# Patient Record
Sex: Male | Born: 1940 | Race: Asian | Hispanic: No | Marital: Married | Smoking: Never smoker
Health system: Southern US, Community
[De-identification: ages and names within clinical notes are randomized; demographics above are authoritative.]

## PROBLEM LIST (undated history)

## (undated) DIAGNOSIS — I639 Cerebral infarction, unspecified: Secondary | ICD-10-CM

## (undated) DIAGNOSIS — F0390 Unspecified dementia without behavioral disturbance: Secondary | ICD-10-CM

## (undated) DIAGNOSIS — J189 Pneumonia, unspecified organism: Secondary | ICD-10-CM

## (undated) DIAGNOSIS — I10 Essential (primary) hypertension: Secondary | ICD-10-CM

---

## 2007-04-07 ENCOUNTER — Encounter: Payer: Self-pay | Admitting: Physical Medicine & Rehabilitation

## 2007-04-23 ENCOUNTER — Encounter: Payer: Self-pay | Admitting: Physical Medicine & Rehabilitation

## 2007-07-31 ENCOUNTER — Encounter: Payer: Self-pay | Admitting: Internal Medicine

## 2007-08-21 ENCOUNTER — Encounter: Payer: Self-pay | Admitting: Internal Medicine

## 2018-09-14 ENCOUNTER — Telehealth: Payer: Self-pay

## 2018-09-14 ENCOUNTER — Other Ambulatory Visit: Payer: Self-pay

## 2018-09-14 DIAGNOSIS — Z20822 Contact with and (suspected) exposure to covid-19: Secondary | ICD-10-CM

## 2018-09-14 NOTE — Telephone Encounter (Signed)
Incoming call from [facility] referring Pt for Covid -19 testing.  Call to Pt. Left message for Patient to call back to schedule appointment for Covided testing.

## 2018-09-14 NOTE — Telephone Encounter (Signed)
Incoming call from wife of pt.  Schedule appointment for 1230 today Thursday 09/14/18 .  Patient wife voices understanding.

## 2018-09-18 LAB — NOVEL CORONAVIRUS, NAA: SARS-CoV-2, NAA: NOT DETECTED

## 2020-11-03 DIAGNOSIS — R682 Dry mouth, unspecified: Secondary | ICD-10-CM | POA: Diagnosis not present

## 2020-11-03 DIAGNOSIS — R634 Abnormal weight loss: Secondary | ICD-10-CM | POA: Diagnosis not present

## 2020-11-03 DIAGNOSIS — I10 Essential (primary) hypertension: Secondary | ICD-10-CM | POA: Diagnosis not present

## 2020-11-03 DIAGNOSIS — M13849 Other specified arthritis, unspecified hand: Secondary | ICD-10-CM | POA: Diagnosis not present

## 2020-11-03 DIAGNOSIS — E785 Hyperlipidemia, unspecified: Secondary | ICD-10-CM | POA: Diagnosis not present

## 2020-11-03 DIAGNOSIS — R5381 Other malaise: Secondary | ICD-10-CM | POA: Diagnosis not present

## 2020-11-03 DIAGNOSIS — K219 Gastro-esophageal reflux disease without esophagitis: Secondary | ICD-10-CM | POA: Diagnosis not present

## 2020-11-03 DIAGNOSIS — M25569 Pain in unspecified knee: Secondary | ICD-10-CM | POA: Diagnosis not present

## 2020-11-26 DIAGNOSIS — R2689 Other abnormalities of gait and mobility: Secondary | ICD-10-CM | POA: Diagnosis not present

## 2020-12-09 DIAGNOSIS — M25569 Pain in unspecified knee: Secondary | ICD-10-CM | POA: Diagnosis not present

## 2020-12-09 DIAGNOSIS — R682 Dry mouth, unspecified: Secondary | ICD-10-CM | POA: Diagnosis not present

## 2020-12-09 DIAGNOSIS — E785 Hyperlipidemia, unspecified: Secondary | ICD-10-CM | POA: Diagnosis not present

## 2020-12-09 DIAGNOSIS — R5381 Other malaise: Secondary | ICD-10-CM | POA: Diagnosis not present

## 2020-12-09 DIAGNOSIS — M13849 Other specified arthritis, unspecified hand: Secondary | ICD-10-CM | POA: Diagnosis not present

## 2020-12-09 DIAGNOSIS — I951 Orthostatic hypotension: Secondary | ICD-10-CM | POA: Diagnosis not present

## 2020-12-18 DIAGNOSIS — R2689 Other abnormalities of gait and mobility: Secondary | ICD-10-CM | POA: Diagnosis not present

## 2020-12-22 DIAGNOSIS — R2689 Other abnormalities of gait and mobility: Secondary | ICD-10-CM | POA: Diagnosis not present

## 2021-01-01 DIAGNOSIS — R2689 Other abnormalities of gait and mobility: Secondary | ICD-10-CM | POA: Diagnosis not present

## 2021-04-28 DIAGNOSIS — E785 Hyperlipidemia, unspecified: Secondary | ICD-10-CM | POA: Diagnosis not present

## 2021-04-28 DIAGNOSIS — I10 Essential (primary) hypertension: Secondary | ICD-10-CM | POA: Diagnosis not present

## 2021-05-04 DIAGNOSIS — E785 Hyperlipidemia, unspecified: Secondary | ICD-10-CM | POA: Diagnosis not present

## 2021-05-04 DIAGNOSIS — M13849 Other specified arthritis, unspecified hand: Secondary | ICD-10-CM | POA: Diagnosis not present

## 2021-05-04 DIAGNOSIS — R682 Dry mouth, unspecified: Secondary | ICD-10-CM | POA: Diagnosis not present

## 2021-05-04 DIAGNOSIS — Z0001 Encounter for general adult medical examination with abnormal findings: Secondary | ICD-10-CM | POA: Diagnosis not present

## 2021-05-04 DIAGNOSIS — R634 Abnormal weight loss: Secondary | ICD-10-CM | POA: Diagnosis not present

## 2021-05-04 DIAGNOSIS — I10 Essential (primary) hypertension: Secondary | ICD-10-CM | POA: Diagnosis not present

## 2021-05-04 DIAGNOSIS — R0602 Shortness of breath: Secondary | ICD-10-CM | POA: Diagnosis not present

## 2021-05-04 DIAGNOSIS — G3184 Mild cognitive impairment, so stated: Secondary | ICD-10-CM | POA: Diagnosis not present

## 2021-05-04 DIAGNOSIS — K219 Gastro-esophageal reflux disease without esophagitis: Secondary | ICD-10-CM | POA: Diagnosis not present

## 2021-05-04 DIAGNOSIS — R5381 Other malaise: Secondary | ICD-10-CM | POA: Diagnosis not present

## 2021-05-04 DIAGNOSIS — M25569 Pain in unspecified knee: Secondary | ICD-10-CM | POA: Diagnosis not present

## 2021-06-19 DIAGNOSIS — N3000 Acute cystitis without hematuria: Secondary | ICD-10-CM | POA: Diagnosis not present

## 2021-06-19 DIAGNOSIS — Z8673 Personal history of transient ischemic attack (TIA), and cerebral infarction without residual deficits: Secondary | ICD-10-CM | POA: Diagnosis not present

## 2021-06-19 DIAGNOSIS — E7800 Pure hypercholesterolemia, unspecified: Secondary | ICD-10-CM | POA: Diagnosis not present

## 2021-06-19 DIAGNOSIS — Z79899 Other long term (current) drug therapy: Secondary | ICD-10-CM | POA: Diagnosis not present

## 2021-06-19 DIAGNOSIS — E860 Dehydration: Secondary | ICD-10-CM | POA: Diagnosis not present

## 2021-06-19 DIAGNOSIS — I10 Essential (primary) hypertension: Secondary | ICD-10-CM | POA: Diagnosis not present

## 2021-06-20 DIAGNOSIS — I10 Essential (primary) hypertension: Secondary | ICD-10-CM | POA: Diagnosis not present

## 2021-06-20 DIAGNOSIS — R5383 Other fatigue: Secondary | ICD-10-CM | POA: Diagnosis not present

## 2021-06-20 DIAGNOSIS — R509 Fever, unspecified: Secondary | ICD-10-CM | POA: Diagnosis not present

## 2021-06-20 DIAGNOSIS — E785 Hyperlipidemia, unspecified: Secondary | ICD-10-CM | POA: Diagnosis not present

## 2021-06-20 DIAGNOSIS — E860 Dehydration: Secondary | ICD-10-CM | POA: Diagnosis not present

## 2021-06-20 DIAGNOSIS — Z8673 Personal history of transient ischemic attack (TIA), and cerebral infarction without residual deficits: Secondary | ICD-10-CM | POA: Diagnosis not present

## 2021-06-20 DIAGNOSIS — R531 Weakness: Secondary | ICD-10-CM | POA: Diagnosis not present

## 2021-06-20 DIAGNOSIS — R2681 Unsteadiness on feet: Secondary | ICD-10-CM | POA: Diagnosis not present

## 2021-06-20 DIAGNOSIS — E7800 Pure hypercholesterolemia, unspecified: Secondary | ICD-10-CM | POA: Diagnosis not present

## 2021-06-20 DIAGNOSIS — R350 Frequency of micturition: Secondary | ICD-10-CM | POA: Diagnosis not present

## 2021-06-20 DIAGNOSIS — G9340 Encephalopathy, unspecified: Secondary | ICD-10-CM | POA: Diagnosis not present

## 2021-06-20 DIAGNOSIS — Z20822 Contact with and (suspected) exposure to covid-19: Secondary | ICD-10-CM | POA: Diagnosis not present

## 2021-06-20 DIAGNOSIS — N390 Urinary tract infection, site not specified: Secondary | ICD-10-CM | POA: Diagnosis not present

## 2021-06-20 DIAGNOSIS — Z1624 Resistance to multiple antibiotics: Secondary | ICD-10-CM | POA: Diagnosis not present

## 2021-06-20 DIAGNOSIS — D72829 Elevated white blood cell count, unspecified: Secondary | ICD-10-CM | POA: Diagnosis not present

## 2021-06-21 DIAGNOSIS — E785 Hyperlipidemia, unspecified: Secondary | ICD-10-CM | POA: Diagnosis not present

## 2021-06-21 DIAGNOSIS — Z7902 Long term (current) use of antithrombotics/antiplatelets: Secondary | ICD-10-CM | POA: Diagnosis not present

## 2021-06-21 DIAGNOSIS — R509 Fever, unspecified: Secondary | ICD-10-CM | POA: Diagnosis not present

## 2021-06-21 DIAGNOSIS — I10 Essential (primary) hypertension: Secondary | ICD-10-CM | POA: Diagnosis not present

## 2021-06-21 DIAGNOSIS — D72829 Elevated white blood cell count, unspecified: Secondary | ICD-10-CM | POA: Diagnosis not present

## 2021-06-21 DIAGNOSIS — N390 Urinary tract infection, site not specified: Secondary | ICD-10-CM | POA: Diagnosis not present

## 2021-06-21 DIAGNOSIS — Z792 Long term (current) use of antibiotics: Secondary | ICD-10-CM | POA: Diagnosis not present

## 2021-06-22 DIAGNOSIS — N390 Urinary tract infection, site not specified: Secondary | ICD-10-CM | POA: Diagnosis not present

## 2021-06-24 DIAGNOSIS — R634 Abnormal weight loss: Secondary | ICD-10-CM | POA: Diagnosis not present

## 2021-06-24 DIAGNOSIS — E785 Hyperlipidemia, unspecified: Secondary | ICD-10-CM | POA: Diagnosis not present

## 2021-06-24 DIAGNOSIS — R682 Dry mouth, unspecified: Secondary | ICD-10-CM | POA: Diagnosis not present

## 2021-06-24 DIAGNOSIS — M25569 Pain in unspecified knee: Secondary | ICD-10-CM | POA: Diagnosis not present

## 2021-06-24 DIAGNOSIS — R5381 Other malaise: Secondary | ICD-10-CM | POA: Diagnosis not present

## 2021-06-24 DIAGNOSIS — M13849 Other specified arthritis, unspecified hand: Secondary | ICD-10-CM | POA: Diagnosis not present

## 2021-07-07 DIAGNOSIS — N390 Urinary tract infection, site not specified: Secondary | ICD-10-CM | POA: Diagnosis not present

## 2021-07-23 ENCOUNTER — Encounter: Payer: Self-pay | Admitting: Urology

## 2021-07-23 ENCOUNTER — Ambulatory Visit (INDEPENDENT_AMBULATORY_CARE_PROVIDER_SITE_OTHER): Payer: Medicare Other | Admitting: Urology

## 2021-07-23 VITALS — BP 148/68 | HR 72 | Ht 69.0 in | Wt 145.0 lb

## 2021-07-23 DIAGNOSIS — R350 Frequency of micturition: Secondary | ICD-10-CM | POA: Diagnosis not present

## 2021-07-23 DIAGNOSIS — N401 Enlarged prostate with lower urinary tract symptoms: Secondary | ICD-10-CM

## 2021-07-23 DIAGNOSIS — N390 Urinary tract infection, site not specified: Secondary | ICD-10-CM | POA: Diagnosis not present

## 2021-07-23 LAB — MICROSCOPIC EXAMINATION

## 2021-07-23 LAB — URINALYSIS, COMPLETE
Bilirubin, UA: NEGATIVE
Glucose, UA: NEGATIVE
Ketones, UA: NEGATIVE
Nitrite, UA: NEGATIVE
Protein,UA: NEGATIVE
RBC, UA: NEGATIVE
Specific Gravity, UA: 1.025 (ref 1.005–1.030)
Urobilinogen, Ur: 0.2 mg/dL (ref 0.2–1.0)
pH, UA: 5.5 (ref 5.0–7.5)

## 2021-07-23 MED ORDER — SULFAMETHOXAZOLE-TRIMETHOPRIM 800-160 MG PO TABS: 1.0000 | ORAL_TABLET | Freq: Two times a day (BID) | ORAL | 0 refills | Status: AC

## 2021-07-24 LAB — BLADDER SCAN AMB NON-IMAGING: Scan Result: 8

## 2021-07-24 NOTE — Progress Notes (Signed)
? ?  07/23/2021 ?6:59 AM  ? ?[name] ?Aug 07, 1940 ?[id number] ? ?Referring provider: Margaretann Loveless, MD ?[address] ?[place],  [place] [address] ? ?Chief Complaint  ?Patient presents with  ? Urinary Frequency  ? ? ?HPI: ?Derek Yates is a 81 y.o. male referred for recurrent UTI/chronic prostatitis.  He presents today with his wife ? ?Seen [facility] ED 06/20/2021 with fever, fatigue and delirium.  Had been on Keflex for a UTI with findings consistent with UTI ?Was admitted and started on IV antibiotics.  Urine culture grew E. coli resistant to ampicillin, cefazolin, Keflex ?Discharged 06/22/2021 on Septra DS ?No complaints today.  Denies bothersome lower urinary tract symptoms.  States he has mild urinary frequency ?No dysuria, gross hematuria ?Denies flank, abdominal or pelvic pain ?On tamsulosin for BPH ? ?PMH: ?Hypertension ?Hyperlipidemia ?History of CVA ? ?Surgical History: ?History reviewed. No pertinent surgical history. ? ?Home Medications:  ?Allergies as of 07/23/2021   ?No Known Allergies ?  ? ?  ?Medication List  ?  ? ?  ? Accurate as of Jul 23, 2021 11:59 PM. If you have any questions, ask your nurse or doctor.  ?  ?  ? ?  ? ?atorvastatin 10 MG tablet ?Commonly known as: LIPITOR ?Take 10 mg by mouth daily. ?  ?diazepam 2 MG tablet ?Commonly known as: VALIUM ?Take 1 tablet by mouth daily. ?  ?NIFEdipine 30 MG 24 hr tablet ?Commonly known as: PROCARDIA-XL/NIFEDICAL-XL ?Take 1 tablet by mouth daily. ?  ?pantoprazole 40 MG tablet ?Commonly known as: PROTONIX ?Take by mouth. ?  ?sulfamethoxazole-trimethoprim 800-160 MG tablet ?Commonly known as: BACTRIM DS ?Take 1 tablet by mouth 2 (two) times daily for 14 days. ?Started by: Riki Altes, MD ?  ?tamsulosin 0.4 MG Caps capsule ?Commonly known as: FLOMAX ?Take by mouth. ?  ? ?  ? ? ?Allergies: No Known Allergies ? ?Family History: ?History reviewed. No pertinent family history. ? ?Social History:  reports that he has never smoked. He has never used smokeless  tobacco. No history on file for alcohol use and drug use. ? ? ?Physical Exam: ?BP (!) 148/68   Pulse 72   Ht 5\' 9"  (1.753 m)   Wt 145 lb (65.8 kg)   BMI 21.41 kg/m?   ?Constitutional:  Alert and oriented, No acute distress. ?HEENT: [place] AT, moist mucus membranes.  Trachea midline, no masses. ?Cardiovascular: No clubbing, cyanosis, or edema. ?Respiratory: Normal respiratory effort, no increased work of breathing. ?GI: Abdomen is soft, nontender, nondistended, no abdominal masses ?GU: Prostate 50 g, smooth without nodules ?Skin: No rashes, bruises or suspicious lesions. ?Psychiatric: Normal mood and affect. ? ?Laboratory Data: ? ?Urinalysis ?Microscopy 11-30 WBC ? ? ?Assessment & Plan:   ? ?1.  Recurrent UTI ?No upper tract imaging has been performed ?UA today with pyuria ?Urine culture was ordered and will start Septra DS with recent hospitalization secondary to UTI ?Renal ultrasound ordered  ?PVR today was 8cc ? ?2.  BPH with LUTS ?No bothersome voiding symptoms on tamsulosin ? ? ? , MD ? ?[facility] ?[address], [address] ?[place], [place] ?[phone] ? ?

## 2021-07-26 LAB — CULTURE, URINE COMPREHENSIVE

## 2021-07-31 ENCOUNTER — Ambulatory Visit: Payer: Medicare Other | Admitting: Urology

## 2021-08-12 ENCOUNTER — Ambulatory Visit
Admission: RE | Admit: 2021-08-12 | Discharge: 2021-08-12 | Disposition: A | Discharge status: Home / Self Care | Payer: Medicare Other | Source: Ambulatory Visit | Attending: Urology | Admitting: Urology

## 2021-08-12 DIAGNOSIS — N390 Urinary tract infection, site not specified: Secondary | ICD-10-CM | POA: Insufficient documentation

## 2021-08-12 DIAGNOSIS — N281 Cyst of kidney, acquired: Secondary | ICD-10-CM | POA: Diagnosis not present

## 2021-08-13 ENCOUNTER — Telehealth: Payer: Self-pay | Admitting: *Deleted

## 2021-08-13 NOTE — Telephone Encounter (Signed)
-----   Message from Riki Altes, MD sent at 08/13/2021 12:35 PM EDT ----- Renal ultrasound showed no findings that would be a source of urinary tract infection

## 2021-08-19 NOTE — Telephone Encounter (Signed)
Patient notified and voiced understanding.

## 2021-10-26 DIAGNOSIS — E785 Hyperlipidemia, unspecified: Secondary | ICD-10-CM | POA: Diagnosis not present

## 2021-10-26 DIAGNOSIS — K219 Gastro-esophageal reflux disease without esophagitis: Secondary | ICD-10-CM | POA: Diagnosis not present

## 2021-10-26 DIAGNOSIS — R0602 Shortness of breath: Secondary | ICD-10-CM | POA: Diagnosis not present

## 2021-10-26 DIAGNOSIS — I10 Essential (primary) hypertension: Secondary | ICD-10-CM | POA: Diagnosis not present

## 2021-10-26 DIAGNOSIS — M13849 Other specified arthritis, unspecified hand: Secondary | ICD-10-CM | POA: Diagnosis not present

## 2021-10-26 DIAGNOSIS — R634 Abnormal weight loss: Secondary | ICD-10-CM | POA: Diagnosis not present

## 2021-10-26 DIAGNOSIS — M25569 Pain in unspecified knee: Secondary | ICD-10-CM | POA: Diagnosis not present

## 2021-10-27 DIAGNOSIS — E559 Vitamin D deficiency, unspecified: Secondary | ICD-10-CM | POA: Diagnosis not present

## 2021-10-27 DIAGNOSIS — I10 Essential (primary) hypertension: Secondary | ICD-10-CM | POA: Diagnosis not present

## 2022-03-16 DIAGNOSIS — R634 Abnormal weight loss: Secondary | ICD-10-CM | POA: Diagnosis not present

## 2022-03-16 DIAGNOSIS — M13849 Other specified arthritis, unspecified hand: Secondary | ICD-10-CM | POA: Diagnosis not present

## 2022-03-16 DIAGNOSIS — R5381 Other malaise: Secondary | ICD-10-CM | POA: Diagnosis not present

## 2022-03-16 DIAGNOSIS — M25569 Pain in unspecified knee: Secondary | ICD-10-CM | POA: Diagnosis not present

## 2022-03-16 DIAGNOSIS — I10 Essential (primary) hypertension: Secondary | ICD-10-CM | POA: Diagnosis not present

## 2022-03-16 DIAGNOSIS — Z23 Encounter for immunization: Secondary | ICD-10-CM | POA: Diagnosis not present

## 2022-03-16 DIAGNOSIS — E785 Hyperlipidemia, unspecified: Secondary | ICD-10-CM | POA: Diagnosis not present

## 2022-04-28 ENCOUNTER — Ambulatory Visit: Payer: Medicare Other | Admitting: Internal Medicine

## 2022-05-25 ENCOUNTER — Other Ambulatory Visit: Payer: Self-pay

## 2022-05-25 MED ORDER — ATORVASTATIN CALCIUM 10 MG PO TABS: 10.0000 mg | ORAL_TABLET | Freq: Every day | ORAL | 1 refills | Status: DC

## 2022-06-01 ENCOUNTER — Ambulatory Visit (INDEPENDENT_AMBULATORY_CARE_PROVIDER_SITE_OTHER): Payer: Medicare Other | Admitting: Internal Medicine

## 2022-06-01 ENCOUNTER — Encounter: Payer: Self-pay | Admitting: Internal Medicine

## 2022-06-01 VITALS — BP 136/78 | HR 77 | Ht 67.0 in | Wt 147.6 lb

## 2022-06-01 DIAGNOSIS — L03312 Cellulitis of back [any part except buttock]: Secondary | ICD-10-CM

## 2022-06-01 DIAGNOSIS — E782 Mixed hyperlipidemia: Secondary | ICD-10-CM

## 2022-06-01 DIAGNOSIS — I63519 Cerebral infarction due to unspecified occlusion or stenosis of unspecified middle cerebral artery: Secondary | ICD-10-CM

## 2022-06-01 DIAGNOSIS — D171 Benign lipomatous neoplasm of skin and subcutaneous tissue of trunk: Secondary | ICD-10-CM | POA: Diagnosis not present

## 2022-06-01 DIAGNOSIS — F419 Anxiety disorder, unspecified: Secondary | ICD-10-CM

## 2022-06-01 MED ORDER — ATORVASTATIN CALCIUM 10 MG PO TABS: 10.0000 mg | ORAL_TABLET | Freq: Every day | ORAL | 1 refills | Status: DC

## 2022-06-01 MED ORDER — DIAZEPAM 2 MG PO TABS: 2.0000 mg | ORAL_TABLET | Freq: Every day | ORAL | 2 refills | Status: DC

## 2022-06-01 MED ORDER — DOXYCYCLINE HYCLATE 100 MG PO CAPS: 100.0000 mg | ORAL_CAPSULE | Freq: Two times a day (BID) | ORAL | 0 refills | Status: AC

## 2022-06-01 NOTE — Progress Notes (Signed)
Established Patient Office Visit  Subjective:  Patient ID: Derek Yates, male    DOB: [birth year]  Age: 82 y.o. MRN: [id number]  Chief Complaint  Patient presents with   Follow-up    Follow up     No new complaints is concerned about whether he can fast and an enlarging lesion on his back.      No past medical history on file.  No past surgical history on file.  Social History   Socioeconomic History   Marital status: Married    Spouse name: Not on file   Number of children: Not on file   Years of education: Not on file   Highest education level: Not on file  Occupational History   Not on file  Tobacco Use   Smoking status: Never   Smokeless tobacco: Never  Substance and Sexual Activity   Alcohol use: Not on file   Drug use: Not on file   Sexual activity: Not on file  Other Topics Concern   Not on file  Social History Narrative   Not on file   Social Determinants of Health   Financial Resource Strain: Not on file  Food Insecurity: Not on file  Transportation Needs: Not on file  Physical Activity: Not on file  Stress: Not on file  Social Connections: Not on file  Intimate Partner Violence: Not on file    No family history on file.  No Known Allergies  Review of Systems  All other systems reviewed and are negative.      Objective:   BP 136/78   Pulse 77   Ht '5\' 7"'$  (1.702 m)   Wt 147 lb 9.6 oz (67 kg)   SpO2 96%   BMI 23.12 kg/m   Vitals:   06/01/22 1142  BP: 136/78  Pulse: 77  Height: '5\' 7"'$  (1.702 m)  Weight: 147 lb 9.6 oz (67 kg)  SpO2: 96%  BMI (Calculated): 23.11    Physical Exam Vitals reviewed.  Constitutional:      Appearance: Normal appearance.  HENT:     Head: Normocephalic.     Left Ear: There is no impacted cerumen.     Nose: Nose normal.     Mouth/Throat:     Mouth: Mucous membranes are moist.     Pharynx: No posterior oropharyngeal erythema.  Eyes:     Extraocular Movements: Extraocular movements intact.      Pupils: Pupils are equal, round, and reactive to light.  Cardiovascular:     Rate and Rhythm: Regular rhythm.     Chest Wall: PMI is not displaced.     Pulses: Normal pulses.     Heart sounds: Normal heart sounds. No murmur heard. Pulmonary:     Effort: Pulmonary effort is normal.     Breath sounds: Normal air entry. No rhonchi or rales.  Abdominal:     General: Abdomen is flat. Bowel sounds are normal. There is no distension.     Palpations: Abdomen is soft. There is no hepatomegaly, splenomegaly or mass.     Tenderness: There is no abdominal tenderness.  Musculoskeletal:        General: Normal range of motion.     Cervical back: Normal range of motion and neck supple.     Right lower leg: No edema.     Left lower leg: No edema.  Skin:    General: Skin is warm and dry.     Comments: Lipoma rather inflamed  Neurological:  General: No focal deficit present.     Mental Status: He is alert and oriented to person, place, and time.     Cranial Nerves: No cranial nerve deficit.     Motor: No weakness.  Psychiatric:        Mood and Affect: Mood normal.        Behavior: Behavior normal.      No results found for any visits on 06/01/22.  No results found for this or any previous visit (from the past 2160 hour(Jariah Jarmon)).    Assessment & Plan:   Problem List Items Addressed This Visit   None   No follow-ups on file.   Total time spent: 20 minutes  Volanda Napoleon, MD  06/01/2022

## 2022-06-10 ENCOUNTER — Other Ambulatory Visit: Payer: Self-pay | Admitting: Internal Medicine

## 2022-07-06 ENCOUNTER — Ambulatory Visit: Payer: Medicare Other | Admitting: Nurse Practitioner

## 2022-07-08 ENCOUNTER — Other Ambulatory Visit: Payer: Self-pay | Admitting: Internal Medicine

## 2022-07-08 DIAGNOSIS — F419 Anxiety disorder, unspecified: Secondary | ICD-10-CM

## 2022-07-12 ENCOUNTER — Other Ambulatory Visit: Payer: Self-pay

## 2022-07-12 DIAGNOSIS — F419 Anxiety disorder, unspecified: Secondary | ICD-10-CM

## 2022-07-13 ENCOUNTER — Other Ambulatory Visit: Payer: Self-pay | Admitting: Internal Medicine

## 2022-07-13 DIAGNOSIS — F419 Anxiety disorder, unspecified: Secondary | ICD-10-CM

## 2022-07-13 NOTE — Telephone Encounter (Signed)
Patient requesting new Rx for diazepam.

## 2022-08-17 ENCOUNTER — Ambulatory Visit (INDEPENDENT_AMBULATORY_CARE_PROVIDER_SITE_OTHER): Payer: Medicare Other | Admitting: Internal Medicine

## 2022-08-17 ENCOUNTER — Encounter: Payer: Self-pay | Admitting: Internal Medicine

## 2022-08-17 VITALS — BP 130/80 | HR 66 | Ht 69.0 in | Wt 145.6 lb

## 2022-08-17 DIAGNOSIS — M25562 Pain in left knee: Secondary | ICD-10-CM | POA: Diagnosis not present

## 2022-08-17 DIAGNOSIS — R269 Unspecified abnormalities of gait and mobility: Secondary | ICD-10-CM | POA: Diagnosis not present

## 2022-08-17 DIAGNOSIS — B351 Tinea unguium: Secondary | ICD-10-CM

## 2022-08-17 DIAGNOSIS — W19XXXA Unspecified fall, initial encounter: Secondary | ICD-10-CM | POA: Diagnosis not present

## 2022-08-17 DIAGNOSIS — I63519 Cerebral infarction due to unspecified occlusion or stenosis of unspecified middle cerebral artery: Secondary | ICD-10-CM

## 2022-08-17 DIAGNOSIS — F419 Anxiety disorder, unspecified: Secondary | ICD-10-CM

## 2022-08-17 MED ORDER — DICLOFENAC SODIUM 1 % EX GEL: 8.0000 g | Freq: Two times a day (BID) | CUTANEOUS | 2 refills | Status: DC

## 2022-08-17 MED ORDER — DIAZEPAM 2 MG PO TABS: 2.0000 mg | ORAL_TABLET | Freq: Every day | ORAL | 0 refills | Status: DC

## 2022-08-17 MED ORDER — TERBINAFINE HCL 250 MG PO TABS: 250.0000 mg | ORAL_TABLET | Freq: Every day | ORAL | 0 refills | Status: AC

## 2022-08-17 NOTE — Progress Notes (Signed)
Established Patient Office Visit  Subjective:  Patient ID: Derek Yates, male    DOB: 1940-03-24  Age: 82 y.o. MRN: [id number]  Chief Complaint  Patient presents with   Follow-up    3 month follow up, discuss lab results.    C/o loss of balance and worsening left knee pain which is slightly ameloriated by knee sleeve. Also c/o memory loss. Denies any headaches or dizzziness. Larey Seat last week while trying to get out of bed and grazed his right elbow. C/o itching between his toes.     No other concerns at this time.   No past medical history on file.  No past surgical history on file.  Social History   Socioeconomic History   Marital status: Married    Spouse name: Not on file   Number of children: Not on file   Years of education: Not on file   Highest education level: Not on file  Occupational History   Not on file  Tobacco Use   Smoking status: Never   Smokeless tobacco: Never  Substance and Sexual Activity   Alcohol use: Not on file   Drug use: Not on file   Sexual activity: Not on file  Other Topics Concern   Not on file  Social History Narrative   Not on file   Social Determinants of Health   Financial Resource Strain: Not on file  Food Insecurity: Not on file  Transportation Needs: Not on file  Physical Activity: Not on file  Stress: Not on file  Social Connections: Not on file  Intimate Partner Violence: Not on file    No family history on file.  No Known Allergies  Review of Systems  All other systems reviewed and are negative.      Objective:   Pulse 66   Ht 5\' 9"  (1.753 m)   Wt 145 lb 9.6 oz (66 kg)   SpO2 95%   BMI 21.50 kg/m   Vitals:   08/17/22 1518  Pulse: 66  Height: 5\' 9"  (1.753 m)  Weight: 145 lb 9.6 oz (66 kg)  SpO2: 95%  BMI (Calculated): 21.49    Physical Exam Vitals reviewed.  Constitutional:      Appearance: Normal appearance.  HENT:     Head: Normocephalic.     Left Ear: There is no impacted cerumen.      Nose: Nose normal.     Mouth/Throat:     Mouth: Mucous membranes are moist.     Pharynx: No posterior oropharyngeal erythema.  Eyes:     Extraocular Movements: Extraocular movements intact.     Pupils: Pupils are equal, round, and reactive to light.  Cardiovascular:     Rate and Rhythm: Regular rhythm.     Chest Wall: PMI is not displaced.     Pulses: Normal pulses.     Heart sounds: Normal heart sounds. No murmur heard. Pulmonary:     Effort: Pulmonary effort is normal.     Breath sounds: Normal air entry. No rhonchi or rales.  Abdominal:     General: Abdomen is flat. Bowel sounds are normal. There is no distension.     Palpations: Abdomen is soft. There is no hepatomegaly, splenomegaly or mass.     Tenderness: There is no abdominal tenderness.  Musculoskeletal:        General: Normal range of motion.     Cervical back: Normal range of motion and neck supple.     Right lower leg: No edema.  Left lower leg: No edema.  Skin:    General: Skin is warm and dry.     Findings: Rash present. Rash is papular.     Comments: Purpura on right arm  Neurological:     General: No focal deficit present.     Mental Status: He is alert and oriented to person, place, and time.     Cranial Nerves: No cranial nerve deficit.     Motor: No weakness.  Psychiatric:        Mood and Affect: Mood normal.        Behavior: Behavior normal.      No results found for any visits on 08/17/22.  No results found for this or any previous visit (from the past 2160 hour(Marinell Igarashi)).    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   Total time spent: 20 minutes  Luna Fuse, MD  08/17/2022   This document may have been prepared by [facility] Voice Recognition software and as such may include unintentional dictation errors.

## 2022-08-19 ENCOUNTER — Ambulatory Visit (INDEPENDENT_AMBULATORY_CARE_PROVIDER_SITE_OTHER): Payer: Medicare Other

## 2022-08-19 ENCOUNTER — Other Ambulatory Visit: Payer: Medicare Other

## 2022-08-19 ENCOUNTER — Ambulatory Visit: Payer: Medicare Other

## 2022-08-19 DIAGNOSIS — M25561 Pain in right knee: Secondary | ICD-10-CM

## 2022-08-19 DIAGNOSIS — M25562 Pain in left knee: Secondary | ICD-10-CM | POA: Diagnosis not present

## 2022-08-25 ENCOUNTER — Telehealth: Payer: Self-pay | Admitting: Internal Medicine

## 2022-08-25 NOTE — Progress Notes (Signed)
Patient notified

## 2022-08-25 NOTE — Telephone Encounter (Signed)
Spoke with patient and he is wanting to do Home Health PT for his knees. Please send referral to [facility] for PT.

## 2022-08-27 ENCOUNTER — Telehealth: Payer: Self-pay

## 2022-08-27 NOTE — Telephone Encounter (Signed)
Patient Derek Yates stating that the [facility] agency can not see his order for PT

## 2022-08-31 NOTE — Telephone Encounter (Signed)
Gave patient and his wife the contact information to be able to get the hh set up

## 2022-09-01 ENCOUNTER — Ambulatory Visit: Payer: Medicare Other | Admitting: Internal Medicine

## 2022-09-10 ENCOUNTER — Telehealth: Payer: Self-pay

## 2022-09-10 DIAGNOSIS — R269 Unspecified abnormalities of gait and mobility: Secondary | ICD-10-CM

## 2022-09-10 NOTE — Telephone Encounter (Signed)
Patient son called asking that you call him back directly states that your family friends and he's got a couple questions  [phone]

## 2022-09-13 NOTE — Addendum Note (Signed)
Addended by: Aundria Mems AHMAD on: 09/13/2022 04:46 PM   Modules accepted: Orders

## 2022-09-16 DIAGNOSIS — R2689 Other abnormalities of gait and mobility: Secondary | ICD-10-CM | POA: Diagnosis not present

## 2022-09-16 DIAGNOSIS — I10 Essential (primary) hypertension: Secondary | ICD-10-CM | POA: Diagnosis not present

## 2022-09-16 DIAGNOSIS — Z9181 History of falling: Secondary | ICD-10-CM | POA: Diagnosis not present

## 2022-09-16 DIAGNOSIS — I69398 Other sequelae of cerebral infarction: Secondary | ICD-10-CM | POA: Diagnosis not present

## 2022-09-16 DIAGNOSIS — E785 Hyperlipidemia, unspecified: Secondary | ICD-10-CM | POA: Diagnosis not present

## 2022-09-21 DIAGNOSIS — Z9181 History of falling: Secondary | ICD-10-CM | POA: Diagnosis not present

## 2022-09-21 DIAGNOSIS — I69398 Other sequelae of cerebral infarction: Secondary | ICD-10-CM | POA: Diagnosis not present

## 2022-09-21 DIAGNOSIS — R2689 Other abnormalities of gait and mobility: Secondary | ICD-10-CM | POA: Diagnosis not present

## 2022-09-21 DIAGNOSIS — I10 Essential (primary) hypertension: Secondary | ICD-10-CM | POA: Diagnosis not present

## 2022-09-21 DIAGNOSIS — E785 Hyperlipidemia, unspecified: Secondary | ICD-10-CM | POA: Diagnosis not present

## 2022-09-27 DIAGNOSIS — I10 Essential (primary) hypertension: Secondary | ICD-10-CM | POA: Diagnosis not present

## 2022-09-27 DIAGNOSIS — Z9181 History of falling: Secondary | ICD-10-CM | POA: Diagnosis not present

## 2022-09-27 DIAGNOSIS — I69398 Other sequelae of cerebral infarction: Secondary | ICD-10-CM | POA: Diagnosis not present

## 2022-09-27 DIAGNOSIS — R2689 Other abnormalities of gait and mobility: Secondary | ICD-10-CM | POA: Diagnosis not present

## 2022-09-27 DIAGNOSIS — E785 Hyperlipidemia, unspecified: Secondary | ICD-10-CM | POA: Diagnosis not present

## 2022-09-29 ENCOUNTER — Ambulatory Visit: Payer: Medicare Other | Admitting: Internal Medicine

## 2022-09-29 ENCOUNTER — Telehealth: Payer: Self-pay | Admitting: Internal Medicine

## 2022-09-29 NOTE — Telephone Encounter (Signed)
Patient called to cancel appt for this morning.

## 2022-10-01 DIAGNOSIS — I69398 Other sequelae of cerebral infarction: Secondary | ICD-10-CM | POA: Diagnosis not present

## 2022-10-01 DIAGNOSIS — R2689 Other abnormalities of gait and mobility: Secondary | ICD-10-CM | POA: Diagnosis not present

## 2022-10-01 DIAGNOSIS — I10 Essential (primary) hypertension: Secondary | ICD-10-CM | POA: Diagnosis not present

## 2022-10-01 DIAGNOSIS — Z9181 History of falling: Secondary | ICD-10-CM | POA: Diagnosis not present

## 2022-10-01 DIAGNOSIS — E785 Hyperlipidemia, unspecified: Secondary | ICD-10-CM | POA: Diagnosis not present

## 2022-10-05 DIAGNOSIS — R2689 Other abnormalities of gait and mobility: Secondary | ICD-10-CM | POA: Diagnosis not present

## 2022-10-05 DIAGNOSIS — E785 Hyperlipidemia, unspecified: Secondary | ICD-10-CM | POA: Diagnosis not present

## 2022-10-05 DIAGNOSIS — I69398 Other sequelae of cerebral infarction: Secondary | ICD-10-CM | POA: Diagnosis not present

## 2022-10-05 DIAGNOSIS — Z9181 History of falling: Secondary | ICD-10-CM | POA: Diagnosis not present

## 2022-10-05 DIAGNOSIS — I10 Essential (primary) hypertension: Secondary | ICD-10-CM | POA: Diagnosis not present

## 2022-10-07 DIAGNOSIS — Z9181 History of falling: Secondary | ICD-10-CM | POA: Diagnosis not present

## 2022-10-07 DIAGNOSIS — I10 Essential (primary) hypertension: Secondary | ICD-10-CM | POA: Diagnosis not present

## 2022-10-07 DIAGNOSIS — E785 Hyperlipidemia, unspecified: Secondary | ICD-10-CM | POA: Diagnosis not present

## 2022-10-07 DIAGNOSIS — R2689 Other abnormalities of gait and mobility: Secondary | ICD-10-CM | POA: Diagnosis not present

## 2022-10-07 DIAGNOSIS — I69398 Other sequelae of cerebral infarction: Secondary | ICD-10-CM | POA: Diagnosis not present

## 2022-10-11 DIAGNOSIS — R2689 Other abnormalities of gait and mobility: Secondary | ICD-10-CM | POA: Diagnosis not present

## 2022-10-11 DIAGNOSIS — I69398 Other sequelae of cerebral infarction: Secondary | ICD-10-CM | POA: Diagnosis not present

## 2022-10-11 DIAGNOSIS — I10 Essential (primary) hypertension: Secondary | ICD-10-CM | POA: Diagnosis not present

## 2022-10-11 DIAGNOSIS — Z9181 History of falling: Secondary | ICD-10-CM | POA: Diagnosis not present

## 2022-10-11 DIAGNOSIS — E785 Hyperlipidemia, unspecified: Secondary | ICD-10-CM | POA: Diagnosis not present

## 2022-10-19 DIAGNOSIS — E785 Hyperlipidemia, unspecified: Secondary | ICD-10-CM | POA: Diagnosis not present

## 2022-10-19 DIAGNOSIS — R2689 Other abnormalities of gait and mobility: Secondary | ICD-10-CM | POA: Diagnosis not present

## 2022-10-19 DIAGNOSIS — Z9181 History of falling: Secondary | ICD-10-CM | POA: Diagnosis not present

## 2022-10-19 DIAGNOSIS — I10 Essential (primary) hypertension: Secondary | ICD-10-CM | POA: Diagnosis not present

## 2022-10-19 DIAGNOSIS — I69398 Other sequelae of cerebral infarction: Secondary | ICD-10-CM | POA: Diagnosis not present

## 2022-10-26 DIAGNOSIS — I10 Essential (primary) hypertension: Secondary | ICD-10-CM | POA: Diagnosis not present

## 2022-10-26 DIAGNOSIS — R2689 Other abnormalities of gait and mobility: Secondary | ICD-10-CM | POA: Diagnosis not present

## 2022-10-26 DIAGNOSIS — Z9181 History of falling: Secondary | ICD-10-CM | POA: Diagnosis not present

## 2022-10-26 DIAGNOSIS — E785 Hyperlipidemia, unspecified: Secondary | ICD-10-CM | POA: Diagnosis not present

## 2022-10-26 DIAGNOSIS — I69398 Other sequelae of cerebral infarction: Secondary | ICD-10-CM | POA: Diagnosis not present

## 2022-11-02 DIAGNOSIS — I69398 Other sequelae of cerebral infarction: Secondary | ICD-10-CM | POA: Diagnosis not present

## 2022-11-02 DIAGNOSIS — R2689 Other abnormalities of gait and mobility: Secondary | ICD-10-CM | POA: Diagnosis not present

## 2022-11-02 DIAGNOSIS — I10 Essential (primary) hypertension: Secondary | ICD-10-CM | POA: Diagnosis not present

## 2022-11-02 DIAGNOSIS — Z9181 History of falling: Secondary | ICD-10-CM | POA: Diagnosis not present

## 2022-11-02 DIAGNOSIS — E785 Hyperlipidemia, unspecified: Secondary | ICD-10-CM | POA: Diagnosis not present

## 2022-11-10 DIAGNOSIS — I69398 Other sequelae of cerebral infarction: Secondary | ICD-10-CM | POA: Diagnosis not present

## 2022-11-10 DIAGNOSIS — I10 Essential (primary) hypertension: Secondary | ICD-10-CM | POA: Diagnosis not present

## 2022-11-10 DIAGNOSIS — E785 Hyperlipidemia, unspecified: Secondary | ICD-10-CM | POA: Diagnosis not present

## 2022-11-10 DIAGNOSIS — R2689 Other abnormalities of gait and mobility: Secondary | ICD-10-CM | POA: Diagnosis not present

## 2022-11-10 DIAGNOSIS — Z9181 History of falling: Secondary | ICD-10-CM | POA: Diagnosis not present

## 2022-11-16 DIAGNOSIS — Z9181 History of falling: Secondary | ICD-10-CM | POA: Diagnosis not present

## 2022-11-16 DIAGNOSIS — I69398 Other sequelae of cerebral infarction: Secondary | ICD-10-CM | POA: Diagnosis not present

## 2022-11-16 DIAGNOSIS — R2689 Other abnormalities of gait and mobility: Secondary | ICD-10-CM | POA: Diagnosis not present

## 2022-11-16 DIAGNOSIS — I10 Essential (primary) hypertension: Secondary | ICD-10-CM | POA: Diagnosis not present

## 2022-11-16 DIAGNOSIS — E785 Hyperlipidemia, unspecified: Secondary | ICD-10-CM | POA: Diagnosis not present

## 2022-11-17 ENCOUNTER — Other Ambulatory Visit: Payer: Self-pay | Admitting: Internal Medicine

## 2022-11-17 DIAGNOSIS — E782 Mixed hyperlipidemia: Secondary | ICD-10-CM

## 2022-11-23 DIAGNOSIS — R2689 Other abnormalities of gait and mobility: Secondary | ICD-10-CM | POA: Diagnosis not present

## 2022-11-23 DIAGNOSIS — I69398 Other sequelae of cerebral infarction: Secondary | ICD-10-CM | POA: Diagnosis not present

## 2022-11-23 DIAGNOSIS — I10 Essential (primary) hypertension: Secondary | ICD-10-CM | POA: Diagnosis not present

## 2022-11-23 DIAGNOSIS — Z9181 History of falling: Secondary | ICD-10-CM | POA: Diagnosis not present

## 2022-11-23 DIAGNOSIS — E785 Hyperlipidemia, unspecified: Secondary | ICD-10-CM | POA: Diagnosis not present

## 2023-01-08 ENCOUNTER — Other Ambulatory Visit: Payer: Self-pay | Admitting: Internal Medicine

## 2023-01-08 DIAGNOSIS — F419 Anxiety disorder, unspecified: Secondary | ICD-10-CM

## 2023-01-11 ENCOUNTER — Encounter: Payer: Self-pay | Admitting: Internal Medicine

## 2023-01-11 ENCOUNTER — Ambulatory Visit (INDEPENDENT_AMBULATORY_CARE_PROVIDER_SITE_OTHER): Payer: Medicare Other | Admitting: Internal Medicine

## 2023-01-11 VITALS — BP 121/78 | HR 70 | Ht 69.0 in | Wt 147.4 lb

## 2023-01-11 DIAGNOSIS — Z23 Encounter for immunization: Secondary | ICD-10-CM

## 2023-01-11 DIAGNOSIS — I63519 Cerebral infarction due to unspecified occlusion or stenosis of unspecified middle cerebral artery: Secondary | ICD-10-CM

## 2023-01-11 NOTE — Progress Notes (Signed)
Established Patient Office Visit  Subjective:  Patient ID: Derek Yates, male    DOB: [birth year]  Age: 82 y.o. MRN: [id number]  Chief Complaint  Patient presents with   Follow-up    Memory loss    No new complaints, here for lab review and medication refills. Family concerned about his memory. Still report poor balance and failed to improve Margia Wiesen/p Home PT.    No other concerns at this time.   No past medical history on file.  No past surgical history on file.  Social History   Socioeconomic History   Marital status: Married    Spouse name: Not on file   Number of children: Not on file   Years of education: Not on file   Highest education level: Not on file  Occupational History   Not on file  Tobacco Use   Smoking status: Never   Smokeless tobacco: Never  Substance and Sexual Activity   Alcohol use: Not on file   Drug use: Not on file   Sexual activity: Not on file  Other Topics Concern   Not on file  Social History Narrative   Not on file   Social Determinants of Health   Financial Resource Strain: Low Risk  (06/20/2021)   Received from [facility], [facility]   Overall Financial Resource Strain (CARDIA)    Difficulty of Paying Living Expenses: Not hard at all  Food Insecurity: No Food Insecurity (06/20/2021)   Received from [facility], [facility]   Hunger Vital Sign    Worried About Running Out of Food in the Last Year: Never true    Ran Out of Food in the Last Year: Never true  Transportation Needs: No Transportation Needs (06/20/2021)   Received from [facility], [facility]   [facility] - Transportation    Lack of Transportation (Medical): No    Lack of Transportation (Non-Medical): No  Physical Activity: Not on file  Stress: Not on file  Social Connections: Not on file  Intimate Partner Violence: Not on file    No family history on file.  No Known Allergies  Review of Systems  Psychiatric/Behavioral:  Positive for memory  loss.   All other systems reviewed and are negative.      Objective:   BP 121/78   Pulse 70   Ht 5\' 9"  (1.753 m)   Wt 147 lb 6.4 oz (66.9 kg)   SpO2 96%   BMI 21.77 kg/m   Vitals:   01/11/23 1430  BP: 121/78  Pulse: 70  Height: 5\' 9"  (1.753 m)  Weight: 147 lb 6.4 oz (66.9 kg)  SpO2: 96%  BMI (Calculated): 21.76    Physical Exam Vitals reviewed.  Constitutional:      Appearance: Normal appearance.  HENT:     Head: Normocephalic.     Left Ear: There is no impacted cerumen.     Nose: Nose normal.     Mouth/Throat:     Mouth: Mucous membranes are moist.     Pharynx: No posterior oropharyngeal erythema.  Eyes:     Extraocular Movements: Extraocular movements intact.     Pupils: Pupils are equal, round, and reactive to light.  Cardiovascular:     Rate and Rhythm: Regular rhythm.     Chest Wall: PMI is not displaced.     Pulses: Normal pulses.     Heart sounds: Normal heart sounds. No murmur heard. Pulmonary:     Effort:  Pulmonary effort is normal.     Breath sounds: Normal air entry. No rhonchi or rales.  Abdominal:     General: Abdomen is flat. Bowel sounds are normal. There is no distension.     Palpations: Abdomen is soft. There is no hepatomegaly, splenomegaly or mass.     Tenderness: There is no abdominal tenderness.  Musculoskeletal:        General: Normal range of motion.     Cervical back: Normal range of motion and neck supple.     Right lower leg: No edema.     Left lower leg: No edema.  Skin:    General: Skin is warm and dry.     Findings: Rash present. Rash is papular.     Comments: Purpura on right arm  Neurological:     General: No focal deficit present.     Mental Status: He is alert and oriented to person, place, and time.     Cranial Nerves: No cranial nerve deficit.     Motor: No weakness.  Psychiatric:        Mood and Affect: Mood normal.        Behavior: Behavior normal.      No results found for any visits on 01/11/23.  No  results found for this or any previous visit (from the past 2160 hour(Cray Monnin)).    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   Total time spent: 20 minutes  Luna Fuse, MD  01/11/2023   This document may have been prepared by [facility] Voice Recognition software and as such may include unintentional dictation errors.

## 2023-01-11 NOTE — Addendum Note (Signed)
Addended by: Hassell Halim on: 01/11/2023 03:11 PM   Modules accepted: Orders

## 2023-02-03 ENCOUNTER — Other Ambulatory Visit: Payer: Medicare Other

## 2023-02-03 ENCOUNTER — Other Ambulatory Visit: Payer: Self-pay

## 2023-02-03 DIAGNOSIS — I63519 Cerebral infarction due to unspecified occlusion or stenosis of unspecified middle cerebral artery: Secondary | ICD-10-CM | POA: Diagnosis not present

## 2023-02-04 LAB — CBC WITH DIFF/PLATELET
Basophils Absolute: 0.1 10*3/uL (ref 0.0–0.2)
Basos: 1 %
EOS (ABSOLUTE): 0.3 10*3/uL (ref 0.0–0.4)
Eos: 4 %
Hematocrit: 39.9 % (ref 37.5–51.0)
Hemoglobin: 13.3 g/dL (ref 13.0–17.7)
Immature Grans (Abs): 0 10*3/uL (ref 0.0–0.1)
Immature Granulocytes: 0 %
Lymphocytes Absolute: 2.6 10*3/uL (ref 0.7–3.1)
Lymphs: 37 %
MCH: 29 pg (ref 26.6–33.0)
MCHC: 33.3 g/dL (ref 31.5–35.7)
MCV: 87 fL (ref 79–97)
Monocytes Absolute: 0.7 10*3/uL (ref 0.1–0.9)
Monocytes: 10 %
Neutrophils Absolute: 3.5 10*3/uL (ref 1.4–7.0)
Neutrophils: 48 %
Platelets: 271 10*3/uL (ref 150–450)
RBC: 4.59 x10E6/uL (ref 4.14–5.80)
RDW: 13.2 % (ref 11.6–15.4)
WBC: 7.1 10*3/uL (ref 3.4–10.8)

## 2023-02-04 LAB — LIPID PANEL
Chol/HDL Ratio: 2.5 ratio (ref 0.0–5.0)
Cholesterol, Total: 143 mg/dL (ref 100–199)
HDL: 57 mg/dL (ref >39)
LDL Chol Calc (NIH): 66 mg/dL (ref 0–99)
Triglycerides: 110 mg/dL (ref 0–149)
VLDL Cholesterol Cal: 20 mg/dL (ref 5–40)

## 2023-02-04 LAB — COMPREHENSIVE METABOLIC PANEL
ALT: 14 [IU]/L (ref 0–44)
AST: 18 [IU]/L (ref 0–40)
Albumin: 4.5 g/dL (ref 3.7–4.7)
Alkaline Phosphatase: 96 [IU]/L (ref 44–121)
BUN/Creatinine Ratio: 15 (ref 10–24)
BUN: 15 mg/dL (ref 8–27)
Bilirubin Total: 0.5 mg/dL (ref 0.0–1.2)
CO2: 25 mmol/L (ref 20–29)
Calcium: 9.8 mg/dL (ref 8.6–10.2)
Chloride: 102 mmol/L (ref 96–106)
Creatinine, Ser: 0.98 mg/dL (ref 0.76–1.27)
Globulin, Total: 2.9 g/dL (ref 1.5–4.5)
Glucose: 89 mg/dL (ref 70–99)
Potassium: 4.1 mmol/L (ref 3.5–5.2)
Sodium: 143 mmol/L (ref 134–144)
Total Protein: 7.4 g/dL (ref 6.0–8.5)
eGFR: 76 mL/min/{1.73_m2} (ref >59)

## 2023-02-07 ENCOUNTER — Ambulatory Visit (INDEPENDENT_AMBULATORY_CARE_PROVIDER_SITE_OTHER): Payer: Medicare Other | Admitting: Internal Medicine

## 2023-02-07 ENCOUNTER — Encounter: Payer: Self-pay | Admitting: Internal Medicine

## 2023-02-07 VITALS — BP 130/75 | HR 75 | Ht 68.0 in | Wt 146.6 lb

## 2023-02-07 DIAGNOSIS — G3184 Mild cognitive impairment, so stated: Secondary | ICD-10-CM | POA: Diagnosis not present

## 2023-02-07 DIAGNOSIS — Z0001 Encounter for general adult medical examination with abnormal findings: Secondary | ICD-10-CM | POA: Diagnosis not present

## 2023-02-07 DIAGNOSIS — Z0130 Encounter for examination of blood pressure without abnormal findings: Secondary | ICD-10-CM

## 2023-02-07 DIAGNOSIS — M25562 Pain in left knee: Secondary | ICD-10-CM

## 2023-02-07 MED ORDER — DICLOFENAC SODIUM 1 % EX GEL: 8.0000 g | Freq: Two times a day (BID) | CUTANEOUS | 2 refills | Status: DC

## 2023-02-07 NOTE — Progress Notes (Signed)
Established Patient Office Visit  Subjective:  Patient ID: Derek Yates, male    DOB: 1940-06-14  Age: 82 y.o. MRN: [id number]  Chief Complaint  Patient presents with   Annual Exam    Pain in both knees Memory loss    No new complaints, here for AWV refer to quality metrics and scanned documents.   Wife and patient report deteriorating memory, he is [place] in 2-3 w[place] and would like to start medication before then if necessary. Labs reviewed and notable for well controlled lipids and unremarkable cbc and cmp.  No other concerns at this time.   No past medical history on file.  No past surgical history on file.  Social History   Socioeconomic History   Marital status: Married    Spouse name: Not on file   Number of children: Not on file   Years of education: Not on file   Highest education level: Not on file  Occupational History   Not on file  Tobacco Use   Smoking status: Never   Smokeless tobacco: Never  Substance and Sexual Activity   Alcohol use: Not on file   Drug use: Not on file   Sexual activity: Not on file  Other Topics Concern   Not on file  Social History Narrative   Not on file   Social Determinants of Health   Financial Resource Strain: Low Risk  (06/20/2021)   Received from [facility], [facility]   Overall Financial Resource Strain (CARDIA)    Difficulty of Paying Living Expenses: Not hard at all  Food Insecurity: No Food Insecurity (06/20/2021)   Received from [facility], [facility]   Hunger Vital Sign    Worried About Running Out of Food in the Last Year: Never true    Ran Out of Food in the Last Year: Never true  Transportation Needs: No Transportation Needs (06/20/2021)   Received from [facility], [facility]   [facility] - Transportation    Lack of Transportation (Medical): No    Lack of Transportation (Non-Medical): No  Physical Activity: Not on file  Stress: Not on file  Social Connections: Not on  file  Intimate Partner Violence: Not on file    No family history on file.  No Known Allergies  Outpatient Medications Prior to Visit  Medication Sig   atorvastatin (LIPITOR) 10 MG tablet TAKE 1 TABLET BY MOUTH DAILY   diazepam (VALIUM) 2 MG tablet TAKE 1 TABLET BY MOUTH DAILY   NIFEdipine (PROCARDIA-XL/NIFEDICAL-XL) 30 MG 24 hr tablet TAKE 1 TABLET BY MOUTH IN THE  MORNING   pantoprazole (PROTONIX) 40 MG tablet Take by mouth. (Patient not taking: Reported on 06/01/2022)   tamsulosin (FLOMAX) 0.4 MG CAPS capsule Take by mouth. (Patient not taking: Reported on 06/01/2022)   No facility-administered medications prior to visit.    Review of Systems  Psychiatric/Behavioral:  Positive for memory loss.   All other systems reviewed and are negative.      Objective:   BP 130/75   Pulse 75   Ht 5\' 8"  (1.727 m)   Wt 146 lb 9.6 oz (66.5 kg)   SpO2 95%   BMI 22.29 kg/m   Vitals:   02/07/23 1343  BP: 130/75  Pulse: 75  Height: 5\' 8"  (1.727 m)  Weight: 146 lb 9.6 oz (66.5 kg)  SpO2: 95%  BMI (Calculated): 22.3    Physical Exam Vitals reviewed.  Constitutional:  Appearance: Normal appearance.  HENT:     Head: Normocephalic.     Left Ear: There is no impacted cerumen.     Nose: Nose normal.     Mouth/Throat:     Mouth: Mucous membranes are moist.     Pharynx: No posterior oropharyngeal erythema.  Eyes:     Extraocular Movements: Extraocular movements intact.     Pupils: Pupils are equal, round, and reactive to light.  Cardiovascular:     Rate and Rhythm: Regular rhythm.     Chest Wall: PMI is not displaced.     Pulses: Normal pulses.     Heart sounds: Normal heart sounds. No murmur heard. Pulmonary:     Effort: Pulmonary effort is normal.     Breath sounds: Normal air entry. No rhonchi or rales.  Abdominal:     General: Abdomen is flat. Bowel sounds are normal. There is no distension.     Palpations: Abdomen is soft. There is no hepatomegaly, splenomegaly or  mass.     Tenderness: There is no abdominal tenderness.  Musculoskeletal:        General: Normal range of motion.     Cervical back: Normal range of motion and neck supple.     Right lower leg: No edema.     Left lower leg: No edema.  Skin:    General: Skin is warm and dry.     Findings: Rash present. Rash is papular.     Comments: Purpura on right arm  Neurological:     General: No focal deficit present.     Mental Status: He is alert and oriented to person, place, and time.     Cranial Nerves: No cranial nerve deficit.     Motor: No weakness.  Psychiatric:        Mood and Affect: Mood normal.        Behavior: Behavior normal.      No results found for any visits on 02/07/23.  Recent Results (from the past 2160 hour(Leland Staszewski))  CBC With Diff/Platelet     Status: None   Collection Time: 02/03/23  8:52 AM  Result Value Ref Range   WBC 7.1 3.4 - 10.8 x10E3/uL   RBC 4.59 4.14 - 5.80 x10E6/uL   Hemoglobin 13.3 13.0 - 17.7 g/dL   Hematocrit 75.6 43.3 - 51.0 %   MCV 87 79 - 97 fL   MCH 29.0 26.6 - 33.0 pg   MCHC 33.3 31.5 - 35.7 g/dL   RDW 29.5 18.8 - 41.6 %   Platelets 271 150 - 450 x10E3/uL   Neutrophils 48 Not Estab. %   Lymphs 37 Not Estab. %   Monocytes 10 Not Estab. %   Eos 4 Not Estab. %   Basos 1 Not Estab. %   Neutrophils Absolute 3.5 1.4 - 7.0 x10E3/uL   Lymphocytes Absolute 2.6 0.7 - 3.1 x10E3/uL   Monocytes Absolute 0.7 0.1 - 0.9 x10E3/uL   EOS (ABSOLUTE) 0.3 0.0 - 0.4 x10E3/uL   Basophils Absolute 0.1 0.0 - 0.2 x10E3/uL   Immature Granulocytes 0 Not Estab. %   Immature Grans (Abs) 0.0 0.0 - 0.1 x10E3/uL  Lipid panel     Status: None   Collection Time: 02/03/23  8:52 AM  Result Value Ref Range   Cholesterol, Total 143 100 - 199 mg/dL   Triglycerides 606 0 - 149 mg/dL   HDL 57 >30 mg/dL   VLDL Cholesterol Cal 20 5 - 40 mg/dL   LDL Chol Calc (  NIH) 66 0 - 99 mg/dL   Chol/HDL Ratio 2.5 0.0 - 5.0 ratio    Comment:                                   T. Chol/HDL  Ratio                                             Men  Women                               1/2 Avg.Risk  3.4    3.3                                   Avg.Risk  5.0    4.4                                2X Avg.Risk  9.6    7.1                                3X Avg.Risk 23.4   11.0   Comprehensive metabolic panel     Status: None   Collection Time: 02/03/23  8:52 AM  Result Value Ref Range   Glucose 89 70 - 99 mg/dL   BUN 15 8 - 27 mg/dL   Creatinine, Ser 1.61 0.76 - 1.27 mg/dL   eGFR 76 >09 UE/AVW/0.98   BUN/Creatinine Ratio 15 10 - 24   Sodium 143 134 - 144 mmol/L   Potassium 4.1 3.5 - 5.2 mmol/L   Chloride 102 96 - 106 mmol/L   CO2 25 20 - 29 mmol/L   Calcium 9.8 8.6 - 10.2 mg/dL   Total Protein 7.4 6.0 - 8.5 g/dL   Albumin 4.5 3.7 - 4.7 g/dL   Globulin, Total 2.9 1.5 - 4.5 g/dL   Bilirubin Total 0.5 0.0 - 1.2 mg/dL   Alkaline Phosphatase 96 44 - 121 IU/L   AST 18 0 - 40 IU/L   ALT 14 0 - 44 IU/L      Assessment & Plan:  As per problem list  Problem List Items Addressed This Visit   None Visit Diagnoses     MCI (mild cognitive impairment)    -  Primary       Return in about 1 week (around 02/14/2023) for memory eval.   Total time spent: 30 minutes  Luna Fuse, MD  02/07/2023   This document may have been prepared by Dragon Voice Recognition software and as such may include unintentional dictation errors.

## 2023-02-09 ENCOUNTER — Ambulatory Visit (INDEPENDENT_AMBULATORY_CARE_PROVIDER_SITE_OTHER): Payer: Medicare Other | Admitting: Internal Medicine

## 2023-02-09 ENCOUNTER — Encounter: Payer: Self-pay | Admitting: Internal Medicine

## 2023-02-09 VITALS — BP 128/78 | HR 69 | Ht 68.0 in | Wt 147.4 lb

## 2023-02-09 DIAGNOSIS — N3000 Acute cystitis without hematuria: Secondary | ICD-10-CM

## 2023-02-09 DIAGNOSIS — Z0130 Encounter for examination of blood pressure without abnormal findings: Secondary | ICD-10-CM

## 2023-02-09 DIAGNOSIS — G3184 Mild cognitive impairment, so stated: Secondary | ICD-10-CM | POA: Diagnosis not present

## 2023-02-09 LAB — POCT URINALYSIS DIPSTICK
Bilirubin, UA: NEGATIVE
Blood, UA: NEGATIVE
Glucose, UA: NEGATIVE
Ketones, UA: NEGATIVE
Leukocytes, UA: NEGATIVE
Nitrite, UA: NEGATIVE
Protein, UA: NEGATIVE
Spec Grav, UA: 1.015 (ref 1.010–1.025)
Urobilinogen, UA: 0.2 U/dL
pH, UA: 6 (ref 5.0–8.0)

## 2023-02-09 NOTE — Progress Notes (Signed)
Established Patient Office Visit  Subjective:  Patient ID: Derek Yates, male    DOB: 09-27-1940  Age: 82 y.o. MRN: [id number]  Chief Complaint  Patient presents with   Acute Visit    UTI   Urinary Tract Infection    Concerned about UTI due to memory loss but UA normal.    No other concerns at this time.   No past medical history on file.  No past surgical history on file.  Social History   Socioeconomic History   Marital status: Married    Spouse name: Not on file   Number of children: Not on file   Years of education: Not on file   Highest education level: Not on file  Occupational History   Not on file  Tobacco Use   Smoking status: Never   Smokeless tobacco: Never  Substance and Sexual Activity   Alcohol use: Not on file   Drug use: Not on file   Sexual activity: Not on file  Other Topics Concern   Not on file  Social History Narrative   Not on file   Social Determinants of Health   Financial Resource Strain: Low Risk  (06/20/2021)   Received from [facility], [facility]   Overall Financial Resource Strain (CARDIA)    Difficulty of Paying Living Expenses: Not hard at all  Food Insecurity: No Food Insecurity (06/20/2021)   Received from [facility], [facility]   Hunger Vital Sign    Worried About Running Out of Food in the Last Year: Never true    Ran Out of Food in the Last Year: Never true  Transportation Needs: No Transportation Needs (06/20/2021)   Received from [facility], [facility]   [facility] - Transportation    Lack of Transportation (Medical): No    Lack of Transportation (Non-Medical): No  Physical Activity: Not on file  Stress: Not on file  Social Connections: Not on file  Intimate Partner Violence: Not on file    No family history on file.  No Known Allergies  Outpatient Medications Prior to Visit  Medication Sig   atorvastatin (LIPITOR) 10 MG tablet TAKE 1 TABLET BY MOUTH DAILY   diazepam (VALIUM) 2 MG  tablet TAKE 1 TABLET BY MOUTH DAILY   diclofenac Sodium (VOLTAREN) 1 % GEL Apply 8 g topically 2 (two) times daily. 4 g to each knee twice a day   NIFEdipine (PROCARDIA-XL/NIFEDICAL-XL) 30 MG 24 hr tablet TAKE 1 TABLET BY MOUTH IN THE  MORNING   pantoprazole (PROTONIX) 40 MG tablet Take by mouth. (Patient not taking: Reported on 06/01/2022)   tamsulosin (FLOMAX) 0.4 MG CAPS capsule Take by mouth. (Patient not taking: Reported on 06/01/2022)   No facility-administered medications prior to visit.    Review of Systems  All other systems reviewed and are negative.      Objective:   BP 128/78   Pulse 69   Ht 5\' 8"  (1.727 m)   Wt 147 lb 6.4 oz (66.9 kg)   SpO2 96%   BMI 22.41 kg/m   Vitals:   02/09/23 1043  BP: 128/78  Pulse: 69  Height: 5\' 8"  (1.727 m)  Weight: 147 lb 6.4 oz (66.9 kg)  SpO2: 96%  BMI (Calculated): 22.42    Physical Exam Vitals reviewed.  Constitutional:      Appearance: Normal appearance.  HENT:     Head: Normocephalic.     Left Ear: There is no impacted  cerumen.     Nose: Nose normal.     Mouth/Throat:     Mouth: Mucous membranes are moist.     Pharynx: No posterior oropharyngeal erythema.  Eyes:     Extraocular Movements: Extraocular movements intact.     Pupils: Pupils are equal, round, and reactive to light.  Cardiovascular:     Rate and Rhythm: Regular rhythm.     Chest Wall: PMI is not displaced.     Pulses: Normal pulses.     Heart sounds: Normal heart sounds. No murmur heard. Pulmonary:     Effort: Pulmonary effort is normal.     Breath sounds: Normal air entry. No rhonchi or rales.  Abdominal:     General: Abdomen is flat. Bowel sounds are normal. There is no distension.     Palpations: Abdomen is soft. There is no hepatomegaly, splenomegaly or mass.     Tenderness: There is no abdominal tenderness.  Musculoskeletal:        General: Normal range of motion.     Cervical back: Normal range of motion and neck supple.     Right lower leg:  No edema.     Left lower leg: No edema.  Skin:    General: Skin is warm and dry.     Findings: Rash present. Rash is papular.     Comments: Purpura on right arm  Neurological:     General: No focal deficit present.     Mental Status: He is alert and oriented to person, place, and time.     Cranial Nerves: No cranial nerve deficit.     Motor: No weakness.  Psychiatric:        Mood and Affect: Mood normal.        Behavior: Behavior normal.      Results for orders placed or performed in visit on 02/09/23  POCT Urinalysis Dipstick (81002)  Result Value Ref Range   Color, UA yellow    Clarity, UA clear    Glucose, UA Negative Negative   Bilirubin, UA negative    Ketones, UA negative    Spec Grav, UA 1.015 1.010 - 1.025   Blood, UA negative    pH, UA 6.0 5.0 - 8.0   Protein, UA Negative Negative   Urobilinogen, UA 0.2 0.2 or 1.0 E.U./dL   Nitrite, UA negative    Leukocytes, UA Negative Negative   Appearance clear    Odor none         Assessment & Plan:  As per problem list.  Problem List Items Addressed This Visit   None Visit Diagnoses     Acute cystitis without hematuria    -  Primary   Relevant Orders   POCT Urinalysis Dipstick (16606) (Completed)   MCI (mild cognitive impairment)           Return if symptoms worsen or fail to improve.   Total time spent: 20 minutes  Luna Fuse, MD  02/09/2023   This document may have been prepared by [facility] Voice Recognition software and as such may include unintentional dictation errors.

## 2023-02-14 ENCOUNTER — Encounter: Payer: Self-pay | Admitting: Internal Medicine

## 2023-02-14 ENCOUNTER — Ambulatory Visit (INDEPENDENT_AMBULATORY_CARE_PROVIDER_SITE_OTHER): Payer: Medicare Other | Admitting: Internal Medicine

## 2023-02-14 VITALS — BP 118/68 | HR 79 | Ht 68.0 in | Wt 147.6 lb

## 2023-02-14 DIAGNOSIS — Z0130 Encounter for examination of blood pressure without abnormal findings: Secondary | ICD-10-CM

## 2023-02-14 DIAGNOSIS — F03A0 Unspecified dementia, mild, without behavioral disturbance, psychotic disturbance, mood disturbance, and anxiety: Secondary | ICD-10-CM | POA: Diagnosis not present

## 2023-02-14 DIAGNOSIS — G3184 Mild cognitive impairment, so stated: Secondary | ICD-10-CM

## 2023-02-14 NOTE — Progress Notes (Signed)
Established Patient Office Visit  Subjective:  Patient ID: Derek Yates, male    DOB: 04/15/1940  Age: 82 y.o. MRN: [id number]  Chief Complaint  Patient presents with   Memory Loss    Memory evaluation    No new complaints here for memory eval. Refer to scanned documents.    No other concerns at this time.   No past medical history on file.  No past surgical history on file.  Social History   Socioeconomic History   Marital status: Married    Spouse name: Not on file   Number of children: Not on file   Years of education: Not on file   Highest education level: Not on file  Occupational History   Not on file  Tobacco Use   Smoking status: Never   Smokeless tobacco: Never  Substance and Sexual Activity   Alcohol use: Not on file   Drug use: Not on file   Sexual activity: Not on file  Other Topics Concern   Not on file  Social History Narrative   Not on file   Social Determinants of Health   Financial Resource Strain: Low Risk  (06/20/2021)   Received from [facility], [facility]   Overall Financial Resource Strain (CARDIA)    Difficulty of Paying Living Expenses: Not hard at all  Food Insecurity: No Food Insecurity (06/20/2021)   Received from [facility], [facility]   Hunger Vital Sign    Worried About Running Out of Food in the Last Year: Never true    Ran Out of Food in the Last Year: Never true  Transportation Needs: No Transportation Needs (06/20/2021)   Received from [facility], [facility]   [facility] - Transportation    Lack of Transportation (Medical): No    Lack of Transportation (Non-Medical): No  Physical Activity: Not on file  Stress: Not on file  Social Connections: Not on file  Intimate Partner Violence: Not on file    No family history on file.  No Known Allergies  Outpatient Medications Prior to Visit  Medication Sig   atorvastatin (LIPITOR) 10 MG tablet TAKE 1 TABLET BY MOUTH DAILY   diazepam (VALIUM) 2  MG tablet TAKE 1 TABLET BY MOUTH DAILY   diclofenac Sodium (VOLTAREN) 1 % GEL Apply 8 g topically 2 (two) times daily. 4 g to each knee twice a day   NIFEdipine (PROCARDIA-XL/NIFEDICAL-XL) 30 MG 24 hr tablet TAKE 1 TABLET BY MOUTH IN THE  MORNING   pantoprazole (PROTONIX) 40 MG tablet Take by mouth. (Patient not taking: Reported on 06/01/2022)   tamsulosin (FLOMAX) 0.4 MG CAPS capsule Take by mouth. (Patient not taking: Reported on 06/01/2022)   No facility-administered medications prior to visit.    Review of Systems  All other systems reviewed and are negative.      Objective:   BP 118/68   Pulse 79   Ht 5\' 8"  (1.727 m)   Wt 147 lb 9.6 oz (67 kg)   SpO2 96%   BMI 22.44 kg/m   Vitals:   02/14/23 1314  BP: 118/68  Pulse: 79  Height: 5\' 8"  (1.727 m)  Weight: 147 lb 9.6 oz (67 kg)  SpO2: 96%  BMI (Calculated): 22.45    Physical Exam Vitals reviewed.  Constitutional:      Appearance: Normal appearance.  HENT:     Head: Normocephalic.     Left Ear: There is no impacted cerumen.  Nose: Nose normal.     Mouth/Throat:     Mouth: Mucous membranes are moist.     Pharynx: No posterior oropharyngeal erythema.  Eyes:     Extraocular Movements: Extraocular movements intact.     Pupils: Pupils are equal, round, and reactive to light.  Cardiovascular:     Rate and Rhythm: Regular rhythm.     Chest Wall: PMI is not displaced.     Pulses: Normal pulses.     Heart sounds: Normal heart sounds. No murmur heard. Pulmonary:     Effort: Pulmonary effort is normal.     Breath sounds: Normal air entry. No rhonchi or rales.  Abdominal:     General: Abdomen is flat. Bowel sounds are normal. There is no distension.     Palpations: Abdomen is soft. There is no hepatomegaly, splenomegaly or mass.     Tenderness: There is no abdominal tenderness.  Musculoskeletal:        General: Normal range of motion.     Cervical back: Normal range of motion and neck supple.     Right lower leg:  No edema.     Left lower leg: No edema.  Skin:    General: Skin is warm and dry.     Findings: Rash present. Rash is papular.     Comments: Purpura on right arm  Neurological:     General: No focal deficit present.     Mental Status: He is alert and oriented to person, place, and time.     Cranial Nerves: No cranial nerve deficit.     Motor: No weakness.  Psychiatric:        Mood and Affect: Mood normal.        Behavior: Behavior normal.      No results found for any visits on 02/14/23.  Recent Results (from the past 2160 hour(Nieves Barberi))  CBC With Diff/Platelet     Status: None   Collection Time: 02/03/23  8:52 AM  Result Value Ref Range   WBC 7.1 3.4 - 10.8 x10E3/uL   RBC 4.59 4.14 - 5.80 x10E6/uL   Hemoglobin 13.3 13.0 - 17.7 g/dL   Hematocrit 16.1 09.6 - 51.0 %   MCV 87 79 - 97 fL   MCH 29.0 26.6 - 33.0 pg   MCHC 33.3 31.5 - 35.7 g/dL   RDW 04.5 40.9 - 81.1 %   Platelets 271 150 - 450 x10E3/uL   Neutrophils 48 Not Estab. %   Lymphs 37 Not Estab. %   Monocytes 10 Not Estab. %   Eos 4 Not Estab. %   Basos 1 Not Estab. %   Neutrophils Absolute 3.5 1.4 - 7.0 x10E3/uL   Lymphocytes Absolute 2.6 0.7 - 3.1 x10E3/uL   Monocytes Absolute 0.7 0.1 - 0.9 x10E3/uL   EOS (ABSOLUTE) 0.3 0.0 - 0.4 x10E3/uL   Basophils Absolute 0.1 0.0 - 0.2 x10E3/uL   Immature Granulocytes 0 Not Estab. %   Immature Grans (Abs) 0.0 0.0 - 0.1 x10E3/uL  Lipid panel     Status: None   Collection Time: 02/03/23  8:52 AM  Result Value Ref Range   Cholesterol, Total 143 100 - 199 mg/dL   Triglycerides 914 0 - 149 mg/dL   HDL 57 >78 mg/dL   VLDL Cholesterol Cal 20 5 - 40 mg/dL   LDL Chol Calc (NIH) 66 0 - 99 mg/dL   Chol/HDL Ratio 2.5 0.0 - 5.0 ratio    Comment:  T. Chol/HDL Ratio                                             Men  Women                               1/2 Avg.Risk  3.4    3.3                                   Avg.Risk  5.0    4.4                                 2X Avg.Risk  9.6    7.1                                3X Avg.Risk 23.4   11.0   Comprehensive metabolic panel     Status: None   Collection Time: 02/03/23  8:52 AM  Result Value Ref Range   Glucose 89 70 - 99 mg/dL   BUN 15 8 - 27 mg/dL   Creatinine, Ser 5.40 0.76 - 1.27 mg/dL   eGFR 76 >98 JX/BJY/7.82   BUN/Creatinine Ratio 15 10 - 24   Sodium 143 134 - 144 mmol/L   Potassium 4.1 3.5 - 5.2 mmol/L   Chloride 102 96 - 106 mmol/L   CO2 25 20 - 29 mmol/L   Calcium 9.8 8.6 - 10.2 mg/dL   Total Protein 7.4 6.0 - 8.5 g/dL   Albumin 4.5 3.7 - 4.7 g/dL   Globulin, Total 2.9 1.5 - 4.5 g/dL   Bilirubin Total 0.5 0.0 - 1.2 mg/dL   Alkaline Phosphatase 96 44 - 121 IU/L   AST 18 0 - 40 IU/L   ALT 14 0 - 44 IU/L  POCT Urinalysis Dipstick (95621)     Status: Normal   Collection Time: 02/09/23 10:55 AM  Result Value Ref Range   Color, UA yellow    Clarity, UA clear    Glucose, UA Negative Negative   Bilirubin, UA negative    Ketones, UA negative    Spec Grav, UA 1.015 1.010 - 1.025   Blood, UA negative    pH, UA 6.0 5.0 - 8.0   Protein, UA Negative Negative   Urobilinogen, UA 0.2 0.2 or 1.0 E.U./dL   Nitrite, UA negative    Leukocytes, UA Negative Negative   Appearance clear    Odor none       Assessment & Plan:  As per problem list. Call with CT results.  Problem List Items Addressed This Visit       Nervous and Auditory   Mild dementia without behavioral disturbance, psychotic disturbance, mood disturbance, or anxiety (HCC) - Primary    Return if symptoms worsen or fail to improve.   Total time spent: 20 minutes  Luna Fuse, MD  02/14/2023   This document may have been prepared by [facility] Voice Recognition software and as such may include unintentional dictation errors.

## 2023-02-15 ENCOUNTER — Ambulatory Visit: Payer: Medicare Other

## 2023-02-15 DIAGNOSIS — G3184 Mild cognitive impairment, so stated: Secondary | ICD-10-CM

## 2023-02-15 DIAGNOSIS — F0390 Unspecified dementia without behavioral disturbance: Secondary | ICD-10-CM

## 2023-02-21 ENCOUNTER — Telehealth: Payer: Self-pay | Admitting: Internal Medicine

## 2023-02-21 ENCOUNTER — Other Ambulatory Visit: Payer: Self-pay | Admitting: Internal Medicine

## 2023-02-21 DIAGNOSIS — F03A0 Unspecified dementia, mild, without behavioral disturbance, psychotic disturbance, mood disturbance, and anxiety: Secondary | ICD-10-CM

## 2023-02-21 MED ORDER — DONEPEZIL HCL 5 MG PO TABS: 5.0000 mg | ORAL_TABLET | Freq: Every day | ORAL | 0 refills | Status: DC

## 2023-02-21 NOTE — Telephone Encounter (Signed)
Patient called in stating he seen his CT scan report on MyChart and does not understand what it means. [place] is leaving [place] go [place] and needs this interpreted today. Please advise.

## 2023-02-22 NOTE — Progress Notes (Signed)
Patient notified

## 2023-04-09 ENCOUNTER — Other Ambulatory Visit: Payer: Self-pay | Admitting: Internal Medicine

## 2023-04-09 DIAGNOSIS — E782 Mixed hyperlipidemia: Secondary | ICD-10-CM

## 2023-04-21 ENCOUNTER — Telehealth: Payer: Self-pay | Admitting: Internal Medicine

## 2023-04-21 ENCOUNTER — Ambulatory Visit
Admission: RE | Admit: 2023-04-21 | Discharge: 2023-04-21 | Disposition: A | Discharge status: Home / Self Care | Payer: Medicare Other | Source: Ambulatory Visit | Attending: Cardiology | Admitting: Cardiology

## 2023-04-21 ENCOUNTER — Ambulatory Visit: Payer: Medicare Other | Admitting: Cardiology

## 2023-04-21 ENCOUNTER — Ambulatory Visit
Admission: RE | Admit: 2023-04-21 | Discharge: 2023-04-21 | Disposition: A | Discharge status: Home / Self Care | Payer: Medicare Other | Attending: Cardiology | Admitting: Cardiology

## 2023-04-21 ENCOUNTER — Encounter: Payer: Self-pay | Admitting: Cardiology

## 2023-04-21 VITALS — BP 138/62 | HR 81 | Ht 68.0 in | Wt 135.0 lb

## 2023-04-21 DIAGNOSIS — R918 Other nonspecific abnormal finding of lung field: Secondary | ICD-10-CM | POA: Diagnosis not present

## 2023-04-21 DIAGNOSIS — N390 Urinary tract infection, site not specified: Secondary | ICD-10-CM

## 2023-04-21 DIAGNOSIS — Z0130 Encounter for examination of blood pressure without abnormal findings: Secondary | ICD-10-CM

## 2023-04-21 DIAGNOSIS — R82998 Other abnormal findings in urine: Secondary | ICD-10-CM

## 2023-04-21 DIAGNOSIS — R051 Acute cough: Secondary | ICD-10-CM | POA: Diagnosis not present

## 2023-04-21 DIAGNOSIS — R059 Cough, unspecified: Secondary | ICD-10-CM | POA: Diagnosis not present

## 2023-04-21 LAB — POCT URINALYSIS DIPSTICK
Bilirubin, UA: NEGATIVE
Glucose, UA: NEGATIVE
Ketones, UA: NEGATIVE
Leukocytes, UA: NEGATIVE
Nitrite, UA: NEGATIVE
Protein, UA: POSITIVE — AB
Spec Grav, UA: 1.025 (ref 1.010–1.025)
Urobilinogen, UA: 0.2 U/dL
pH, UA: 6 (ref 5.0–8.0)

## 2023-04-21 NOTE — Telephone Encounter (Signed)
Entered in error

## 2023-04-21 NOTE — Progress Notes (Signed)
Established Patient Office Visit  Subjective:  Patient ID: Derek Yates, male    DOB: 1941/03/01  Age: 83 y.o. MRN: [id number]  Chief Complaint  Patient presents with   Acute Visit   Urinary Tract Infection    UTI Symptoms     Patient in office for an acute visit, complaining of a UTI. [place] Patient was [place] with a UTI while out of the country. Patient was started on Cipro 500 mg bid for Patient has taken 5 doses, has 5 more to take. Urine today abnormal, will send for culture. Son in the room, states patient has seemed disoriented, possibly due to UTI. Increase fluid intake.  Patient also complaining of a cough, will send for a chest xray.   Urinary Tract Infection  This is a new problem. The current episode started in the past 7 days. The problem occurs every urination. The problem has been unchanged. Associated symptoms include frequency, hematuria and urgency.    No other concerns at this time.   History reviewed. No pertinent past medical history.  History reviewed. No pertinent surgical history.  Social History   Socioeconomic History   Marital status: Married    Spouse name: Not on file   Number of children: Not on file   Years of education: Not on file   Highest education level: Not on file  Occupational History   Not on file  Tobacco Use   Smoking status: Never   Smokeless tobacco: Never  Substance and Sexual Activity   Alcohol use: Not on file   Drug use: Not on file   Sexual activity: Not on file  Other Topics Concern   Not on file  Social History Narrative   Not on file   Social Drivers of Health   Financial Resource Strain: Low Risk  (06/20/2021)   Received from [facility], [facility]   Overall Financial Resource Strain (CARDIA)    Difficulty of Paying Living Expenses: Not hard at all  Food Insecurity: No Food Insecurity (06/20/2021)   Received from [facility], [facility]   Hunger Vital Sign    Worried About Running Out of Food in the Last Year: Never true    Ran Out of Food in the Last Year: Never true  Transportation Needs: No Transportation Needs (06/20/2021)   Received from [facility], [facility]   [facility] - Transportation    Lack of Transportation (Medical): No    Lack of Transportation (Non-Medical): No  Physical Activity: Not on file  Stress: Not on file  Social Connections: Not on file  Intimate Partner Violence: Not on file    History reviewed. No pertinent family history.  No Known Allergies  Outpatient Medications Prior to Visit  Medication Sig   ciprofloxacin (CIPRO) 500 MG tablet Take 500 mg by mouth 2 (two) times daily.   fluticasone furoate-vilanterol (BREO ELLIPTA) 100-25 MCG/ACT AEPB Inhale 1 puff into the lungs daily.   atorvastatin (LIPITOR) 10 MG tablet TAKE 1 TABLET BY MOUTH DAILY   diazepam (VALIUM) 2 MG tablet TAKE 1 TABLET BY MOUTH DAILY   diclofenac Sodium (VOLTAREN) 1 % GEL Apply 8 g topically 2 (two) times daily. 4 g to each knee twice a day   donepezil (ARICEPT) 5 MG tablet Take 1 tablet (5 mg total) by mouth at bedtime.   NIFEdipine (PROCARDIA-XL/NIFEDICAL-XL) 30 MG 24 hr tablet TAKE 1 TABLET BY MOUTH  IN THE  MORNING   pantoprazole (PROTONIX) 40 MG tablet Take by mouth. (Patient not taking: Reported on 06/01/2022)   tamsulosin (FLOMAX) 0.4 MG CAPS capsule Take by mouth. (Patient not taking: Reported on 06/01/2022)   No facility-administered medications prior to visit.    Review of Systems  Constitutional: Negative.   HENT: Negative.    Eyes: Negative.   Respiratory:  Positive for cough and sputum production. Negative for shortness of breath.   Cardiovascular: Negative.  Negative for chest pain.  Gastrointestinal: Negative.  Negative for abdominal pain, constipation and diarrhea.  Genitourinary:  Positive for frequency, hematuria and urgency. Negative for dysuria.  Musculoskeletal:  Negative for joint  pain and myalgias.  Skin: Negative.   Neurological: Negative.  Negative for dizziness and headaches.  Endo/Heme/Allergies: Negative.   All other systems reviewed and are negative.      Objective:   BP 138/62   Pulse 81   Ht 5\' 8"  (1.727 m)   Wt 135 lb (61.2 kg)   SpO2 95%   BMI 20.53 kg/m   Vitals:   04/21/23 1035  BP: 138/62  Pulse: 81  Height: 5\' 8"  (1.727 m)  Weight: 135 lb (61.2 kg)  SpO2: 95%  BMI (Calculated): 20.53    Physical Exam Nursing note reviewed.  Constitutional:      Appearance: Normal appearance. He is normal weight.  HENT:     Head: Normocephalic and atraumatic.     Nose: Nose normal.     Mouth/Throat:     Mouth: Mucous membranes are moist.     Pharynx: Oropharynx is clear.  Eyes:     Extraocular Movements: Extraocular movements intact.     Conjunctiva/sclera: Conjunctivae normal.     Pupils: Pupils are equal, round, and reactive to light.  Cardiovascular:     Rate and Rhythm: Normal rate and regular rhythm.     Pulses: Normal pulses.     Heart sounds: Normal heart sounds.  Pulmonary:     Effort: Pulmonary effort is normal.     Breath sounds: Wheezing present.  Abdominal:     General: Abdomen is flat. Bowel sounds are normal.     Palpations: Abdomen is soft.  Musculoskeletal:        General: Normal range of motion.     Cervical back: Normal range of motion.  Skin:    General: Skin is warm and dry.  Neurological:     General: No focal deficit present.     Mental Status: He is alert and oriented to person, place, and time.  Psychiatric:        Mood and Affect: Mood normal.        Behavior: Behavior normal.        Thought Content: Thought content normal.        Judgment: Judgment normal.      Results for orders placed or performed in visit on 04/21/23  POCT Urinalysis Dipstick (81002)  Result Value Ref Range   Color, UA     Clarity, UA     Glucose, UA Negative Negative   Bilirubin, UA Negative    Ketones, UA Negative    Spec  Grav, UA 1.025 1.010 - 1.025   Blood, UA 3+    pH, UA 6.0 5.0 - 8.0   Protein, UA Positive (A) Negative   Urobilinogen, UA 0.2 0.2 or 1.0 E.U./dL   Nitrite, UA Negative    Leukocytes, UA Negative Negative   Appearance     Odor  Recent Results (from the past 2160 hours)  CBC With Diff/Platelet     Status: None   Collection Time: 02/03/23  8:52 AM  Result Value Ref Range   WBC 7.1 3.4 - 10.8 x10E3/uL   RBC 4.59 4.14 - 5.80 x10E6/uL   Hemoglobin 13.3 13.0 - 17.7 g/dL   Hematocrit 47.8 29.5 - 51.0 %   MCV 87 79 - 97 fL   MCH 29.0 26.6 - 33.0 pg   MCHC 33.3 31.5 - 35.7 g/dL   RDW 62.1 30.8 - 65.7 %   Platelets 271 150 - 450 x10E3/uL   Neutrophils 48 Not Estab. %   Lymphs 37 Not Estab. %   Monocytes 10 Not Estab. %   Eos 4 Not Estab. %   Basos 1 Not Estab. %   Neutrophils Absolute 3.5 1.4 - 7.0 x10E3/uL   Lymphocytes Absolute 2.6 0.7 - 3.1 x10E3/uL   Monocytes Absolute 0.7 0.1 - 0.9 x10E3/uL   EOS (ABSOLUTE) 0.3 0.0 - 0.4 x10E3/uL   Basophils Absolute 0.1 0.0 - 0.2 x10E3/uL   Immature Granulocytes 0 Not Estab. %   Immature Grans (Abs) 0.0 0.0 - 0.1 x10E3/uL  Lipid panel     Status: None   Collection Time: 02/03/23  8:52 AM  Result Value Ref Range   Cholesterol, Total 143 100 - 199 mg/dL   Triglycerides 846 0 - 149 mg/dL   HDL 57 >96 mg/dL   VLDL Cholesterol Cal 20 5 - 40 mg/dL   LDL Chol Calc (NIH) 66 0 - 99 mg/dL   Chol/HDL Ratio 2.5 0.0 - 5.0 ratio    Comment:                                   T. Chol/HDL Ratio                                             Men  Women                               1/2 Avg.Risk  3.4    3.3                                   Avg.Risk  5.0    4.4                                2X Avg.Risk  9.6    7.1                                3X Avg.Risk 23.4   11.0   Comprehensive metabolic panel     Status: None   Collection Time: 02/03/23  8:52 AM  Result Value Ref Range   Glucose 89 70 - 99 mg/dL   BUN 15 8 - 27 mg/dL   Creatinine, Ser 2.95  0.76 - 1.27 mg/dL   eGFR 76 >28 UX/LKG/4.01   BUN/Creatinine Ratio 15 10 - 24   Sodium 143 134 - 144 mmol/L   Potassium 4.1 3.5 - 5.2 mmol/L  Chloride 102 96 - 106 mmol/L   CO2 25 20 - 29 mmol/L   Calcium 9.8 8.6 - 10.2 mg/dL   Total Protein 7.4 6.0 - 8.5 g/dL   Albumin 4.5 3.7 - 4.7 g/dL   Globulin, Total 2.9 1.5 - 4.5 g/dL   Bilirubin Total 0.5 0.0 - 1.2 mg/dL   Alkaline Phosphatase 96 44 - 121 IU/L   AST 18 0 - 40 IU/L   ALT 14 0 - 44 IU/L  POCT Urinalysis Dipstick (04540)     Status: Normal   Collection Time: 02/09/23 10:55 AM  Result Value Ref Range   Color, UA yellow    Clarity, UA clear    Glucose, UA Negative Negative   Bilirubin, UA negative    Ketones, UA negative    Spec Grav, UA 1.015 1.010 - 1.025   Blood, UA negative    pH, UA 6.0 5.0 - 8.0   Protein, UA Negative Negative   Urobilinogen, UA 0.2 0.2 or 1.0 E.U./dL   Nitrite, UA negative    Leukocytes, UA Negative Negative   Appearance clear    Odor none   POCT Urinalysis Dipstick ([id number])     Status: Abnormal   Collection Time: 04/21/23 10:28 AM  Result Value Ref Range   Color, UA     Clarity, UA     Glucose, UA Negative Negative   Bilirubin, UA Negative    Ketones, UA Negative    Spec Grav, UA 1.025 1.010 - 1.025   Blood, UA 3+    pH, UA 6.0 5.0 - 8.0   Protein, UA Positive (A) Negative   Urobilinogen, UA 0.2 0.2 or 1.0 E.U./dL   Nitrite, UA Negative    Leukocytes, UA Negative Negative   Appearance     Odor        Assessment & Plan:  Continue Cipro.  Send urine for culture. Increase water intake. Chest xray today.   Problem List Items Addressed This Visit       Other   Acute cough   Relevant Orders   DG Chest 2 View   Other abnormal findings in urine - Primary   Relevant Orders   POCT Urinalysis Dipstick (14782) (Completed)   Urine Culture    Return in about 1 week (around 04/28/2023) for with TJ.   Total time spent: 25 minutes  [employer], NP  04/21/2023   This document  may have been prepared by Dragon Voice Recognition software and as such may include unintentional dictation errors.

## 2023-04-29 ENCOUNTER — Ambulatory Visit (INDEPENDENT_AMBULATORY_CARE_PROVIDER_SITE_OTHER): Payer: Medicare Other | Admitting: Internal Medicine

## 2023-04-29 VITALS — BP 120/62 | HR 69 | Temp 97.7°F | Ht 68.0 in | Wt 137.0 lb

## 2023-04-29 DIAGNOSIS — N410 Acute prostatitis: Secondary | ICD-10-CM | POA: Diagnosis not present

## 2023-04-29 DIAGNOSIS — R310 Gross hematuria: Secondary | ICD-10-CM

## 2023-04-29 DIAGNOSIS — R109 Unspecified abdominal pain: Secondary | ICD-10-CM | POA: Diagnosis not present

## 2023-04-29 DIAGNOSIS — Z0130 Encounter for examination of blood pressure without abnormal findings: Secondary | ICD-10-CM

## 2023-04-29 NOTE — Progress Notes (Signed)
 Established Patient Office Visit  Subjective:  Patient ID: Derek Yates, male    DOB: 04-04-1940  Age: 83 y.o. MRN: [id number]  Chief Complaint  Patient presents with   Follow-up    1 Week Follow Up    F/u suspected UTI, however also had several episodes of urinary bleeding while in [place] but denied any suprapubic, perineal or flank pain. Of note has history of prostatitis. Eventually received 10 day course of cipro.    No other concerns at this time.   No past medical history on file.  No past surgical history on file.  Social History   Socioeconomic History   Marital status: Married    Spouse name: Not on file   Number of children: Not on file   Years of education: Not on file   Highest education level: Not on file  Occupational History   Not on file  Tobacco Use   Smoking status: Never   Smokeless tobacco: Never  Substance and Sexual Activity   Alcohol use: Not on file   Drug use: Not on file   Sexual activity: Not on file  Other Topics Concern   Not on file  Social History Narrative   Not on file   Social Drivers of Health   Financial Resource Strain: Low Risk  (06/20/2021)   Received [facility], [facility]   Overall Financial Resource Strain (CARDIA)    Difficulty of Paying Living Expenses: Not hard at all  Food Insecurity: No Food Insecurity (06/20/2021)   Received from [facility], [facility]   Hunger Vital Sign    Worried About Running Out of Food in the Last Year: Never true    Ran Out of Food in the Last Year: Never true  Transportation Needs: No Transportation Needs (06/20/2021)   Received from [facility], [facility]   [facility] - Transportation    Lack of Transportation (Medical): No    Lack of Transportation (Non-Medical): No  Physical Activity: Not on file  Stress: Not on file  Social Connections: Not on file  Intimate Partner Violence: Not on file    No family history on file.  No Known  Allergies  Outpatient Medications Prior to Visit  Medication Sig   atorvastatin  (LIPITOR) 10 MG tablet TAKE 1 TABLET BY MOUTH DAILY   diazepam  (VALIUM ) 2 MG tablet TAKE 1 TABLET BY MOUTH DAILY   diclofenac  Sodium (VOLTAREN ) 1 % GEL Apply 8 g topically 2 (two) times daily. 4 g to each knee twice a day   donepezil  (ARICEPT ) 5 MG tablet Take 1 tablet (5 mg total) by mouth at bedtime.   fluticasone furoate-vilanterol (BREO ELLIPTA) 100-25 MCG/ACT AEPB Inhale 1 puff into the lungs daily.   NIFEdipine (PROCARDIA-XL/NIFEDICAL-XL) 30 MG 24 hr tablet TAKE 1 TABLET BY MOUTH IN THE  MORNING   pantoprazole  (PROTONIX ) 40 MG tablet Take by mouth. (Patient not taking: Reported on 06/01/2022)   tamsulosin (FLOMAX) 0.4 MG CAPS capsule Take by mouth. (Patient not taking: Reported on 06/01/2022)   No facility-administered medications prior to visit.    Review of Systems  All other systems reviewed and are negative.      Objective:   BP 120/62   Pulse 69   Temp 97.7 F (36.5 C) (Tympanic)   Ht 5' 8 (1.727 m)   Wt 137 lb (62.1 kg)   SpO2 97%   BMI 20.83 kg/m   Vitals:   04/29/23 1556  BP: 120/62  Pulse: 69  Temp: 97.7 F (36.5 C)  Height: 5' 8 (1.727 m)  Weight: 137 lb (62.1 kg)  SpO2: 97%  TempSrc: Tympanic  BMI (Calculated): 20.84    Physical Exam Vitals reviewed.  Constitutional:      Appearance: Normal appearance.  HENT:     Head: Normocephalic.     Left Ear: There is no impacted cerumen.     Nose: Nose normal.     Mouth/Throat:     Mouth: Mucous membranes are moist.     Pharynx: No posterior oropharyngeal erythema.  Eyes:     Extraocular Movements: Extraocular movements intact.     Pupils: Pupils are equal, round, and reactive to light.  Cardiovascular:     Rate and Rhythm: Regular rhythm.     Chest Wall: PMI is not displaced.     Pulses: Normal pulses.     Heart sounds: Normal heart sounds. No murmur heard. Pulmonary:     Effort: Pulmonary effort is normal.      Breath sounds: Normal air entry. No rhonchi or rales.  Abdominal:     General: Abdomen is flat. Bowel sounds are normal. There is no distension.     Palpations: Abdomen is soft. There is no hepatomegaly, splenomegaly or mass.     Tenderness: There is no abdominal tenderness.  Musculoskeletal:        General: Normal range of motion.     Cervical back: Normal range of motion and neck supple.     Right lower leg: No edema.     Left lower leg: No edema.  Skin:    General: Skin is warm and dry.     Findings: Rash present. Rash is papular.     Comments: Purpura on right arm  Neurological:     General: No focal deficit present.     Mental Status: He is alert and oriented to person, place, and time.     Cranial Nerves: No cranial nerve deficit.     Motor: No weakness.  Psychiatric:        Mood and Affect: Mood normal.        Behavior: Behavior normal.      No results found for any visits on 04/29/23.  Recent Results (from the past 2160 hours)  CBC With Diff/Platelet     Status: None   Collection Time: 02/03/23  8:52 AM  Result Value Ref Range   WBC 7.1 3.4 - 10.8 x10E3/uL   RBC 4.59 4.14 - 5.80 x10E6/uL   Hemoglobin 13.3 13.0 - 17.7 g/dL   Hematocrit 60.0 62.4 - 51.0 %   MCV 87 79 - 97 fL   MCH 29.0 26.6 - 33.0 pg   MCHC 33.3 31.5 - 35.7 g/dL   RDW 86.7 88.3 - 84.5 %   Platelets 271 150 - 450 x10E3/uL   Neutrophils 48 Not Estab. %   Lymphs 37 Not Estab. %   Monocytes 10 Not Estab. %   Eos 4 Not Estab. %   Basos 1 Not Estab. %   Neutrophils Absolute 3.5 1.4 - 7.0 x10E3/uL   Lymphocytes Absolute 2.6 0.7 - 3.1 x10E3/uL   Monocytes Absolute 0.7 0.1 - 0.9 x10E3/uL   EOS (ABSOLUTE) 0.3 0.0 - 0.4 x10E3/uL   Basophils Absolute 0.1 0.0 - 0.2 x10E3/uL   Immature Granulocytes 0 Not Estab. %   Immature Grans (Abs) 0.0 0.0 - 0.1 x10E3/uL  Lipid panel     Status: None   Collection Time:  02/03/23  8:52 AM  Result Value Ref Range   Cholesterol, Total 143 100 - 199 mg/dL    Triglycerides 889 0 - 149 mg/dL   HDL 57 >60 mg/dL   VLDL Cholesterol Cal 20 5 - 40 mg/dL   LDL Chol Calc (NIH) 66 0 - 99 mg/dL   Chol/HDL Ratio 2.5 0.0 - 5.0 ratio    Comment:                                   T. Chol/HDL Ratio                                             Men  Women                               1/2 Avg.Risk  3.4    3.3                                   Avg.Risk  5.0    4.4                                2X Avg.Risk  9.6    7.1                                3X Avg.Risk 23.4   11.0   Comprehensive metabolic panel     Status: None   Collection Time: 02/03/23  8:52 AM  Result Value Ref Range   Glucose 89 70 - 99 mg/dL   BUN 15 8 - 27 mg/dL   Creatinine, Ser 9.01 0.76 - 1.27 mg/dL   eGFR 76 >40 fO/fpw/8.26   BUN/Creatinine Ratio 15 10 - 24   Sodium 143 134 - 144 mmol/L   Potassium 4.1 3.5 - 5.2 mmol/L   Chloride 102 96 - 106 mmol/L   CO2 25 20 - 29 mmol/L   Calcium  9.8 8.6 - 10.2 mg/dL   Total Protein 7.4 6.0 - 8.5 g/dL   Albumin 4.5 3.7 - 4.7 g/dL   Globulin, Total 2.9 1.5 - 4.5 g/dL   Bilirubin Total 0.5 0.0 - 1.2 mg/dL   Alkaline Phosphatase 96 44 - 121 IU/L   AST 18 0 - 40 IU/L   ALT 14 0 - 44 IU/L  POCT Urinalysis Dipstick (18997)     Status: Normal   Collection Time: 02/09/23 10:55 AM  Result Value Ref Range   Color, UA yellow    Clarity, UA clear    Glucose, UA Negative Negative   Bilirubin, UA negative    Ketones, UA negative    Spec Grav, UA 1.015 1.010 - 1.025   Blood, UA negative    pH, UA 6.0 5.0 - 8.0   Protein, UA Negative Negative   Urobilinogen, UA 0.2 0.2 or 1.0 E.U./dL   Nitrite, UA negative    Leukocytes, UA Negative Negative   Appearance clear    Odor none   POCT Urinalysis Dipstick (18997)     Status: Abnormal   Collection Time: 04/21/23 10:28 AM  Result Value Ref Range   Color, UA     Clarity, UA     Glucose, UA Negative Negative   Bilirubin, UA Negative    Ketones, UA Negative    Spec Grav, UA 1.025 1.010 - 1.025   Blood,  UA 3+    pH, UA 6.0 5.0 - 8.0   Protein, UA Positive (A) Negative   Urobilinogen, UA 0.2 0.2 or 1.0 E.U./dL   Nitrite, UA Negative    Leukocytes, UA Negative Negative   Appearance     Odor        Assessment & Plan:  As per problem list  Problem List Items Addressed This Visit   None Visit Diagnoses       Gross hematuria    -  Primary     Acute prostatitis       Relevant Orders   PSA     Abdominal pain, unspecified abdominal location       Relevant Orders   CT ABDOMEN PELVIS WO CONTRAST       Return in about 1 week (around 05/06/2023) for Imaging results, lab results.   Total time spent: 30 minutes  Sherrill Cinderella Perry, MD  04/29/2023   This document may have been prepared by [facility] Voice Recognition software and as such may include unintentional dictation errors.

## 2023-05-02 ENCOUNTER — Telehealth: Payer: Self-pay

## 2023-05-02 NOTE — Telephone Encounter (Signed)
 Patient son called stating that since he started taking the dementia medications he has seen him show some of the side effects of the medication and would like to talk with you about continuing the medication or not please advise

## 2023-05-03 ENCOUNTER — Telehealth: Payer: Self-pay | Admitting: Internal Medicine

## 2023-05-03 ENCOUNTER — Ambulatory Visit (INDEPENDENT_AMBULATORY_CARE_PROVIDER_SITE_OTHER): Payer: Medicare Other

## 2023-05-03 DIAGNOSIS — R109 Unspecified abdominal pain: Secondary | ICD-10-CM | POA: Diagnosis not present

## 2023-05-03 NOTE — Telephone Encounter (Signed)
Entered in error

## 2023-05-04 ENCOUNTER — Encounter: Payer: Self-pay | Admitting: Internal Medicine

## 2023-05-04 ENCOUNTER — Ambulatory Visit (INDEPENDENT_AMBULATORY_CARE_PROVIDER_SITE_OTHER): Payer: Medicare Other | Admitting: Internal Medicine

## 2023-05-04 VITALS — BP 118/72 | HR 70 | Temp 97.8°F | Ht 68.0 in | Wt 135.0 lb

## 2023-05-04 DIAGNOSIS — K5909 Other constipation: Secondary | ICD-10-CM | POA: Diagnosis not present

## 2023-05-04 DIAGNOSIS — N411 Chronic prostatitis: Secondary | ICD-10-CM

## 2023-05-04 DIAGNOSIS — K648 Other hemorrhoids: Secondary | ICD-10-CM | POA: Diagnosis not present

## 2023-05-04 DIAGNOSIS — Z0130 Encounter for examination of blood pressure without abnormal findings: Secondary | ICD-10-CM

## 2023-05-04 DIAGNOSIS — N390 Urinary tract infection, site not specified: Secondary | ICD-10-CM | POA: Diagnosis not present

## 2023-05-04 LAB — POCT URINALYSIS DIPSTICK
Blood, UA: NEGATIVE
Glucose, UA: NEGATIVE
Ketones, UA: NEGATIVE
Leukocytes, UA: NEGATIVE
Nitrite, UA: NEGATIVE
Protein, UA: NEGATIVE
Spec Grav, UA: 1.02 (ref 1.010–1.025)
Urobilinogen, UA: 0.2 U/dL — AB
pH, UA: 6 (ref 5.0–8.0)

## 2023-05-04 MED ORDER — SULFAMETHOXAZOLE-TRIMETHOPRIM 800-160 MG PO TABS: 1.0000 | ORAL_TABLET | Freq: Two times a day (BID) | ORAL | 0 refills | Status: DC

## 2023-05-04 MED ORDER — POLYETHYLENE GLYCOL 3350 17 G PO PACK: 17.0000 g | PACK | Freq: Every day | ORAL | 1 refills | Status: DC

## 2023-05-04 MED ORDER — BISACODYL 10 MG RE SUPP: 10.0000 mg | RECTAL | 0 refills | Status: DC | PRN

## 2023-05-04 MED ORDER — PRAMOXINE HCL (PERIANAL) 1 % EX FOAM
1.0000 | Freq: Three times a day (TID) | CUTANEOUS | 0 refills | Status: DC | PRN
Start: 2023-05-04 — End: 2023-05-12

## 2023-05-04 NOTE — Progress Notes (Signed)
Established Patient Office Visit  Subjective:  Patient ID: Derek Yates, male    DOB: [birth year]  Age: 83 y.o. MRN: [id number]  No chief complaint on file.   Here for follow up of dysuria and CT results. Also c/o hemorrhoids while CT showed constipation with retained stool in the rectum. Wife reassured that infection is not a side effect of donepezil.    No other concerns at this time.   No past medical history on file.  No past surgical history on file.  Social History   Socioeconomic History   Marital status: Married    Spouse name: Not on file   Number of children: Not on file   Years of education: Not on file   Highest education level: Not on file  Occupational History   Not on file  Tobacco Use   Smoking status: Never   Smokeless tobacco: Never  Substance and Sexual Activity   Alcohol use: Not on file   Drug use: Not on file   Sexual activity: Not on file  Other Topics Concern   Not on file  Social History Narrative   Not on file   Social Drivers of Health   Financial Resource Strain: Low Risk  (06/20/2021)   Received from [facility], [facility]   Overall Financial Resource Strain (CARDIA)    Difficulty of Paying Living Expenses: Not hard at all  Food Insecurity: No Food Insecurity (06/20/2021)   Received from [facility], [facility]   Hunger Vital Sign    Worried About Running Out of Food in the Last Year: Never true    Ran Out of Food in the Last Year: Never true  Transportation Needs: No Transportation Needs (06/20/2021)   Received from [facility], [facility]   [facility] - Transportation    Lack of Transportation (Medical): No    Lack of Transportation (Non-Medical): No  Physical Activity: Not on file  Stress: Not on file  Social Connections: Not on file  Intimate Partner Violence: Not on file    No family history on file.  No Known Allergies  Outpatient Medications Prior to Visit  Medication Sig   atorvastatin  (LIPITOR) 10 MG tablet TAKE 1 TABLET BY MOUTH DAILY   diazepam (VALIUM) 2 MG tablet TAKE 1 TABLET BY MOUTH DAILY   diclofenac Sodium (VOLTAREN) 1 % GEL Apply 8 g topically 2 (two) times daily. 4 g to each knee twice a day   donepezil (ARICEPT) 5 MG tablet Take 1 tablet (5 mg total) by mouth at bedtime.   fluticasone furoate-vilanterol (BREO ELLIPTA) 100-25 MCG/ACT AEPB Inhale 1 puff into the lungs daily.   NIFEdipine (PROCARDIA-XL/NIFEDICAL-XL) 30 MG 24 hr tablet TAKE 1 TABLET BY MOUTH IN THE  MORNING   pantoprazole (PROTONIX) 40 MG tablet Take by mouth. (Patient not taking: Reported on 06/01/2022)   tamsulosin (FLOMAX) 0.4 MG CAPS capsule Take by mouth. (Patient not taking: Reported on 06/01/2022)   No facility-administered medications prior to visit.    Review of Systems  All other systems reviewed and are negative.      Objective:   BP 118/72   Pulse 70   Temp 97.8 F (36.6 C)   Ht 5\' 8"  (1.727 m)   Wt 135 lb (61.2 kg)   SpO2 99%   BMI 20.53 kg/m   Vitals:   05/04/23 0954  BP: 118/72  Pulse: 70  Temp: 97.8 F (36.6 C)  Height: 5\' 8"  (  1.727 m)  Weight: 135 lb (61.2 kg)  SpO2: 99%  BMI (Calculated): 20.53    Physical Exam   Results for orders placed or performed in visit on 05/04/23  POCT Urinalysis Dipstick (81002)  Result Value Ref Range   Color, UA Yellow    Clarity, UA clear    Glucose, UA Negative Negative   Bilirubin, UA 1+    Ketones, UA negative    Spec Grav, UA 1.020 1.010 - 1.025   Blood, UA negative    pH, UA 6.0 5.0 - 8.0   Protein, UA Negative Negative   Urobilinogen, UA 0.2 (A) 0.2 or 1.0 E.U./dL   Nitrite, UA negative    Leukocytes, UA Negative Negative   Appearance clear    Odor no     Recent Results (from the past 2160 hours)  POCT Urinalysis Dipstick ([id number])     Status: Normal   Collection Time: 02/09/23 10:55 AM  Result Value Ref Range   Color, UA yellow    Clarity, UA clear    Glucose, UA Negative Negative   Bilirubin, UA  negative    Ketones, UA negative    Spec Grav, UA 1.015 1.010 - 1.025   Blood, UA negative    pH, UA 6.0 5.0 - 8.0   Protein, UA Negative Negative   Urobilinogen, UA 0.2 0.2 or 1.0 E.U./dL   Nitrite, UA negative    Leukocytes, UA Negative Negative   Appearance clear    Odor none   POCT Urinalysis Dipstick ([id number])     Status: Abnormal   Collection Time: 04/21/23 10:28 AM  Result Value Ref Range   Color, UA     Clarity, UA     Glucose, UA Negative Negative   Bilirubin, UA Negative    Ketones, UA Negative    Spec Grav, UA 1.025 1.010 - 1.025   Blood, UA 3+    pH, UA 6.0 5.0 - 8.0   Protein, UA Positive (A) Negative   Urobilinogen, UA 0.2 0.2 or 1.0 E.U./dL   Nitrite, UA Negative    Leukocytes, UA Negative Negative   Appearance     Odor    POCT Urinalysis Dipstick ([id number])     Status: Abnormal   Collection Time: 05/04/23 10:15 AM  Result Value Ref Range   Color, UA Yellow    Clarity, UA clear    Glucose, UA Negative Negative   Bilirubin, UA 1+    Ketones, UA negative    Spec Grav, UA 1.020 1.010 - 1.025   Blood, UA negative    pH, UA 6.0 5.0 - 8.0   Protein, UA Negative Negative   Urobilinogen, UA 0.2 (A) 0.2 or 1.0 E.U./dL   Nitrite, UA negative    Leukocytes, UA Negative Negative   Appearance clear    Odor no       Assessment & Plan:  As per problem list, treat prostatitis with an additional 2 wks of bactrim Problem List Items Addressed This Visit   None Visit Diagnoses       Urinary tract infection without hematuria, site unspecified    -  Primary   Relevant Medications   sulfamethoxazole-trimethoprim (BACTRIM DS) 800-160 MG tablet   Other Relevant Orders   POCT Urinalysis Dipstick ([id number]) (Completed)     Other constipation       Relevant Medications   polyethylene glycol (MIRALAX) 17 g packet     Chronic prostatitis       Relevant Medications   sulfamethoxazole-trimethoprim (  BACTRIM DS) 800-160 MG tablet     Other hemorrhoids           Return in  about 3 weeks (around 05/25/2023) for memory and prostate fu.   Total time spent: 20 minutes  Luna Fuse, MD  05/04/2023   This document may have been prepared by [facility] Voice Recognition software and as such may include unintentional dictation errors.

## 2023-05-06 ENCOUNTER — Ambulatory Visit: Payer: Medicare Other | Admitting: Internal Medicine

## 2023-05-11 ENCOUNTER — Inpatient Hospital Stay (HOSPITAL_COMMUNITY)
Admission: EM | Admit: 2023-05-11 | Discharge: 2023-06-23 | DRG: 064 | Disposition: A | Discharge status: Skilled Nursing Facility | Payer: Medicare Other | Source: Other Acute Inpatient Hospital | Attending: Internal Medicine | Admitting: Internal Medicine

## 2023-05-11 ENCOUNTER — Emergency Department: Payer: Medicare Other

## 2023-05-11 ENCOUNTER — Encounter (HOSPITAL_COMMUNITY): Payer: Self-pay

## 2023-05-11 ENCOUNTER — Emergency Department
Admission: EM | Admit: 2023-05-11 | Discharge: 2023-05-11 | Disposition: A | Discharge status: Other Acute Inpatient Hospital | Payer: Medicare Other | Attending: Emergency Medicine | Admitting: Emergency Medicine

## 2023-05-11 ENCOUNTER — Other Ambulatory Visit: Payer: Self-pay

## 2023-05-11 ENCOUNTER — Inpatient Hospital Stay (HOSPITAL_COMMUNITY): Payer: Medicare Other

## 2023-05-11 DIAGNOSIS — G910 Communicating hydrocephalus: Secondary | ICD-10-CM | POA: Diagnosis present

## 2023-05-11 DIAGNOSIS — R7309 Other abnormal glucose: Secondary | ICD-10-CM | POA: Insufficient documentation

## 2023-05-11 DIAGNOSIS — R58 Hemorrhage, not elsewhere classified: Secondary | ICD-10-CM | POA: Diagnosis not present

## 2023-05-11 DIAGNOSIS — Z87440 Personal history of urinary (tract) infections: Secondary | ICD-10-CM | POA: Diagnosis not present

## 2023-05-11 DIAGNOSIS — K8020 Calculus of gallbladder without cholecystitis without obstruction: Secondary | ICD-10-CM | POA: Diagnosis not present

## 2023-05-11 DIAGNOSIS — Z1629 Resistance to other single specified antibiotic: Secondary | ICD-10-CM | POA: Diagnosis present

## 2023-05-11 DIAGNOSIS — J9601 Acute respiratory failure with hypoxia: Secondary | ICD-10-CM | POA: Diagnosis not present

## 2023-05-11 DIAGNOSIS — I629 Nontraumatic intracranial hemorrhage, unspecified: Secondary | ICD-10-CM | POA: Diagnosis not present

## 2023-05-11 DIAGNOSIS — Z79899 Other long term (current) drug therapy: Secondary | ICD-10-CM

## 2023-05-11 DIAGNOSIS — I618 Other nontraumatic intracerebral hemorrhage: Secondary | ICD-10-CM | POA: Insufficient documentation

## 2023-05-11 DIAGNOSIS — B9620 Unspecified Escherichia coli [E. coli] as the cause of diseases classified elsewhere: Secondary | ICD-10-CM | POA: Diagnosis present

## 2023-05-11 DIAGNOSIS — I63519 Cerebral infarction due to unspecified occlusion or stenosis of unspecified middle cerebral artery: Secondary | ICD-10-CM | POA: Diagnosis not present

## 2023-05-11 DIAGNOSIS — F03A0 Unspecified dementia, mild, without behavioral disturbance, psychotic disturbance, mood disturbance, and anxiety: Secondary | ICD-10-CM | POA: Diagnosis not present

## 2023-05-11 DIAGNOSIS — I6782 Cerebral ischemia: Secondary | ICD-10-CM | POA: Diagnosis not present

## 2023-05-11 DIAGNOSIS — Z66 Do not resuscitate: Secondary | ICD-10-CM | POA: Diagnosis not present

## 2023-05-11 DIAGNOSIS — N179 Acute kidney failure, unspecified: Secondary | ICD-10-CM | POA: Diagnosis not present

## 2023-05-11 DIAGNOSIS — B351 Tinea unguium: Secondary | ICD-10-CM | POA: Diagnosis not present

## 2023-05-11 DIAGNOSIS — F0390 Unspecified dementia without behavioral disturbance: Secondary | ICD-10-CM | POA: Diagnosis not present

## 2023-05-11 DIAGNOSIS — I639 Cerebral infarction, unspecified: Principal | ICD-10-CM | POA: Diagnosis present

## 2023-05-11 DIAGNOSIS — I615 Nontraumatic intracerebral hemorrhage, intraventricular: Secondary | ICD-10-CM | POA: Diagnosis present

## 2023-05-11 DIAGNOSIS — R269 Unspecified abnormalities of gait and mobility: Secondary | ICD-10-CM | POA: Diagnosis not present

## 2023-05-11 DIAGNOSIS — R930 Abnormal findings on diagnostic imaging of skull and head, not elsewhere classified: Secondary | ICD-10-CM | POA: Diagnosis not present

## 2023-05-11 DIAGNOSIS — I771 Stricture of artery: Secondary | ICD-10-CM | POA: Diagnosis not present

## 2023-05-11 DIAGNOSIS — R1310 Dysphagia, unspecified: Secondary | ICD-10-CM | POA: Diagnosis not present

## 2023-05-11 DIAGNOSIS — Z7401 Bed confinement status: Secondary | ICD-10-CM | POA: Diagnosis not present

## 2023-05-11 DIAGNOSIS — J984 Other disorders of lung: Secondary | ICD-10-CM | POA: Diagnosis not present

## 2023-05-11 DIAGNOSIS — I680 Cerebral amyloid angiopathy: Secondary | ICD-10-CM | POA: Diagnosis not present

## 2023-05-11 DIAGNOSIS — I700 Atherosclerosis of aorta: Secondary | ICD-10-CM | POA: Diagnosis present

## 2023-05-11 DIAGNOSIS — R4781 Slurred speech: Secondary | ICD-10-CM | POA: Diagnosis not present

## 2023-05-11 DIAGNOSIS — S06310A Contusion and laceration of right cerebrum without loss of consciousness, initial encounter: Secondary | ICD-10-CM

## 2023-05-11 DIAGNOSIS — R0989 Other specified symptoms and signs involving the circulatory and respiratory systems: Secondary | ICD-10-CM | POA: Diagnosis not present

## 2023-05-11 DIAGNOSIS — I6389 Other cerebral infarction: Secondary | ICD-10-CM | POA: Diagnosis not present

## 2023-05-11 DIAGNOSIS — Z681 Body mass index (BMI) 19 or less, adult: Secondary | ICD-10-CM | POA: Diagnosis not present

## 2023-05-11 DIAGNOSIS — I619 Nontraumatic intracerebral hemorrhage, unspecified: Secondary | ICD-10-CM | POA: Diagnosis not present

## 2023-05-11 DIAGNOSIS — B370 Candidal stomatitis: Secondary | ICD-10-CM | POA: Diagnosis not present

## 2023-05-11 DIAGNOSIS — I610 Nontraumatic intracerebral hemorrhage in hemisphere, subcortical: Secondary | ICD-10-CM | POA: Diagnosis not present

## 2023-05-11 DIAGNOSIS — F03C18 Unspecified dementia, severe, with other behavioral disturbance: Secondary | ICD-10-CM | POA: Diagnosis present

## 2023-05-11 DIAGNOSIS — R509 Fever, unspecified: Secondary | ICD-10-CM | POA: Diagnosis not present

## 2023-05-11 DIAGNOSIS — R1311 Dysphagia, oral phase: Secondary | ICD-10-CM | POA: Diagnosis not present

## 2023-05-11 DIAGNOSIS — Z7951 Long term (current) use of inhaled steroids: Secondary | ICD-10-CM

## 2023-05-11 DIAGNOSIS — Z4682 Encounter for fitting and adjustment of non-vascular catheter: Secondary | ICD-10-CM | POA: Diagnosis not present

## 2023-05-11 DIAGNOSIS — R9089 Other abnormal findings on diagnostic imaging of central nervous system: Secondary | ICD-10-CM | POA: Diagnosis not present

## 2023-05-11 DIAGNOSIS — Z515 Encounter for palliative care: Secondary | ICD-10-CM

## 2023-05-11 DIAGNOSIS — E43 Unspecified severe protein-calorie malnutrition: Secondary | ICD-10-CM | POA: Diagnosis not present

## 2023-05-11 DIAGNOSIS — Z23 Encounter for immunization: Secondary | ICD-10-CM | POA: Diagnosis present

## 2023-05-11 DIAGNOSIS — N390 Urinary tract infection, site not specified: Secondary | ICD-10-CM | POA: Diagnosis not present

## 2023-05-11 DIAGNOSIS — J690 Pneumonitis due to inhalation of food and vomit: Secondary | ICD-10-CM | POA: Diagnosis present

## 2023-05-11 DIAGNOSIS — E854 Organ-limited amyloidosis: Secondary | ICD-10-CM | POA: Diagnosis present

## 2023-05-11 DIAGNOSIS — Z8673 Personal history of transient ischemic attack (TIA), and cerebral infarction without residual deficits: Secondary | ICD-10-CM

## 2023-05-11 DIAGNOSIS — R270 Ataxia, unspecified: Secondary | ICD-10-CM | POA: Diagnosis not present

## 2023-05-11 DIAGNOSIS — R29710 NIHSS score 10: Secondary | ICD-10-CM | POA: Diagnosis present

## 2023-05-11 DIAGNOSIS — R918 Other nonspecific abnormal finding of lung field: Secondary | ICD-10-CM | POA: Diagnosis not present

## 2023-05-11 DIAGNOSIS — I10 Essential (primary) hypertension: Secondary | ICD-10-CM | POA: Insufficient documentation

## 2023-05-11 DIAGNOSIS — E782 Mixed hyperlipidemia: Secondary | ICD-10-CM

## 2023-05-11 DIAGNOSIS — R29810 Facial weakness: Secondary | ICD-10-CM | POA: Diagnosis present

## 2023-05-11 DIAGNOSIS — E785 Hyperlipidemia, unspecified: Secondary | ICD-10-CM | POA: Diagnosis not present

## 2023-05-11 DIAGNOSIS — Z91014 Allergy to mammalian meats: Secondary | ICD-10-CM

## 2023-05-11 DIAGNOSIS — R4701 Aphasia: Secondary | ICD-10-CM | POA: Diagnosis not present

## 2023-05-11 DIAGNOSIS — I69891 Dysphagia following other cerebrovascular disease: Secondary | ICD-10-CM | POA: Diagnosis not present

## 2023-05-11 DIAGNOSIS — R410 Disorientation, unspecified: Secondary | ICD-10-CM | POA: Diagnosis not present

## 2023-05-11 DIAGNOSIS — R82998 Other abnormal findings in urine: Secondary | ICD-10-CM | POA: Diagnosis not present

## 2023-05-11 DIAGNOSIS — Z7189 Other specified counseling: Secondary | ICD-10-CM | POA: Diagnosis not present

## 2023-05-11 DIAGNOSIS — M6281 Muscle weakness (generalized): Secondary | ICD-10-CM | POA: Diagnosis not present

## 2023-05-11 DIAGNOSIS — R404 Transient alteration of awareness: Secondary | ICD-10-CM | POA: Diagnosis not present

## 2023-05-11 DIAGNOSIS — R051 Acute cough: Secondary | ICD-10-CM | POA: Diagnosis not present

## 2023-05-11 DIAGNOSIS — R471 Dysarthria and anarthria: Secondary | ICD-10-CM | POA: Insufficient documentation

## 2023-05-11 DIAGNOSIS — R1319 Other dysphagia: Secondary | ICD-10-CM | POA: Diagnosis not present

## 2023-05-11 DIAGNOSIS — G9389 Other specified disorders of brain: Secondary | ICD-10-CM | POA: Diagnosis not present

## 2023-05-11 DIAGNOSIS — R000 Tachycardia, unspecified: Secondary | ICD-10-CM | POA: Diagnosis present

## 2023-05-11 DIAGNOSIS — R262 Difficulty in walking, not elsewhere classified: Secondary | ICD-10-CM | POA: Diagnosis not present

## 2023-05-11 DIAGNOSIS — G936 Cerebral edema: Secondary | ICD-10-CM | POA: Diagnosis not present

## 2023-05-11 HISTORY — DX: Cerebral infarction, unspecified: I63.9

## 2023-05-11 HISTORY — DX: Unspecified dementia, unspecified severity, without behavioral disturbance, psychotic disturbance, mood disturbance, and anxiety: F03.90

## 2023-05-11 HISTORY — DX: Pneumonia, unspecified organism: J18.9

## 2023-05-11 HISTORY — DX: Essential (primary) hypertension: I10

## 2023-05-11 LAB — DIFFERENTIAL
Abs Immature Granulocytes: 0.01 10*3/uL (ref 0.00–0.07)
Basophils Absolute: 0 10*3/uL (ref 0.0–0.1)
Basophils Relative: 1 %
Eosinophils Absolute: 0.1 10*3/uL (ref 0.0–0.5)
Eosinophils Relative: 1 %
Immature Granulocytes: 0 %
Lymphocytes Relative: 42 %
Lymphs Abs: 2.8 10*3/uL (ref 0.7–4.0)
Monocytes Absolute: 0.8 10*3/uL (ref 0.1–1.0)
Monocytes Relative: 12 %
Neutro Abs: 3 10*3/uL (ref 1.7–7.7)
Neutrophils Relative %: 44 %

## 2023-05-11 LAB — CBC
HCT: 34.9 % — ABNORMAL LOW (ref 39.0–52.0)
Hemoglobin: 12 g/dL — ABNORMAL LOW (ref 13.0–17.0)
MCH: 28.5 pg (ref 26.0–34.0)
MCHC: 34.4 g/dL (ref 30.0–36.0)
MCV: 82.9 fL (ref 80.0–100.0)
Platelets: 286 10*3/uL (ref 150–400)
RBC: 4.21 MIL/uL — ABNORMAL LOW (ref 4.22–5.81)
RDW: 14.9 % (ref 11.5–15.5)
WBC: 6.7 10*3/uL (ref 4.0–10.5)
nRBC: 0 % (ref 0.0–0.2)

## 2023-05-11 LAB — COMPREHENSIVE METABOLIC PANEL
ALT: 21 U/L (ref 0–44)
AST: 24 U/L (ref 15–41)
Albumin: 4.3 g/dL (ref 3.5–5.0)
Alkaline Phosphatase: 73 U/L (ref 38–126)
Anion gap: 12 (ref 5–15)
BUN: 29 mg/dL — ABNORMAL HIGH (ref 8–23)
CO2: 24 mmol/L (ref 22–32)
Calcium: 9.6 mg/dL (ref 8.9–10.3)
Chloride: 101 mmol/L (ref 98–111)
Creatinine, Ser: 1.47 mg/dL — ABNORMAL HIGH (ref 0.61–1.24)
GFR, Estimated: 46 mL/min — ABNORMAL LOW (ref >60)
Glucose, Bld: 86 mg/dL (ref 70–99)
Potassium: 4.8 mmol/L (ref 3.5–5.1)
Sodium: 137 mmol/L (ref 135–145)
Total Bilirubin: 0.5 mg/dL (ref 0.0–1.2)
Total Protein: 8 g/dL (ref 6.5–8.1)

## 2023-05-11 LAB — PROTIME-INR
INR: 1 (ref 0.8–1.2)
Prothrombin Time: 13.4 s (ref 11.4–15.2)

## 2023-05-11 LAB — ETHANOL: Alcohol, Ethyl (B): <10 mg/dL (ref <10)

## 2023-05-11 LAB — APTT: aPTT: 32 s (ref 24–36)

## 2023-05-11 LAB — MRSA NEXT GEN BY PCR, NASAL: MRSA by PCR Next Gen: NOT DETECTED

## 2023-05-11 LAB — CBG MONITORING, ED: Glucose-Capillary: 80 mg/dL (ref 70–99)

## 2023-05-11 MED ORDER — CHLORHEXIDINE GLUCONATE CLOTH 2 % EX PADS
6.0000 | MEDICATED_PAD | Freq: Every day | CUTANEOUS | Status: DC
  Administered 2023-05-11 – 2023-05-18 (×9): 6 via TOPICAL

## 2023-05-11 MED ORDER — SODIUM CHLORIDE 0.9% FLUSH
3.0000 mL | Freq: Once | INTRAVENOUS | Status: AC
  Administered 2023-05-11: 3 mL via INTRAVENOUS

## 2023-05-11 MED ORDER — ACETAMINOPHEN 325 MG PO TABS
650.0000 mg | ORAL_TABLET | ORAL | Status: DC | PRN
  Administered 2023-05-17 – 2023-05-28 (×4): 650 mg via ORAL
  Filled 2023-05-11 (×4): qty 2

## 2023-05-11 MED ORDER — SODIUM CHLORIDE 0.9 % IV SOLN
1.0000 g | Freq: Every day | INTRAVENOUS | Status: AC
  Administered 2023-05-11 – 2023-05-12 (×2): 1 g via INTRAVENOUS
  Filled 2023-05-11 (×2): qty 10

## 2023-05-11 MED ORDER — PANTOPRAZOLE SODIUM 40 MG IV SOLR
40.0000 mg | Freq: Every day | INTRAVENOUS | Status: DC
  Administered 2023-05-11 – 2023-06-13 (×34): 40 mg via INTRAVENOUS
  Filled 2023-05-11 (×34): qty 10

## 2023-05-11 MED ORDER — ACETAMINOPHEN 160 MG/5ML PO SOLN
650.0000 mg | ORAL | Status: DC | PRN
  Administered 2023-05-13 – 2023-06-03 (×27): 650 mg
  Filled 2023-05-11 (×27): qty 20.3

## 2023-05-11 MED ORDER — CLEVIDIPINE BUTYRATE 0.5 MG/ML IV EMUL: 0.0000 mg/h | INTRAVENOUS | Status: DC | PRN

## 2023-05-11 MED ORDER — CLEVIDIPINE BUTYRATE 0.5 MG/ML IV EMUL: 0.0000 mg/h | INTRAVENOUS | Status: DC

## 2023-05-11 MED ORDER — SENNOSIDES-DOCUSATE SODIUM 8.6-50 MG PO TABS
1.0000 | ORAL_TABLET | Freq: Two times a day (BID) | ORAL | Status: DC
  Filled 2023-05-11: qty 1

## 2023-05-11 MED ORDER — ACETAMINOPHEN 650 MG RE SUPP: 650.0000 mg | RECTAL | Status: DC | PRN

## 2023-05-11 MED ORDER — CLEVIDIPINE BUTYRATE 0.5 MG/ML IV EMUL
INTRAVENOUS | Status: AC
  Filled 2023-05-11: qty 50

## 2023-05-11 MED ORDER — ORAL CARE MOUTH RINSE: 15.0000 mL | OROMUCOSAL | Status: DC | PRN

## 2023-05-11 MED ORDER — STROKE: EARLY STAGES OF RECOVERY BOOK: Freq: Once | Status: AC

## 2023-05-11 NOTE — ED Notes (Signed)
Still waiting for bed assignment.

## 2023-05-11 NOTE — Plan of Care (Signed)
[facility] Neurologist Chart Note  As of 1841 hrs, still no beds at [facility].  Discussed with Dr. Otelia Limes a little while ago - patient is stable in the ER. No emergent need for ED to ED transfer. Will be seen and evaluated upon arrival to ICU (neurology service primary). In the interim, stability scan will be obtained at 1945 hrs if still there at [facility], otherwise will need repeat imaging upon arrival at [facility] per the discretion of the on call.   Milon Dikes, MD

## 2023-05-11 NOTE — H&P (Signed)
NEUROLOGY H&P NOTE   Date of service: May 11, 2023 Patient Name: Derek Yates MRN:  [id number] DOB:  April 26, 1940 Chief Complaint: "Difficulty ambulating"  History of Present Illness  Derek Yates is a 83 y.o. male with hx of hypertension, CVA, recurrent UTIs, dementia who presented to [facility] as a code stroke due to speech deficits.  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   Last known well: 11 AM Modified rankin score: 1 ICH Score: 2 tNKASE: Not offered due to ICH Thrombectomy: not offered due to ICH NIHSS components Score: Comment  1a Level of Conscious 0[]  1[x]  2[]  3[]      1b LOC Questions 0[]  1[x]  2[]       1c LOC Commands 0[x]  1[]  2[]       2 Best Gaze 0[]  1[x]  2[]       3 Visual 0[]  1[]  2[x]  3[]      4 Facial Palsy 0[]  1[x]  2[]  3[]      5a Motor Arm - left 0[]  1[x]  2[]  3[]  4[]  UN[]    5b Motor Arm - Right 0[x]  1[]  2[]  3[]  4[]  UN[]    6a Motor Leg - Left 0[]  1[x]  2[]  3[]  4[]  UN[]    6b Motor Leg - Right 0[x]  1[]  2[]  3[]  4[]  UN[]    7 Limb Ataxia 0[x]  1[]  2[]  3[]  UN[]     8 Sensory 0[]  1[x]  2[]  UN[]      9 Best Language 0[x]  1[]  2[]  3[]      10 Dysarthria 0[x]  1[]  2[]  UN[]      11 Extinct. and Inattention 0[]  1[x]  2[]       TOTAL: 10       Social History   Socioeconomic History   Marital status: Married    Spouse name: Not on file   Number of children: Not on file   Years of education: Not on file   Highest education level: Not on file  Occupational History   Not on file  Tobacco Use   Smoking status: Never   Smokeless tobacco: Never  Substance and Sexual Activity   Alcohol use: Not on file   Drug use: Not on file   Sexual activity: Not on file  Other Topics Concern   Not on file  Social History Narrative   Not on file   Social Drivers of Health   Financial Resource Strain: Low Risk  (06/20/2021)   Received from [facility], [facility], [facility]    Overall Financial Resource Strain (CARDIA)    Difficulty of Paying Living Expenses: Not hard at all  Food Insecurity: No Food Insecurity (06/20/2021)   Received from [facility], [facility]   Hunger Vital Sign    Worried About Running Out of Food in the Last Year: Never true    Ran Out of Food in the Last Year: Never true  Transportation Needs: No Transportation Needs (06/20/2021)   Received from [facility], [facility]   [facility] - Transportation    Lack of Transportation (Medical): No    Lack of Transportation (Non-Medical): No  Physical Activity: Not on file  Stress: Not on file  Social Connections: Not on file   No Known Allergies  Medications   Medications Prior to Admission  Medication Sig Dispense Refill Last Dose/Taking   atorvastatin (LIPITOR) 10 MG tablet TAKE 1 TABLET BY MOUTH DAILY 90 tablet 1    bisacodyl (DULCOLAX) 10 MG suppository Place 1 suppository (10 mg  total) rectally as needed for moderate constipation. 12 suppository 0    diazepam (VALIUM) 2 MG tablet TAKE 1 TABLET BY MOUTH DAILY 90 tablet 1    diclofenac Sodium (VOLTAREN) 1 % GEL Apply 8 g topically 2 (two) times daily. 4 g to each knee twice a day 500 g 2    donepezil (ARICEPT) 5 MG tablet Take 1 tablet (5 mg total) by mouth at bedtime. 90 tablet 0    fluticasone furoate-vilanterol (BREO ELLIPTA) 100-25 MCG/ACT AEPB Inhale 1 puff into the lungs daily.      NIFEdipine (PROCARDIA-XL/NIFEDICAL-XL) 30 MG 24 hr tablet TAKE 1 TABLET BY MOUTH IN THE  MORNING 90 tablet 3    pantoprazole (PROTONIX) 40 MG tablet Take by mouth. (Patient not taking: Reported on 06/01/2022)      polyethylene glycol (MIRALAX) 17 g packet Take 17 g by mouth daily for 14 days. Max 7 days at a time 7 each 1    pramoxine (PROCTOFOAM) 1 % foam Place 1 Application rectally 3 (three) times daily as needed for anal itching. 15 g 0    sulfamethoxazole-trimethoprim (BACTRIM DS) 800-160 MG tablet Take 1 tablet by mouth 2 (two) times daily  for 7 days. 14 tablet 0    tamsulosin (FLOMAX) 0.4 MG CAPS capsule Take by mouth. (Patient not taking: Reported on 06/01/2022)        Vitals   Vitals:   05/11/23 2045 05/11/23 2100 05/11/23 2115 05/11/23 2130  BP: 135/67 136/70 125/87 (!) 140/69  Pulse: 82 81 82 80  Resp: 19 15 17  (!) 26  Temp:      TempSrc:      SpO2: 96% 99% 90% 96%     There is no height or weight on file to calculate BMI.  Physical Exam   Constitutional: Appears well-developed and well-nourished.  Neurologic Examination   Neuro: Mental Status: Patient keeps his eyes closed despite being awake, he is able to tell me his age but gives the month as may No signs of aphasia, but I do suspect he has some neglect Cranial Nerves: II: Left hemianopia. Pupils are equal, round, and reactive to light.   III,IV, VI: He is able to cross midline to the left, but is not able to fully look to that side V: Facial sensation is symmetric to temperature VII: Facial movement is symmetric.  VIII: hearing is intact to voice X: Uvula elevates symmetrically XII: tongue is midline without atrophy or fasciculations.  Motor: Tone is normal. Bulk is normal. 5/5 strength was present in all four extremities. *** Sensory: Sensation is symmetric to light touch and temperature in the arms and legs.*** Deep Tendon Reflexes: 2+ and symmetric in the biceps and patellae. *** Plantars: Toes are downgoing bilaterally. *** Cerebellar: FNF and HKS are intact bilaterally***        Labs   CBC:  Recent Labs  Lab 05/11/23 1344  WBC 6.7  NEUTROABS 3.0  HGB 12.0*  HCT 34.9*  MCV 82.9  PLT 286    Basic Metabolic Panel:  Lab Results  Component Value Date   NA 137 05/11/2023   K 4.8 05/11/2023   CO2 24 05/11/2023   GLUCOSE 86 05/11/2023   BUN 29 (H) 05/11/2023   CREATININE 1.47 (H) 05/11/2023   CALCIUM 9.6 05/11/2023   GFRNONAA 46 (L) 05/11/2023   Lipid Panel:  Lab Results  Component Value Date   LDLCALC 66 02/03/2023    HgbA1c: No results found for: "HGBA1C" Urine Drug Screen: No  results found for: "LABOPIA", "COCAINSCRNUR", "LABBENZ", "AMPHETMU", "THCU", "LABBARB"  Alcohol Level     Component Value Date/Time   ETH <10 05/11/2023 1344   INR  Lab Results  Component Value Date   INR 1.0 05/11/2023   APTT  Lab Results  Component Value Date   APTT 32 05/11/2023     CT Head without contrast(Personally reviewed): ***  CT angio Head and Neck with contrast(Personally reviewed): ***  MR Angio head without contrast and Carotid Duplex BL(Personally reviewed): ***  MRI Brain(Personally reviewed): ***  rEEG:  ***  Impression   BRANSYN ADAMI is a 83 y.o. male ***  Primary Diagnosis:  {ICH:22346}  Secondary Diagnosis: {Stroke Comorbidities:21266}  Recommendations  *** ______________________________________________________________________   Derek Card, MD [facility]

## 2023-05-11 NOTE — ED Triage Notes (Signed)
Pt to ED via [facility] from home. Code Stroke called prior to arrival at 1334. Left sided arm drift noted by EMS. Gait change and speech change per family. LKW 1100.

## 2023-05-11 NOTE — ED Notes (Signed)
CARELINK  CALLED  PER  DR  RAY  MD

## 2023-05-11 NOTE — ED Notes (Signed)
Pt incontinent of urine. Fresh bed linens on bed and new brief on pt

## 2023-05-11 NOTE — ED Provider Notes (Signed)
[facility] Provider Note    Event Date/Time   First MD Initiated Contact with Patient 05/11/23 1343     (approximate)   History   Code Stroke   HPI  Derek Yates is a  83 year old male with history of HTN, prior CVA, recurrent UTIs presenting to the emergency department for evaluation of slurred speech as a code stroke.  Reported last known normal at 11 AM where patient was found to have slurred speech.  With EMS, he was noted to have left-sided arm drift and veering to the left with walking.  Reportedly has been on antibiotic for the last 10 days for UTI.      Physical Exam   Triage Vital Signs: ED Triage Vitals  Encounter Vitals Group     BP 05/11/23 1404 (!) 140/75     Systolic BP Percentile --      Diastolic BP Percentile --      Pulse Rate 05/11/23 1404 72     Resp 05/11/23 1404 (!) 25     Temp 05/11/23 1423 98.4 F (36.9 C)     Temp Source 05/11/23 1423 Oral     SpO2 05/11/23 1404 96 %     Weight 05/11/23 1406 146 lb 13.2 oz (66.6 kg)     Height 05/11/23 1406 5\' 8"  (1.727 m)     Head Circumference --      Peak Flow --      Pain Score 05/11/23 1405 0     Pain Loc --      Pain Education --      Exclude from Growth Chart --     Most recent vital signs: Vitals:   05/11/23 1445 05/11/23 1500  BP: (!) 142/82 139/72  Pulse: 71 76  Resp: 17 15  Temp:    SpO2:  97%     General: Somnolent but arousable CV:  Regular rate, good peripheral perfusion.  Resp:  Unlabored respirations lungs clear to auscultation Abd:  Nondistended.  Neuro:  Somnolent but arouses to voice, correctly answers age, thinks the month is May 2025, able to blink eyes and squeeze hands, normal horizontal extraocular movements, no appreciable field cut, but does not blink to threat on the left, no gross facial asymmetry, does have drift of the left arm and leg, no drift of the right sided extremities.  Limited evaluation of left side due to weakness and  participation, but no appreciable ataxia.  Sensation intact.  Mild dysarthria.  No aphasia.  Left-sided extinction.   ED Results / Procedures / Treatments   Labs (all labs ordered are listed, but only abnormal results are displayed) Labs Reviewed  CBC - Abnormal; Notable for the following components:      Result Value   RBC 4.21 (*)    Hemoglobin 12.0 (*)    HCT 34.9 (*)    All other components within normal limits  COMPREHENSIVE METABOLIC PANEL - Abnormal; Notable for the following components:   BUN 29 (*)    Creatinine, Ser 1.47 (*)    GFR, Estimated 46 (*)    All other components within normal limits  PROTIME-INR  APTT  DIFFERENTIAL  ETHANOL  CBG MONITORING, ED  CBG MONITORING, ED     EKG EKG independently reviewed interpreted by myself (ER attending) demonstrates:  EKG demonstrates sinus rhythm at a rate of 71, PR 60, QRS 119, QTc 250, no acute ST changes on my review, interpreted by computer as acute MI, but I  do not see evidence of this, suspect likely due to baseline wander  RADIOLOGY Imaging independently reviewed and interpreted by myself demonstrates:  CT head demonstrates an acute intraparenchymal bleed in the right caudate nucleus with intraventricular extension as well as evidence of prior infarcts  PROCEDURES:  Critical Care performed: Yes, see critical care procedure note(s)  CRITICAL CARE Performed by: Trinna Post   Total critical care time: 32 minutes  Critical care time was exclusive of separately billable procedures and treating other patients.  Critical care was necessary to treat or prevent imminent or life-threatening deterioration.  Critical care was time spent personally by me on the following activities: development of treatment plan with patient and/or surrogate as well as nursing, discussions with consultants, evaluation of patient's response to treatment, examination of patient, obtaining history from patient or surrogate, ordering and  performing treatments and interventions, ordering and review of laboratory studies, ordering and review of radiographic studies, pulse oximetry and re-evaluation of patient's condition.   Procedures   MEDICATIONS ORDERED IN ED: Medications  clevidipine (CLEVIPREX) 0.5 MG/ML infusion (has no administration in time range)  clevidipine (CLEVIPREX) infusion 0.5 mg/mL (has no administration in time range)  sodium chloride flush (NS) 0.9 % injection 3 mL (3 mLs Intravenous Given 05/11/23 1400)     IMPRESSION / MDM / ASSESSMENT AND PLAN / ED COURSE  I reviewed the triage vital signs and the nursing notes.  Differential diagnosis includes, but is not limited to, acute CVA, intracranial bleed, TIA, UTI, other metabolic encephalopathy  [name] presentation is most consistent with acute presentation with potential threat to life or bodily function.  83 year old male presenting as a code stroke with left-sided weakness.  Stable vitals on presentation.  Labs without critical derangements.  CT did demonstrate a right IPH with intraventricular extension.  Reviewed with Dr. Otelia Limes with neurology here.  He did recommend transfer to [facility] for neuro ICU monitoring.  Case was reviewed with Dr. Wilford Corner with neurology at [facility].  No beds currently available, but does accept the patient in transfer.  He did request that the patient's images be reviewed with neurosurgery here for consideration of possible EVD placement.  Case was discussed with Dr. Marcell Barlow.  He is currently in the OR, but based on clinical history likely would not recommend EVD.  However, he will review the patient's images when he is able.  Patient reassessed, remains somnolent, but following commands.  GCS of 13 on my evaluation.  Family including nephew and wife present.  They do request that the patient remain a full code currently.  I do not feel that patient currently requires airway intervention based on mental status, but oncoming physician  made aware of patient should patient have mental status deterioration.    FINAL CLINICAL IMPRESSION(S) / ED DIAGNOSES   Final diagnoses:  Intraparenchymal hematoma of brain, right, without loss of consciousness, initial encounter (HCC)     Rx / DC Orders   ED Discharge Orders     None        Note:  This document was prepared using Dragon voice recognition software and may include unintentional dictation errors., recurrent UTIs presenting to the emergencyl I do not break   Trinna Post, MD 05/11/23 1531

## 2023-05-11 NOTE — Consult Note (Addendum)
NEUROLOGY CONSULT NOTE   Date of service: May 11, 2023 Patient Name: Derek Yates MRN:  [id number] DOB:  07-27-1940 Chief Complaint: Speech deficit, left sided drift and difficulty ambulating Requesting Provider: Trinna Post, MD  History of Present Illness  Derek Yates is an 83 y.o. 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 LKN was 1100 at breakfast, although he did seem tired and diffusely weak at that time per his granddaughter. On EMS arrival, his speech was intermittently garbled and he was listing to the left when they stood him up.   On arrival to the ED he had intermittently garbled speech, left facial droop and LUE drift. He was also drowsy and a poor historian, but was able to state that he did not have a headache.   He was recently started on Bactrim for a presumed UTI and has about 2 days left of this medication, per granddaughter.   He recently returned from a trip to [place] where he attended a large wedding. He did have some UTI symptoms while there and was prescribed an antibiotic while abroad.   CT head reveals an acute right caudate ICH with intraventricular extension.   LKW: 1100 Modified rankin score: 1-No significant post stroke disability and can perform usual duties with stroke symptoms ICH Score:2  NIHSS components Score: Comment  1a Level of Conscious 0[]  1[x]  2[]  3[]      1b LOC Questions 0[x]  1[]  2[]       1c LOC Commands 0[x]  1[]  2[]       2 Best Gaze 0[x]  1[]  2[]       3 Visual 0[]  1[]  2[x]  3[]     Left field cut  4 Facial Palsy 0[]  1[x]  2[]  3[]      5a Motor Arm - left 0[]  1[x]  2[]  3[]  4[]  UN[]    5b Motor Arm - Right 0[x]  1[]  2[]   3[]  4[]  UN[]    6a Motor Leg - Left 0[]  1[x]  2[]  3[]  4[]  UN[]    6b Motor Leg - Right 0[x]  1[]  2[]  3[]  4[]  UN[]    7 Limb Ataxia 0[x]  1[]  2[]  3[]  UN[]     8 Sensory 0[x]  1[]  2[]  UN[]      9 Best Language 0[x]  1[]  2[]  3[]      10 Dysarthria 0[x]  1[]  2[]  UN[]      11 Extinct. and Inattention 0[]  1[x]  2[]      Left sided extinction  TOTAL:    7      ROS  Unable to obtain a detailed ROS due to patient's drowsiness.    Past History   PMHx As per HPI  No past surgical history on file.  Family History: No family history on file.  Social History Married.  Lives at home with family  No Known Allergies  Medications   Current Facility-Administered Medications:    sodium chloride flush (NS) 0.9 % injection 3 mL, 3 mL, Intravenous, Once, Ray, Neha, MD  Current Outpatient Medications:    atorvastatin (LIPITOR) 10 MG tablet, TAKE 1 TABLET BY MOUTH DAILY, Disp: 90 tablet, Rfl: 1   bisacodyl (DULCOLAX) 10 MG suppository, Place 1 suppository (10 mg total) rectally as needed for moderate constipation., Disp: 12 suppository, Rfl: 0  diazepam (VALIUM) 2 MG tablet, TAKE 1 TABLET BY MOUTH DAILY, Disp: 90 tablet, Rfl: 1   diclofenac Sodium (VOLTAREN) 1 % GEL, Apply 8 g topically 2 (two) times daily. 4 g to each knee twice a day, Disp: 500 g, Rfl: 2   donepezil (ARICEPT) 5 MG tablet, Take 1 tablet (5 mg total) by mouth at bedtime., Disp: 90 tablet, Rfl: 0   fluticasone furoate-vilanterol (BREO ELLIPTA) 100-25 MCG/ACT AEPB, Inhale 1 puff into the lungs daily., Disp: , Rfl:    NIFEdipine (PROCARDIA-XL/NIFEDICAL-XL) 30 MG 24 hr tablet, TAKE 1 TABLET BY MOUTH IN THE  MORNING, Disp: 90 tablet, Rfl: 3   pantoprazole (PROTONIX) 40 MG tablet, Take by mouth. (Patient not taking: Reported on 06/01/2022), Disp: , Rfl:    polyethylene glycol (MIRALAX) 17 g packet, Take 17 g by mouth daily for 14 days. Max 7 days at a time, Disp: 7 each, Rfl: 1   pramoxine (PROCTOFOAM) 1 % foam, Place 1 Application rectally 3  (three) times daily as needed for anal itching., Disp: 15 g, Rfl: 0   sulfamethoxazole-trimethoprim (BACTRIM DS) 800-160 MG tablet, Take 1 tablet by mouth 2 (two) times daily for 7 days., Disp: 14 tablet, Rfl: 0   tamsulosin (FLOMAX) 0.4 MG CAPS capsule, Take by mouth. (Patient not taking: Reported on 06/01/2022), Disp: , Rfl:   Vitals  There were no vitals filed for this visit.  There is no height or weight on file to calculate BMI.  Physical Exam   Constitutional: Appears well-developed and well-nourished.  Eyes: No scleral injection.  HENT: No OP obstruction.  Head: Normocephalic.  Respiratory: Effort normal, non-labored breathing.    Neurologic Examination  Mental Status: Drowsy to somnolent. Poor attention. Has to be redirected frequently to keep his eyes open for exam. Speech is soft and slow, but fluent, although he did exhibit a short run of babbling speech on arrival prior to CT. Oriented to self, hospital, city, state, day of the week, year and "January or February". No dysarthria. Able to follow all commands but often requires repetition and coaching to keep his attention.  Cranial Nerves: II: Blinks to threat on some trials on the right. No definite blink to threat on the left. Right pupil 2 mm, left pupil 2.5 mm, both sluggishly reactive.  III,IV, VI: No ptosis. EOM are conjugate and able to gaze to left and right, but with saccadic pursuits and also does not fully bury his sclerae. No nystagmus. V: FT sensation equal bilaterally VII: Mild left facial droop VIII: Hearing intact to voice IX,X: Mildly hypophonic XI: Symmetric XII: Midline tongue extension Motor: RUE: 5/5 LUE: 4/5 with drift RLE: 5/5 LLE: 4/5 with drift Sensory: FT intact x 4. Positive for extinction on the left to DSS. Deep Tendon Reflexes: 2+ and symmetric bilateral biceps, brachioradialis and patellae Plantars: Right: downgoingLeft: Equivocal Cerebellar: Does not follow commands for definitive  assessment, but no gross ataxia noted Gait: Deferred   Labs/Imaging/Neurodiagnostic studies   CBC: No results for input(s): "WBC", "NEUTROABS", "HGB", "HCT", "MCV", "PLT" in the last 168 hours. Basic Metabolic Panel:  Lab Results  Component Value Date   NA 143 02/03/2023   K 4.1 02/03/2023   CO2 25 02/03/2023   GLUCOSE 89 02/03/2023   BUN 15 02/03/2023   CREATININE 0.98 02/03/2023   CALCIUM 9.8 02/03/2023   Lipid Panel:  Lab Results  Component Value Date   LDLCALC 66 02/03/2023     ASSESSMENT  83 y.o. 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 LKN was 1100 at breakfast, although he did seem tired and diffusely weak at that time per his granddaughter. On EMS arrival, his speech was intermittently garbled and he was listing to the left when they stood him up.  - Exam reveals left sided motor findings consistent with the location of his ICH, as well as drowsiness to somnolence, consistent with the extensive intraventricular hemorrhage seen on CT. NIHSS 7 - CT head: Acute intraparenchymal hematoma centered within the right caudate nucleus, measuring up to 2.6 x 2.3 x 1.0 cm (3 mL) with intraventricular extension casting the ventricles. Mild mass effect on the surrounding structures. No midline shift. Old cortical infarcts in the posterior aspect of the right ACA territory, including the medial aspect of the posterior right frontal lobe and right parietal lobe. Old cortical infarct in the left occipital lobe. Encephalomalacia in the anterior left frontal lobe may represent sequela of prior trauma or infarct. - Etiology for the acute hemorrhage is most likely hypertensive,  given that the nidus of the bleed is located within the right caudate     RECOMMENDATIONS  - Will need to be transferred to the Neuro ICU at [facility] for close monitoring.  - Neurosurgery is being consulted for possible EVD - Repeat CT head in 12 hours (2 AM), or sooner if his condition deteriorates - MRI/MRA of head - Carotid ultrasound - TTE - PT consult, OT consult, Speech consult - Cardiac telemetry - Frequent neuro checks - If mass effect develops, he may be a candidate for hypertonic saline - Family wishes him to be Full Code - BP management with clevidipine drip. SBP goal of 130-150 - No antiplatelet medications or anticoagulants - DVT prophylaxis with SCDs  Addendum: - Patient re-examined at 5:05 PM. Exam findings are essentially unchanged from previously, except for being slightly more alert with family in the room.  - Transfer to [facility] is still pending bed availability - Updated family at the bedside.  ______________________________________________________________________    Dessa Phi, Ladajah Soltys, MD [facility]

## 2023-05-11 NOTE — ED Notes (Signed)
[facility] called with bed assignment  [id number]  report call [phone]  [facility] to transport patient

## 2023-05-11 NOTE — ED Notes (Signed)
 ..  EMTALA: REQUIRED DOCUMENTATION COMPLETED AND REVIEWED BY WRITER PRIOR TO PT TRANSFER MD REASSESSMENT EMTALA RN SECTION TRANSFER E-SIGN VS WITHIN REQUIRED TIME

## 2023-05-11 NOTE — Progress Notes (Signed)
   05/11/23 1345  Spiritual Encounters  Type of Visit Initial  Care provided to: Patient  Conversation partners present during encounter Nurse  Referral source Code page  Reason for visit Code  OnCall Visit No  Spiritual Framework  Presenting Themes Other (comment) (Nurses were settling him in the ED 12A room after Code Stroke; no family present at this time; Pt speaks [place])

## 2023-05-11 NOTE — ED Notes (Signed)
Called [facility] they said patient waiting for room assignment  [phone]

## 2023-05-12 ENCOUNTER — Inpatient Hospital Stay (HOSPITAL_COMMUNITY): Payer: Medicare Other

## 2023-05-12 DIAGNOSIS — I10 Essential (primary) hypertension: Secondary | ICD-10-CM | POA: Diagnosis not present

## 2023-05-12 DIAGNOSIS — I610 Nontraumatic intracerebral hemorrhage in hemisphere, subcortical: Secondary | ICD-10-CM

## 2023-05-12 LAB — ECHOCARDIOGRAM COMPLETE
AR max vel: 3.03 cm2
AV Peak grad: 5.7 mm[Hg]
Ao pk vel: 1.19 m/s
Area-P 1/2: 3.23 cm2
Calc EF: 66.7 %
Height: 68 in
S' Lateral: 2.1 cm
Single Plane A2C EF: 65.3 %
Single Plane A4C EF: 68.1 %
Weight: 2183.44 [oz_av]

## 2023-05-12 LAB — BASIC METABOLIC PANEL
Anion gap: 12 (ref 5–15)
BUN: 17 mg/dL (ref 8–23)
CO2: 23 mmol/L (ref 22–32)
Calcium: 9.4 mg/dL (ref 8.9–10.3)
Chloride: 101 mmol/L (ref 98–111)
Creatinine, Ser: 1.4 mg/dL — ABNORMAL HIGH (ref 0.61–1.24)
GFR, Estimated: 49 mL/min — ABNORMAL LOW (ref >60)
Glucose, Bld: 101 mg/dL — ABNORMAL HIGH (ref 70–99)
Potassium: 4.6 mmol/L (ref 3.5–5.1)
Sodium: 136 mmol/L (ref 135–145)

## 2023-05-12 MED ORDER — PERFLUTREN LIPID MICROSPHERE
1.0000 mL | INTRAVENOUS | Status: AC | PRN
  Administered 2023-05-12: 3 mL via INTRAVENOUS

## 2023-05-12 MED ORDER — SODIUM CHLORIDE 0.9 % IV SOLN: INTRAVENOUS | Status: AC

## 2023-05-12 NOTE — H&P (Incomplete)
NEUROLOGY H&P NOTE   Date of service: May 11, 2023 Patient Name: [name] MRN:  [id number] DOB:  April 26, 1940 Chief Complaint: "Difficulty ambulating"  History of Present Illness  [name] is a 83 y.o. male with hx of hypertension, CVA, recurrent UTIs, dementia who presented to [facility] as a code stroke due to speech deficits.  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   Last known well: 11 AM Modified rankin score: 1 ICH Score: 2 tNKASE: Not offered due to ICH Thrombectomy: not offered due to ICH NIHSS components Score: Comment  1a Level of Conscious 0[]  1[x]  2[]  3[]      1b LOC Questions 0[]  1[x]  2[]       1c LOC Commands 0[x]  1[]  2[]       2 Best Gaze 0[]  1[x]  2[]       3 Visual 0[]  1[]  2[x]  3[]      4 Facial Palsy 0[]  1[x]  2[]  3[]      5a Motor Arm - left 0[]  1[x]  2[]  3[]  4[]  UN[]    5b Motor Arm - Right 0[x]  1[]  2[]  3[]  4[]  UN[]    6a Motor Leg - Left 0[]  1[x]  2[]  3[]  4[]  UN[]    6b Motor Leg - Right 0[x]  1[]  2[]  3[]  4[]  UN[]    7 Limb Ataxia 0[x]  1[]  2[]  3[]  UN[]     8 Sensory 0[]  1[x]  2[]  UN[]      9 Best Language 0[x]  1[]  2[]  3[]      10 Dysarthria 0[x]  1[]  2[]  UN[]      11 Extinct. and Inattention 0[]  1[x]  2[]       TOTAL: 10       Social History   Socioeconomic History   Marital status: Married    Spouse name: Not on file   Number of children: Not on file   Years of education: Not on file   Highest education level: Not on file  Occupational History   Not on file  Tobacco Use   Smoking status: Never   Smokeless tobacco: Never  Substance and Sexual Activity   Alcohol use: Not on file   Drug use: Not on file   Sexual activity: Not on file  Other Topics Concern   Not on file  Social History Narrative   Not on file   Social Drivers of Health   Financial Resource Strain: Low Risk  (06/20/2021)   Received from [facility], [facility], [facility], [facility]    Overall Financial Resource Strain (CARDIA)    Difficulty of Paying Living Expenses: Not hard at all  Food Insecurity: No Food Insecurity (06/20/2021)   Received from [facility], [facility]   Hunger Vital Sign    Worried About Running Out of Food in the Last Year: Never true    Ran Out of Food in the Last Year: Never true  Transportation Needs: No Transportation Needs (06/20/2021)   Received from [facility], [facility]   [facility] - Transportation    Lack of Transportation (Medical): No    Lack of Transportation (Non-Medical): No  Physical Activity: Not on file  Stress: Not on file  Social Connections: Not on file   No Known Allergies  Medications   Medications Prior to Admission  Medication Sig Dispense Refill Last Dose/Taking   atorvastatin (LIPITOR) 10 MG tablet TAKE 1 TABLET BY MOUTH DAILY 90 tablet 1    bisacodyl (DULCOLAX) 10 MG suppository Place 1 suppository (10 mg  total) rectally as needed for moderate constipation. 12 suppository 0    diazepam (VALIUM) 2 MG tablet TAKE 1 TABLET BY MOUTH DAILY 90 tablet 1    diclofenac Sodium (VOLTAREN) 1 % GEL Apply 8 g topically 2 (two) times daily. 4 g to each knee twice a day 500 g 2    donepezil (ARICEPT) 5 MG tablet Take 1 tablet (5 mg total) by mouth at bedtime. 90 tablet 0    fluticasone furoate-vilanterol (BREO ELLIPTA) 100-25 MCG/ACT AEPB Inhale 1 puff into the lungs daily.      NIFEdipine (PROCARDIA-XL/NIFEDICAL-XL) 30 MG 24 hr tablet TAKE 1 TABLET BY MOUTH IN THE  MORNING 90 tablet 3    pantoprazole (PROTONIX) 40 MG tablet Take by mouth. (Patient not taking: Reported on 06/01/2022)      polyethylene glycol (MIRALAX) 17 g packet Take 17 g by mouth daily for 14 days. Max 7 days at a time 7 each 1    pramoxine (PROCTOFOAM) 1 % foam Place 1 Application rectally 3 (three) times daily as needed for anal itching. 15 g 0    sulfamethoxazole-trimethoprim (BACTRIM DS) 800-160 MG tablet Take 1 tablet by mouth 2 (two) times daily  for 7 days. 14 tablet 0    tamsulosin (FLOMAX) 0.4 MG CAPS capsule Take by mouth. (Patient not taking: Reported on 06/01/2022)        Vitals   Vitals:   05/11/23 2045 05/11/23 2100 05/11/23 2115 05/11/23 2130  BP: 135/67 136/70 125/87 (!) 140/69  Pulse: 82 81 82 80  Resp: 19 15 17  (!) 26  Temp:      TempSrc:      SpO2: 96% 99% 90% 96%     There is no height or weight on file to calculate BMI.  Physical Exam   Constitutional: Appears well-developed and well-nourished.  Neurologic Examination   Neuro: Mental Status: Patient keeps his eyes closed despite being awake, he is able to tell me his age but gives the month as may No signs of aphasia, but I do suspect he has some neglect Cranial Nerves: II: Left hemianopia. Pupils are equal, round, and reactive to light.   III,IV, VI: He is able to cross midline to the left, but is not able to fully look to that side V: Facial sensation is symmetric to temperature VII: Facial movement is symmetric.  VIII: hearing is intact to voice X: Uvula elevates symmetrically XII: tongue is midline without atrophy or fasciculations.  Motor: Tone is normal. Bulk is normal. 5/5 strength was present in all four extremities. *** Sensory: Sensation is symmetric to light touch and temperature in the arms and legs.*** Deep Tendon Reflexes: 2+ and symmetric in the biceps and patellae. *** Plantars: Toes are downgoing bilaterally. *** Cerebellar: FNF and HKS are intact bilaterally***        Labs   CBC:  Recent Labs  Lab 05/11/23 1344  WBC 6.7  NEUTROABS 3.0  HGB 12.0*  HCT 34.9*  MCV 82.9  PLT 286    Basic Metabolic Panel:  Lab Results  Component Value Date   NA 137 05/11/2023   K 4.8 05/11/2023   CO2 24 05/11/2023   GLUCOSE 86 05/11/2023   BUN 29 (H) 05/11/2023   CREATININE 1.47 (H) 05/11/2023   CALCIUM 9.6 05/11/2023   GFRNONAA 46 (L) 05/11/2023   Lipid Panel:  Lab Results  Component Value Date   LDLCALC 66 02/03/2023    HgbA1c: No results found for: "HGBA1C" Urine Drug Screen: No  results found for: "LABOPIA", "COCAINSCRNUR", "LABBENZ", "AMPHETMU", "THCU", "LABBARB"  Alcohol Level     Component Value Date/Time   ETH <10 05/11/2023 1344   INR  Lab Results  Component Value Date   INR 1.0 05/11/2023   APTT  Lab Results  Component Value Date   APTT 32 05/11/2023     CT Head without contrast(Personally reviewed): ***  CT angio Head and Neck with contrast(Personally reviewed): ***  MR Angio head without contrast and Carotid Duplex BL(Personally reviewed): ***  MRI Brain(Personally reviewed): ***  rEEG:  ***  Impression   BRANSYN ADAMI is a 83 y.o. male ***  Primary Diagnosis:  {ICH:22346}  Secondary Diagnosis: {Stroke Comorbidities:21266}  Recommendations  *** ______________________________________________________________________   Stormy Card, MD [facility]

## 2023-05-12 NOTE — Progress Notes (Signed)
SLP Cancellation Note  Patient Details Name: Derek Yates MRN: [id number] DOB: 1940-11-03   Cancelled treatment:       Reason Eval/Treat Not Completed: Patient at procedure or test/unavailable. SLP will continue following.    Gwynneth Aliment, M.A., CF-SLP Speech Language Pathology, Acute Rehabilitation Services  Secure Chat preferred [phone]  05/12/2023, 9:54 AM

## 2023-05-12 NOTE — Progress Notes (Signed)
Echocardiogram 2D Echocardiogram has been performed.  Cleophas Yoak N Kendrik Mcshan,RDCS 05/12/2023, 10:26 AM

## 2023-05-12 NOTE — Progress Notes (Addendum)
STROKE TEAM PROGRESS NOTE   INTERIM HISTORY/SUBJECTIVE  Family at bedside. Updated on plan of care and assessment.  Wife confirmed that she was taking care of him all the time, memory issues at home, unable to hold typical conversations, refused to use walker at home and needed help from family for ADLs.   On exam, very drowsy, quiet voice, follows commands, no specific focal weakness seen.  3L [place], intermittent cough, family states that he sounds stopped up. Will get a CXR.  Patient remains NPO. IVF added.   Keep in ICU today.  Repeat CT ordered for tomorrow morning   OBJECTIVE  CBC    Component Value Date/Time   WBC 6.7 05/11/2023 1344   RBC 4.21 (L) 05/11/2023 1344   HGB 12.0 (L) 05/11/2023 1344   HGB 13.3 02/03/2023 0852   HCT 34.9 (L) 05/11/2023 1344   HCT 39.9 02/03/2023 0852   PLT 286 05/11/2023 1344   PLT 271 02/03/2023 0852   MCV 82.9 05/11/2023 1344   MCV 87 02/03/2023 0852   MCH 28.5 05/11/2023 1344   MCHC 34.4 05/11/2023 1344   RDW 14.9 05/11/2023 1344   RDW 13.2 02/03/2023 0852   LYMPHSABS 2.8 05/11/2023 1344   LYMPHSABS 2.6 02/03/2023 0852   MONOABS 0.8 05/11/2023 1344   EOSABS 0.1 05/11/2023 1344   EOSABS 0.3 02/03/2023 0852   BASOSABS 0.0 05/11/2023 1344   BASOSABS 0.1 02/03/2023 0852    BMET    Component Value Date/Time   NA 136 05/12/2023 0418   NA 143 02/03/2023 0852   K 4.6 05/12/2023 0418   CL 101 05/12/2023 0418   CO2 23 05/12/2023 0418   GLUCOSE 101 (H) 05/12/2023 0418   BUN 17 05/12/2023 0418   BUN 15 02/03/2023 0852   CREATININE 1.40 (H) 05/12/2023 0418   CALCIUM 9.4 05/12/2023 0418   EGFR 76 02/03/2023 0852   GFRNONAA 49 (L) 05/12/2023 0418    IMAGING past 24 hours MR BRAIN WO CONTRAST Result Date: 05/12/2023 CLINICAL DATA:  Stroke follow-up EXAM: MRI HEAD WITHOUT CONTRAST TECHNIQUE: Multiplanar, multiecho pulse sequences of the brain and surrounding structures were obtained without intravenous contrast. COMPARISON:  Head CT  05/11/2023 FINDINGS: Brain: Intraparenchymal hematoma centered at the right caudate head with intraventricular extension of hemorrhage and mild communicating hydrocephalus. There is subarachnoid blood within a single left parietal sulcus. Numerous chronic microhemorrhages in a predominantly peripheral distribution. There is multifocal hyperintense T2-weighted signal within the white matter. Generalized volume loss. Old left frontal, left occipital and right parietooccipital infarcts. The midline structures are normal. Vascular: Normal flow voids. Skull and upper cervical spine: Normal calvarium and skull base. Visualized upper cervical spine and soft tissues are normal. Sinuses/Orbits:No paranasal sinus fluid levels or advanced mucosal thickening. No mastoid or middle ear effusion. Normal orbits. IMPRESSION: 1. Intraparenchymal hematoma centered at the right caudate head with intraventricular extension of hemorrhage and mild communicating hydrocephalus. 2. Subarachnoid blood within a single left parietal sulcus. 3. 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 4. Old left frontal, left occipital and right parietooccipital infarcts. Electronically Signed   By: Deatra Robinson M.D.   On: 05/12/2023 01:46   CT HEAD WO CONTRAST ( ) Result Date: 05/11/2023 CLINICAL DATA:  Follow-up hemorrhagic stroke EXAM: CT HEAD WITHOUT CONTRAST TECHNIQUE: Contiguous axial images were obtained from the base of the skull through the vertex without intravenous contrast. RADIATION DOSE REDUCTION: This exam was performed according to the departmental dose-optimization program  which includes automated exposure control, adjustment of the mA and/or kV according to patient size and/or use of iterative reconstruction technique. COMPARISON:  CT 7 hours prior. FINDINGS: Brain: Intraparenchymal hemorrhage in the right basal ganglia is approximately unchanged in  size measured at 28 x 20 mm compared with 26 x 22 mm previously. The plane of the scan is slightly different. Intraventricular penetration with the amount of intraventricular blood being similar. Therefore, this repeat study does not show strong evidence continuing bleeding. Elsewhere, chronic small-vessel ischemic changes affect pons. Old cortical infarction in the right parieto-occipital region. Extensive chronic small-vessel ischemic changes throughout the hemispheric white matter. No subarachnoid or subdural blood is seen. Ventricular size remains stable. Vascular: There is atherosclerotic calcification of the major vessels at the base of the brain. Skull: Negative Sinuses/Orbits: Clear/normal Other: None IMPRESSION: 1. No significant change in the size of the intraparenchymal hemorrhage in the right basal ganglia. Intraventricular penetration with the amount of intraventricular blood being similar. Therefore, this repeat study does not show evidence of continuing bleeding. 2. Old cortical infarction in the right parieto-occipital region. Extensive chronic small-vessel ischemic changes throughout the hemispheric white matter. Electronically Signed   By: Paulina Fusi M.D.   On: 05/11/2023 21:25   CT HEAD CODE STROKE WO CONTRAST Result Date: 05/11/2023 CLINICAL DATA:  Code stroke. Neuro deficit, acute, stroke suspected. Aphasia and ataxia. EXAM: CT HEAD WITHOUT CONTRAST TECHNIQUE: Contiguous axial images were obtained from the base of the skull through the vertex without intravenous contrast. RADIATION DOSE REDUCTION: This exam was performed according to the departmental dose-optimization program which includes automated exposure control, adjustment of the mA and/or kV according to patient size and/or use of iterative reconstruction technique. COMPARISON:  None Available. FINDINGS: Brain: Acute intraparenchymal hematoma centered within the right caudate nucleus, measuring up to 2.6 x 2.3 x 1.0 cm (3 mL) with  intraventricular extension casting the ventricles. Mild mass effect on the surrounding structures. No midline shift. Basilar cisterns are patent. Old cortical infarcts in the posterior aspect of the right ACA territory, including the medial aspect of the posterior right frontal lobe and right parietal lobe. Old cortical infarct in the left occipital lobe. Encephalomalacia in the anterior left frontal lobe may represent sequela of prior trauma or infarct. Background of moderate chronic small-vessel disease. Vascular: No hyperdense vessel or unexpected calcification. Skull: No calvarial fracture or suspicious bone lesion. Skull base is unremarkable. Sinuses/Orbits: No acute finding. Other: None. IMPRESSION: 1. Acute intraparenchymal hematoma centered within the right caudate nucleus, measuring up to 2.6 x 2.3 x 1.0 cm (3 mL) with intraventricular extension casting the ventricles. Mild mass effect on the surrounding structures. No midline shift. 2. Old cortical infarcts in the posterior aspect of the right ACA territory, including the medial aspect of the posterior right frontal lobe and right parietal lobe. Old cortical infarct in the left occipital lobe. Encephalomalacia in the anterior left frontal lobe may represent sequela of prior trauma or infarct. Critical Value/emergent results were called by telephone at the time of interpretation on 05/11/2023 at 2:00 p.m. to provider NEHA RAY , who verbally acknowledged these results. Electronically Signed   By: Orvan Falconer M.D.   On: 05/11/2023 14:05    Vitals:   05/12/23 0600 05/12/23 0609 05/12/23 0700 05/12/23 0800  BP: (!) 142/73 (!) 142/73 (!) 144/74   Pulse: 75 84 78   Resp: (!) 23 14 (!) 24   Temp:    98.3 F (36.8 C)  TempSrc:  Oral  SpO2: 98% 96% 99%   Weight:         PHYSICAL EXAM General:  Alert, well-nourished, well-developed patient in no acute distress Psych:  Mood and affect appropriate for situation CV: Regular rate and rhythm on  monitor Respiratory:  Regular, unlabored respirations on room air GI: Abdomen soft and nontender  NEURO:  Mental Status: Drowsy, increased from yesterday. oriented to self, age, place.  Poor attention.  Follows commands Speech/Language: Quiet voice.  Speech is without dysarthria or aphasia.  Naming, repetition, fluency, and comprehension intact.  Cranial Nerves:  II: PERRL.  Intermittent blink to threat on left III, IV, VI: EOMI. Eyelids elevate symmetrically.  V: Sensation is intact to light touch and symmetrical to face.  VII: Left facial droop VIII: hearing intact to voice. IX, X: Palate elevates symmetrically. Phonation is normal.  ZO:[id number] shrug 5/5. XII: tongue is midline without fasciculations. Quiet voice.  Motor: Generalized weakness but no focal deficits seen Tone: is normal and bulk is normal Sensation- Intact to light touch bilaterally. Extinction absent to light touch to DSS.   Coordination: FTN intact bilaterally, HKS: no ataxia in BLE.No drift.  Gait- deferred  Most Recent NIH: 5 on exam.      ASSESSMENT/PLAN  Mr. JAELAN RASHEED is a 83 y.o. male with history of hypertension, CVA, recurrent UTIs, dementia who presented to [facility] as a code stroke due to speech deficits, left-sided deficits.  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 Intraventricular penetration with the amount of intraventricular blood being similar. CTA not pursued as it will not change mangement MRI ICH centered at the right caudate head with IVH and mild communicating hydrocephalus. Numerous chronic  microhemorrhages in a predominantly peripheral distribution, which may be due to cerebral amyloid angiopathy. Old left frontal, left occipital and right parieto-occipital infarcts. CT repeat pending in a.m. 2D Echo: LVEF 65 to 70%  LDL pending HgbA1c pending VTE prophylaxis - SCDs No antithrombotic prior to admission, now on No antithrombotic due to ICH and CAA Therapy recommendations:  Pending Disposition pending  Advanced CAA MRI showed advanced CAA No antithrombotics Strict BP control less than 140 Recommend no statin at this time  Hypertension Home meds:  nifedipine 30mg  Stable BP goal less than 140 given CAA Strict BP control at home  Hyperlipidemia Home meds:  lipitor 10mg  LDL 66, goal < 70 Recommend no statin given advanced CAA  Dysphagia Patient has post-stroke dysphagia SLP consulted Now n.p.o. 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 Note/plan to be edited by MD as needed.    Lynnae January, DNP, AGACNP-BC [facility] Please use AMION for contact  information & EPIC for messaging.  ATTENDING NOTE: I reviewed above note and agree with the assessment and plan. Pt was seen and examined.   Wife and close friend are at the bedside. I also talked with daughter in law over the phone. Pt is lethargic but open eyes on voice, orientated to age, place but not to time. No aphasia but moderate to severe dysarthria, spoke in words not sentences, but following all simple commands. Able  to name 2/2 and repeat is dysarthric voice with some paraphasic errors. No gaze palsy, tracking bilaterally, consistently blinking to visual threat on the right but inconsistent on the left, PERRL. Mild left facial droop. Tongue midline. BUEs and BLEs 3/5 bilaterally seems symmetrical but difficult testing due to pt positioning. Sensation, coordination not quite cooperative and gait not tested.  Patient bleeding location suggesting hypertensive bleed, however patient BP well-controlled without needing BP meds.  MRI showed advanced CAA, patient bleeding likely still due to CAA.  Strict BP control, no antithrombotics or statin.  AKI with creatinine 1.40, continue IV fluid.  Currently n.p.o. did not pass swallow, will consider tube feeding tomorrow if needed.  For detailed assessment and plan, please refer to above/below as I have made changes wherever appropriate.   Marvel Plan, MD PhD Stroke Neurology 05/12/2023 5:08 PM  This patient is critically ill due to ICH and IVH, advanced CAA, dysphagia and at significant risk of neurological worsening, death form hematoma expansion, cerebral edema, recurrent ICH, aspiration. This patient's care requires constant monitoring of vital signs, hemodynamics, respiratory and cardiac monitoring, review of multiple databases, neurological assessment, discussion with family, other specialists and medical decision making of high complexity. I spent 45 minutes of neurocritical care time in the care of this patient. I had long discussion with wife at bedside and daughter-in-law over the phone, updated pt current condition, treatment plan and potential prognosis, and answered all the questions.  They expressed understanding and appreciation.      To contact Stroke Continuity provider, please refer to [email or web address]. After hours, contact General Neurology

## 2023-05-12 NOTE — Progress Notes (Signed)
PT Cancellation Note  Patient Details Name: Derek Yates MRN: [id number] DOB: 19-Oct-1940   Cancelled Treatment:    Reason Eval/Treat Not Completed: Patient not medically ready - PT order starts today at 20:19, will check back for eval tomorrow.   Marye Round, PT DPT Acute Rehabilitation Services Secure Chat Preferred  Office [phone]    Truddie Coco 05/12/2023, 8:42 AM

## 2023-05-12 NOTE — Progress Notes (Signed)
OT Cancellation Note  Patient Details Name: WINIFRED BALOGH MRN: [id number] DOB: [birth year]   Cancelled Treatment:    Reason Eval/Treat Not Completed: Active bedrest order;Patient not medically ready (Will assess when activity orders updated.)  [facility] 05/12/2023, 9:55 AM Luisa Dago, OT/L   Acute OT Clinical Specialist Acute Rehabilitation Services Pager [phone] Office [phone]

## 2023-05-12 NOTE — Plan of Care (Signed)
  Problem: Clinical Measurements: Goal: Ability to maintain clinical measurements within normal limits will improve Outcome: Progressing Goal: Cardiovascular complication will be avoided Outcome: Progressing   Problem: Coping: Goal: Level of anxiety will decrease Outcome: Progressing   Problem: Elimination: Goal: Will not experience complications related to urinary retention Outcome: Progressing   Problem: Pain Managment: Goal: General experience of comfort will improve and/or be controlled Outcome: Progressing   Problem: Intracerebral Hemorrhage Tissue Perfusion: Goal: Complications of Intracerebral Hemorrhage will be minimized Outcome: Progressing   Problem: Coping: Goal: Will identify appropriate support needs Outcome: Progressing   Problem: Health Behavior/Discharge Planning: Goal: Goals will be collaboratively established with patient/family Outcome: Progressing

## 2023-05-13 ENCOUNTER — Inpatient Hospital Stay (HOSPITAL_COMMUNITY): Payer: Medicare Other

## 2023-05-13 DIAGNOSIS — E43 Unspecified severe protein-calorie malnutrition: Secondary | ICD-10-CM | POA: Insufficient documentation

## 2023-05-13 DIAGNOSIS — I680 Cerebral amyloid angiopathy: Secondary | ICD-10-CM | POA: Diagnosis not present

## 2023-05-13 DIAGNOSIS — I610 Nontraumatic intracerebral hemorrhage in hemisphere, subcortical: Secondary | ICD-10-CM | POA: Diagnosis not present

## 2023-05-13 LAB — CBC
HCT: 35.4 % — ABNORMAL LOW (ref 39.0–52.0)
Hemoglobin: 12.2 g/dL — ABNORMAL LOW (ref 13.0–17.0)
MCH: 28.1 pg (ref 26.0–34.0)
MCHC: 34.5 g/dL (ref 30.0–36.0)
MCV: 81.6 fL (ref 80.0–100.0)
Platelets: 256 10*3/uL (ref 150–400)
RBC: 4.34 MIL/uL (ref 4.22–5.81)
RDW: 14.6 % (ref 11.5–15.5)
WBC: 11 10*3/uL — ABNORMAL HIGH (ref 4.0–10.5)
nRBC: 0 % (ref 0.0–0.2)

## 2023-05-13 LAB — BASIC METABOLIC PANEL
Anion gap: 13 (ref 5–15)
BUN: 21 mg/dL (ref 8–23)
CO2: 22 mmol/L (ref 22–32)
Calcium: 9.2 mg/dL (ref 8.9–10.3)
Chloride: 101 mmol/L (ref 98–111)
Creatinine, Ser: 1.24 mg/dL (ref 0.61–1.24)
GFR, Estimated: 57 mL/min — ABNORMAL LOW (ref >60)
Glucose, Bld: 98 mg/dL (ref 70–99)
Potassium: 4.1 mmol/L (ref 3.5–5.1)
Sodium: 136 mmol/L (ref 135–145)

## 2023-05-13 LAB — LIPID PANEL
Cholesterol: 134 mg/dL (ref 0–200)
HDL: 50 mg/dL (ref >40)
LDL Cholesterol: 69 mg/dL (ref 0–99)
Total CHOL/HDL Ratio: 2.7 {ratio}
Triglycerides: 77 mg/dL (ref <150)
VLDL: 15 mg/dL (ref 0–40)

## 2023-05-13 LAB — PHOSPHORUS
Phosphorus: 3.3 mg/dL (ref 2.5–4.6)
Phosphorus: 2.9 mg/dL (ref 2.5–4.6)

## 2023-05-13 LAB — GLUCOSE, CAPILLARY
Glucose-Capillary: 142 mg/dL — ABNORMAL HIGH (ref 70–99)
Glucose-Capillary: 146 mg/dL — ABNORMAL HIGH (ref 70–99)
Glucose-Capillary: 161 mg/dL — ABNORMAL HIGH (ref 70–99)

## 2023-05-13 LAB — HEMOGLOBIN A1C
Hgb A1c MFr Bld: 5.7 % — ABNORMAL HIGH (ref 4.8–5.6)
Mean Plasma Glucose: 116.89 mg/dL

## 2023-05-13 LAB — MAGNESIUM
Magnesium: 2.4 mg/dL (ref 1.7–2.4)
Magnesium: 2.3 mg/dL (ref 1.7–2.4)

## 2023-05-13 MED ORDER — THIAMINE MONONITRATE 100 MG PO TABS
100.0000 mg | ORAL_TABLET | Freq: Every day | ORAL | Status: AC
  Administered 2023-05-13 – 2023-05-19 (×7): 100 mg
  Filled 2023-05-13 (×7): qty 1

## 2023-05-13 MED ORDER — OSMOLITE 1.5 CAL PO LIQD
1000.0000 mL | ORAL | Status: DC
  Administered 2023-05-13 – 2023-06-06 (×19): 1000 mL
  Filled 2023-05-13: qty 1000

## 2023-05-13 MED ORDER — AMLODIPINE BESYLATE 10 MG PO TABS
10.0000 mg | ORAL_TABLET | Freq: Every day | ORAL | Status: DC
  Administered 2023-05-13 – 2023-06-23 (×39): 10 mg
  Filled 2023-05-13 (×9): qty 1
  Filled 2023-05-13 (×2): qty 2
  Filled 2023-05-13 (×4): qty 1
  Filled 2023-05-13: qty 2
  Filled 2023-05-13 (×4): qty 1
  Filled 2023-05-13: qty 2
  Filled 2023-05-13 (×6): qty 1
  Filled 2023-05-13: qty 2
  Filled 2023-05-13: qty 1
  Filled 2023-05-13 (×2): qty 2
  Filled 2023-05-13 (×7): qty 1
  Filled 2023-05-13: qty 2
  Filled 2023-05-13: qty 1
  Filled 2023-05-13: qty 2

## 2023-05-13 MED ORDER — PROSOURCE TF20 ENFIT COMPATIBL EN LIQD
60.0000 mL | Freq: Two times a day (BID) | ENTERAL | Status: DC
  Administered 2023-05-13 – 2023-06-10 (×56): 60 mL
  Filled 2023-05-13 (×56): qty 60

## 2023-05-13 MED ORDER — ADULT MULTIVITAMIN W/MINERALS CH
1.0000 | ORAL_TABLET | Freq: Every day | ORAL | Status: DC
  Administered 2023-05-13 – 2023-06-23 (×41): 1
  Filled 2023-05-13 (×42): qty 1

## 2023-05-13 MED ORDER — AMLODIPINE BESYLATE 10 MG PO TABS: 10.0000 mg | ORAL_TABLET | Freq: Every day | ORAL | Status: DC

## 2023-05-13 MED ORDER — LABETALOL HCL 5 MG/ML IV SOLN
10.0000 mg | INTRAVENOUS | Status: DC | PRN
  Administered 2023-05-13 – 2023-05-16 (×6): 10 mg via INTRAVENOUS
  Filled 2023-05-13 (×5): qty 4

## 2023-05-13 NOTE — TOC CM/SW Note (Signed)
Transition of Care [facility]) - Inpatient Brief Assessment   Patient Details  Name: TYRI ELMORE MRN: [id number] Date of Birth: Mar 30, 1940  Transition of [facility]) CM/SW Contact:    Mearl Latin, LCSW Phone Number: 05/13/2023, 5:13 PM   Clinical Narrative: Patient admitted from home with spouse. TOC following for rehab needs. Currently with [facility].    Transition of Care Asessment: Insurance and Status: Insurance coverage has been reviewed Patient has primary care physician: Yes Home environment has been reviewed: From home Prior level of function:: Independent Prior/Current Home Services: No current home services Social Drivers of Health Review: SDOH reviewed no interventions necessary Readmission risk has been reviewed: Yes Transition of care needs: transition of care needs identified, TOC will continue to follow

## 2023-05-13 NOTE — Evaluation (Signed)
Occupational Therapy Evaluation Patient Details Name: Derek Yates MRN: [id number] DOB: [birth year] Today's Date: 05/13/2023   History of Present Illness   83 yo male presents to [facility] on 2/19 as code stroke for speech deficits, L weakness. 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     Clinical Impressions Patient admitted for the diagnosis above.  PTA he lives at home with his spouse.  The spouse has been assisting him over the last two weeks as he recovers from a UTI.  Son expresses more weakness than baseline over the past two weeks.  Prior to that, the patient was Independent with all ADL,iADL and mobility, recently flying internationally.  Currently with decreased LOA it is difficult to get a true picture of his abilities.  +2 with all mobility and total A for ADL completion.  It is presumed that as LOA improves, he will progress with mobility and ADL completion.  OT will follow in the acute setting and Patient will benefit from intensive inpatient follow-up therapy, >3 hours/day      If plan is discharge home, recommend the following:   Assist for transportation;A lot of help with bathing/dressing/bathroom;A lot of help with walking and/or transfers     Functional Status Assessment   Patient has had a recent decline in their functional status and demonstrates the ability to make significant improvements in function in a reasonable and predictable amount of time.     Equipment Recommendations   Tub/shower seat     Recommendations for Other Services         Precautions/Restrictions   Precautions Precautions: Fall Recall of Precautions/Restrictions: Impaired Precaution/Restrictions Comments: Rectal tube, condom cath Restrictions Weight Bearing Restrictions Per Provider Order: No     Mobility Bed Mobility Overal bed mobility: Needs Assistance Bed Mobility: Supine to Sit, Sit to  Supine     Supine to sit: Max assist, +2 for physical assistance Sit to supine: Max assist, +2 for physical assistance        Transfers Overall transfer level: Needs assistance Equipment used: 2 person hand held assist Transfers: Sit to/from Stand Sit to Stand: Total assist, +2 physical assistance                  Balance Overall balance assessment: Needs assistance Sitting-balance support: Bilateral upper extremity supported, Feet supported Sitting balance-Leahy Scale: Poor Sitting balance - Comments: heavy L lateral bias, difficult to correct Postural control: Left lateral lean Standing balance support: Bilateral upper extremity supported, During functional activity Standing balance-Leahy Scale: Zero                             ADL either performed or assessed with clinical judgement   ADL Overall ADL's : Needs assistance/impaired Eating/Feeding: NPO   Grooming: Total assistance;Bed level   Upper Body Bathing: Total assistance;Bed level   Lower Body Bathing: Total assistance;Bed level   Upper Body Dressing : Total assistance;Bed level   Lower Body Dressing: Total assistance;Bed level   Toilet Transfer: Total assistance   Toileting- Clothing Manipulation and Hygiene: Total assistance               Vision   Vision Assessment?: Vision impaired- to be further tested in functional context Additional Comments: eyes closed during majority of evaluation     Perception Perception: Not tested       Praxis Praxis: Not tested  Pertinent Vitals/Pain Pain Assessment Pain Assessment: Faces Faces Pain Scale: Hurts a little bit Pain Location: generalized Pain Descriptors / Indicators: Grimacing, Discomfort Pain Intervention(s): Monitored during session     Extremity/Trunk Assessment Upper Extremity Assessment Upper Extremity Assessment: Right hand dominant;RUE deficits/detail;LUE deficits/detail RUE Deficits / Details: No pruposeful  movements to command, but actively resists bed mobility.  Continue to assess. LUE Deficits / Details: No pruposeful movements to command, but actively resists bed mobility   Lower Extremity Assessment Lower Extremity Assessment: Defer to PT evaluation LLE Deficits / Details: tight hamstrings, lacking 10 deg knee extension. strength assessment limited by level of arousal, but suspect weakness LLE as pt with minimal volitional movemnet LLE   Cervical / Trunk Assessment Cervical / Trunk Assessment: Kyphotic   Communication Communication Communication: Impaired Factors Affecting Communication: Difficulty expressing self;Reduced clarity of speech   Cognition Arousal: Lethargic Behavior During Therapy: Flat affect Cognition: Difficult to assess Difficult to assess due to: Level of arousal                             Following commands: Impaired Following commands impaired: Follows one step commands with increased time, Follows one step commands inconsistently     Cueing  General Comments   Cueing Techniques: Gestural cues;Verbal cues      Exercises     Shoulder Instructions      Home Living Family/patient expects to be discharged to:: Private residence Living Arrangements: Spouse/significant other Available Help at Discharge: Family;Available 24 hours/day Type of Home: House Home Access: Stairs to enter [employer] of Steps: 2 Entrance Stairs-Rails: None Home Layout: Able to live on main level with bedroom/bathroom     Bathroom Shower/Tub: Producer, television/film/video: Standard Bathroom Accessibility: Yes How Accessible: Accessible via walker Home Equipment: Rolling Walker (2 wheels);Cane - single point;Shower seat          Prior Functioning/Environment Prior Level of Function : Needs assist             Mobility Comments: wife helping with walking since trip overseas x2 weeks ago, has been weak since this trip and getting UTI ADLs  Comments: pt's wife has been assisting with all ADLs since getting UTI and recent trip . Prior to this pt was indep    OT Problem List: Decreased strength;Decreased activity tolerance;Impaired balance (sitting and/or standing);Decreased knowledge of use of DME or AE;Decreased safety awareness;Decreased cognition;Pain   OT Treatment/Interventions: Self-care/ADL training;Therapeutic activities;Patient/family education;DME and/or AE instruction;Balance training      OT Goals(Current goals can be found in the care plan section)   Acute Rehab OT Goals OT Goal Formulation: Patient unable to participate in goal setting Time For Goal Achievement: 05/27/23 Potential to Achieve Goals: Fair ADL Goals Pt Will Perform Grooming: with min assist;sitting Pt Will Perform Upper Body Bathing: with mod assist;sitting Pt Will Perform Upper Body Dressing: with mod assist;sitting Pt Will Transfer to Toilet: with min assist;with +2 assist;bedside commode Pt/caregiver will Perform Home Exercise Program: Increased strength;Both right and left upper extremity;With minimal assist Additional ADL Goal #1: Patient will sit EOB with supervision for up to 10 min to increase independence with seated ADL and grooming.   OT Frequency:  Min 1X/week    Co-evaluation PT/OT/SLP Co-Evaluation/Treatment: Yes Reason for Co-Treatment: Necessary to address cognition/behavior during functional activity;For patient/therapist safety;Complexity of the patient's impairments (multi-system involvement) PT goals addressed during session: Mobility/safety with mobility;Balance OT goals addressed during session: ADL's and  self-care      AM-PAC OT "6 Clicks" Daily Activity     Outcome Measure Help from another person eating meals?: Total Help from another person taking care of personal grooming?: Total Help from another person toileting, which includes using toliet, bedpan, or urinal?: Total Help from another person bathing (including  washing, rinsing, drying)?: Total Help from another person to put on and taking off regular upper body clothing?: Total Help from another person to put on and taking off regular lower body clothing?: Total 6 Click Score: 6   End of Session Equipment Utilized During Treatment: Gait belt Nurse Communication: Mobility status  Activity Tolerance: Patient limited by lethargy Patient left: in bed;with call bell/phone within reach;with bed alarm set;with family/visitor present  OT Visit Diagnosis: Unsteadiness on feet (R26.81);Other symptoms and signs involving cognitive function                Time: 1000-1025 OT Time Calculation (min): 25 min Charges:  OT General Charges $OT Visit: 1 Visit OT Evaluation $OT Eval Moderate Complexity: 1 Mod  05/13/2023  RP, OTR/L  Acute Rehabilitation Services  Office:  [phone]   Suzanna Obey 05/13/2023, 11:12 AM

## 2023-05-13 NOTE — Progress Notes (Signed)
Initial Nutrition Assessment  DOCUMENTATION CODES:   Severe malnutrition in context of chronic illness  INTERVENTION:   Initiate tube feeding via Cortrak tube: Osmolite 1.5 at 20 ml/h and increase by 10 ml every 8 hours to goal rate of 40 ml/hr (960 ml per day)  Prosource TF20 60 ml BID  Provides 1600 kcal, 100 gm protein, 729 ml free water daily  100 mg thiamine daily via tube x 7 days  MVI with minerals daily via tube   Monitor magnesium and phosphorus every 12 hours x 4 occurrences, MD to replete as needed, as pt is at risk for refeeding syndrome given pt meets criteria for severe malnutrition.   NUTRITION DIAGNOSIS:   Severe Malnutrition related to chronic illness as evidenced by severe fat depletion, severe muscle depletion.  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   Pt discussed during ICU rounds and with RN and MD. Pt seen after cortrak placement. Says hello but unable to answer any questions. No family present at this time.    2/21 - s/p cortrak placement; tip gastric  Medications reviewed and include: protonix, senokot-s NS @ 75 ml/hr  Labs reviewed:  A1C: 5.7    NUTRITION - FOCUSED PHYSICAL EXAM:  Flowsheet Row Most Recent Value  Orbital Region Severe depletion  Upper Arm Region Severe depletion  Thoracic and Lumbar Region Severe depletion  Buccal Region Severe depletion  Temple Region Severe depletion  Clavicle Bone Region Severe depletion  Clavicle and Acromion Bone Region Severe depletion  Scapular Bone Region Unable to assess  Dorsal Hand Severe depletion  Patellar Region Severe depletion  Anterior Thigh Region Severe depletion  Posterior Calf  Region Severe depletion  Edema (RD Assessment) None  Hair Reviewed  Eyes Unable to assess  Mouth Reviewed  Skin Reviewed  Nails Reviewed       Diet Order:   Diet Order             Diet NPO time specified  Diet effective now                   EDUCATION NEEDS:   Not appropriate for education at this time  Skin:  Skin Assessment: Reviewed RN Assessment  Last BM:  2/21 x 2 large, type 7  Height:   Ht Readings from Last 1 Encounters:  05/11/23 5\' 8"  (1.727 m)    Weight:   Wt Readings from Last 1 Encounters:  05/11/23 61.9 kg    BMI:  Body mass index is 20.75 kg/m.  Estimated Nutritional Needs:   Kcal:  1600-1800  Protein:  90-100 grams  Fluid:  >1.6 L/day  Cammy Copa., RD, LDN, CNSC See AMiON for contact information

## 2023-05-13 NOTE — Progress Notes (Signed)
SLP Cancellation Note  Patient Details Name: Derek Yates MRN: [id number] DOB: 10/06/1940   Cancelled treatment:       Reason Eval/Treat Not Completed: Fatigue/lethargy limiting ability to participate. Per conversation with RN, will f/u another time.     [name]., M.A. CCC-SLP Acute Rehabilitation Services Office [phone]  Secure chat preferred  05/13/2023, 10:32 AM

## 2023-05-13 NOTE — Plan of Care (Signed)
  Problem: Clinical Measurements: Goal: Will remain free from infection Outcome: Progressing Goal: Diagnostic test results will improve Outcome: Progressing Goal: Respiratory complications will improve Outcome: Progressing Goal: Cardiovascular complication will be avoided Outcome: Progressing   Problem: Activity: Goal: Risk for activity intolerance will decrease Outcome: Not Progressing   Problem: Nutrition: Goal: Adequate nutrition will be maintained Outcome: Progressing   Problem: Coping: Goal: Level of anxiety will decrease Outcome: Progressing   Problem: Elimination: Goal: Will not experience complications related to bowel motility Outcome: Not Progressing Goal: Will not experience complications related to urinary retention Outcome: Not Progressing

## 2023-05-13 NOTE — Progress Notes (Addendum)
STROKE TEAM PROGRESS NOTE   INTERIM HISTORY/SUBJECTIVE  Family at the bedside. On exam he is very drowsy and hypophonic speech, he follows commands but does not answer orientation questions correctly.  He has a right gaze preference can cross midline but not all the way to a left gaze.  Inconsistent blink to threat, left arm weakness, he does not lift his legs off the bed, can wiggle toes  Will need a core Trak tube placed today and start tube feeds.  Once tube and placed can begin oral BP meds.  BP goal less than 140 Will also repeat CT head in the morning  WBC is 11, afebrile  OBJECTIVE  CBC    Component Value Date/Time   WBC 11.0 (H) 05/13/2023 0501   RBC 4.34 05/13/2023 0501   HGB 12.2 (L) 05/13/2023 0501   HGB 13.3 02/03/2023 0852   HCT 35.4 (L) 05/13/2023 0501   HCT 39.9 02/03/2023 0852   PLT 256 05/13/2023 0501   PLT 271 02/03/2023 0852   MCV 81.6 05/13/2023 0501   MCV 87 02/03/2023 0852   MCH 28.1 05/13/2023 0501   MCHC 34.5 05/13/2023 0501   RDW 14.6 05/13/2023 0501   RDW 13.2 02/03/2023 0852   LYMPHSABS 2.8 05/11/2023 1344   LYMPHSABS 2.6 02/03/2023 0852   MONOABS 0.8 05/11/2023 1344   EOSABS 0.1 05/11/2023 1344   EOSABS 0.3 02/03/2023 0852   BASOSABS 0.0 05/11/2023 1344   BASOSABS 0.1 02/03/2023 0852    BMET    Component Value Date/Time   NA 136 05/13/2023 0501   NA 143 02/03/2023 0852   K 4.1 05/13/2023 0501   CL 101 05/13/2023 0501   CO2 22 05/13/2023 0501   GLUCOSE 98 05/13/2023 0501   BUN 21 05/13/2023 0501   BUN 15 02/03/2023 0852   CREATININE 1.24 05/13/2023 0501   CALCIUM 9.2 05/13/2023 0501   EGFR 76 02/03/2023 0852   GFRNONAA 57 (L) 05/13/2023 0501    IMAGING past 24 hours CT HEAD WO CONTRAST ( ) Result Date: 05/13/2023 CLINICAL DATA:  83 year old male code stroke presentation on 05/11/2023 with right basal ganglia hemorrhage. Subsequent encounter. EXAM: CT HEAD WITHOUT CONTRAST TECHNIQUE: Contiguous axial images were obtained from the  base of the skull through the vertex without intravenous contrast. RADIATION DOSE REDUCTION: This exam was performed according to the departmental dose-optimization program which includes automated exposure control, adjustment of the mA and/or kV according to patient size and/or use of iterative reconstruction technique. COMPARISON:  Brain MRI 05/12/2023.  Head CT 05/11/2023. FINDINGS: Brain: Hyperdense hemorrhage centered at the right caudate nucleus is 30 x 29 x 14 mm (AP by transverse by CC) for an estimated volume of 6 mL, increased from approximately 4 mL on 05/11/2023. Stable moderate volume of intraventricular blood. No acute ventriculomegaly. Mild leftward midline shift of 3 mm is stable from the previous MRI. Basilar cisterns remain patent. Up No new intracranial hemorrhage identified. Stable gray-white matter differentiation throughout the brain. Multifocal chronic encephalomalacia most pronounced in the posterior right MCA territory, left inferior frontal gyrus. Vascular: Extensive calcified atherosclerosis at the skull base. No suspicious intracranial vascular hyperdensity. Skull: Intact, stable. Sinuses/Orbits: Stable paranasal sinus aeration. Other: Stable orbit and scalp soft tissues. IMPRESSION: 1. Roughly 6 mL Right basal ganglia hemorrhage has increased from approximately 4 mL at presentation. Stable moderate volume of IVH. No acute ventriculomegaly. 2. Mild leftward midline shift of 3 mm is stable. 3. No new intracranial abnormality. Multifocal chronic encephalomalacia. Electronically Signed   By:  Odessa Fleming M.D.   On: 05/13/2023 06:45    Vitals:   05/13/23 0900 05/13/23 1000 05/13/23 1100 05/13/23 1155  BP: 136/74 (!) 143/74 131/68   Pulse: 74 81 90   Resp: (!) 23 (!) 27 (!) 25   Temp:    99.8 F (37.7 C)  TempSrc:    Axillary  SpO2: 93% (!) 87% 90%   Weight:         PHYSICAL EXAM General:  Alert, well-nourished, well-developed patient in no acute distress Psych:  Mood and affect  appropriate for situation CV: Regular rate and rhythm on monitor Respiratory:  Regular, unlabored respirations on room air GI: Abdomen soft and nontender  NEURO:  Mental Status: Drowsy, increased from yesterday.  Not oriented today poor attention.  Follows commands Speech/Language: Hypophonic speech speech is without dysarthria or aphasia.  Naming, repetition, fluency, and comprehension intact.  Cranial Nerves:  II: PERRL.  Intermittent blink to threat on left III, IV, VI: EOMI. Eyelids elevate symmetrically.  V: Sensation is intact to light touch and symmetrical to face.  VII: Left facial droop VIII: hearing intact to voice. IX, X: Palate elevates symmetrically. Phonation is normal.  ZO:[id number] shrug 5/5. XII: tongue is midline without fasciculations. Quiet voice.  Motor: Left arm weakness 3/5, bilateral lower extremities not antigravity, can wiggle toes Tone: is normal and bulk is normal Sensation- Intact to light touch bilaterally. Extinction absent to light touch to DSS.   Coordination: FTN intact bilaterally, HKS: no ataxia in BLE.No drift.  Gait- deferred  Most Recent NIH: 20on exam.      ASSESSMENT/PLAN  [name] is a 83 y.o. male with history of hypertension, CVA, recurrent UTIs, dementia who presented to [facility] as a code stroke due to speech deficits, left-sided deficits.  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 Intraventricular penetration with the amount of intraventricular blood being similar. CTA not pursued as it will not change mangement MRI ICH centered at the right  caudate head with IVH and mild communicating hydrocephalus. 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 Old left frontal, left occipital and right parieto-occipital infarcts. CT repeat  2/21: Roughly 6 mL Right basal ganglia hemorrhage has increased from approximately 4 mL at presentation. Stable moderate volume of IVH. No acute ventriculomegaly.  Mild leftward shift of 3 mm stable CT head repeat in the morning 2D Echo: LVEF 65 to 70%  LDL 69 HgbA1c 5.7 VTE prophylaxis - SCDs No antithrombotic prior to admission, now on No antithrombotic due to ICH and CAA Therapy recommendations: CIR Disposition pending  Advanced CAA MRI showed advanced CAA No antithrombotics Strict BP control less than 140 Recommend no statin at this time  Hypertension Home meds:  nifedipine 30mg  Stable BP goal less than 140 given CAA On amlodipine 10 Strict BP control at home  Hyperlipidemia Home meds:  lipitor 10mg  LDL 66, goal < 70 Recommend no statin given advanced CAA  Dysphagia Patient has post-stroke dysphagia SLP consulted Now n.p.o. Core track placed On tube feeds  Dementia Severe memory loss at baseline Able to walk  with assistance at home Home meds including Aricept  Other Stroke Risk Factors Advanced Age  Other Active Problems Recurrent UTIs, now on Rocephin last day of therapy Leukocytosis-WBC 11.0, afebrile-will monitor AKI, Cr 1.40-1.24 Intermittent cough, Supplemental Oxygen needed CXR:  Mild pulmonary vascular congestion No evidence of pneumonia Continue 3L [place], wean as able  Hospital day # 2   Pt seen by Neuro NP/APP and later by MD. Note/plan to be edited by MD as needed.     Gevena Mart DNP, ACNPC-AG  [facility]   ATTENDING NOTE: I reviewed above note and agree with the assessment and plan. Pt was seen and examined.   Son and wife are at bedside.  Patient lying bed,  still lethargic, seemed more lethargic than yesterday, open eyes on voice, however member was orientation questions, more  dysarthria than yesterday.  Still has left facial droop and left upper and lower extremity withdraw less on pain stimulation.  CT repeat showed slight increase of hematoma, will repeat CT in the a.m. again.  Strict BP control less than 140, put on amlodipine 10.  Still n.p.o., core track placed and start tube feeding. I had long discussion with son and wife at bedside, updated pt current condition, treatment plan and potential prognosis, and answered all the questions.  They expressed understanding and appreciation.   For detailed assessment and plan, please refer to above/below as I have made changes wherever appropriate.   Marvel Plan, MD PhD Stroke Neurology 05/13/2023 6:50 PM  This patient is critically ill due to ICH and IVH, advanced CAA, dysphagia and at significant risk of neurological worsening, death form hematoma expansion, cerebral edema, recurrent ICH, aspiration. This patient's care requires constant monitoring of vital signs, hemodynamics, respiratory and cardiac monitoring, review of multiple databases, neurological assessment, discussion with family, other specialists and medical decision making of high complexity. I spent 45 minutes of neurocritical care time in the care of this patient.    To contact Stroke Continuity provider, please refer to [email or web address]. After hours, contact General Neurology

## 2023-05-13 NOTE — Evaluation (Signed)
Physical Therapy Evaluation Patient Details Name: Derek Yates MRN: [id number] DOB: May 22, 1940 Today's Date: 05/13/2023  History of Present Illness  83 yo male presents to [facility] on 2/19 as code stroke for speech deficits, L weakness. 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  Clinical Impression   Pt presents with weakness L>R, impaired balance with heavy L lateral bias, lethargy, and decreased activity tolerance. Pt to benefit from acute PT to address deficits. Pt requiring max-total +2 assist for EOB sitting and transfer into standing, pt limited by level of arousal and LLE weakness. At baseline pt is ambulatory and performs own ADLs, per family pt was sick with UTI s/p trip overseas 2 weeks ago and has required some help with mobility and ADLs since then. PT to progress mobility as tolerated, and will continue to follow acutely.          If plan is discharge home, recommend the following: Two people to help with walking and/or transfers;Two people to help with bathing/dressing/bathroom   Can travel by private vehicle        Equipment Recommendations None recommended by PT  Recommendations for Other Services       Functional Status Assessment Patient has had a recent decline in their functional status and demonstrates the ability to make significant improvements in function in a reasonable and predictable amount of time.     Precautions / Restrictions Precautions Precautions: Fall Recall of Precautions/Restrictions: Impaired Precaution/Restrictions Comments: Rectal tube, condom cath Restrictions Weight Bearing Restrictions Per Provider Order: No      Mobility  Bed Mobility Overal bed mobility: Needs Assistance Bed Mobility: Supine to Sit, Sit to Supine     Supine to sit: Max assist, +2 for physical assistance Sit to supine: Max assist, +2 for physical assistance   General bed mobility  comments: assist for all aspects    Transfers Overall transfer level: Needs assistance Equipment used: 2 person hand held assist Transfers: Sit to/from Stand Sit to Stand: Total assist, +2 physical assistance           General transfer comment: total +2 for power up, rise, steady, and maintaining upright    Ambulation/Gait                  Stairs            Wheelchair Mobility     Tilt Bed    Modified Rankin (Stroke Patients Only) Modified Rankin (Stroke Patients Only) Pre-Morbid Rankin Score: Slight disability Modified Rankin: Severe disability     Balance Overall balance assessment: Needs assistance Sitting-balance support: Bilateral upper extremity supported, Feet supported Sitting balance-Leahy Scale: Poor Sitting balance - Comments: heavy L lateral bias, difficult to correct Postural control: Left lateral lean Standing balance support: Bilateral upper extremity supported, During functional activity Standing balance-Leahy Scale: Zero Standing balance comment: total +2                             Pertinent Vitals/Pain Pain Assessment Pain Assessment: Faces Faces Pain Scale: Hurts little more Pain Location: generalized Pain Descriptors / Indicators: Grimacing, Discomfort Pain Intervention(s): Monitored during session, Limited activity within patient's tolerance, Repositioned    Home Living Family/patient expects to be discharged to:: Private residence Living Arrangements: Spouse/significant other Available Help at Discharge: Family;Available 24 hours/day Type of Home: House Home Access: Stairs to enter Entrance Stairs-Rails: None (has a post  he holds onto) Secretary/administrator of Steps: 2   Home Layout: Able to live on main level with bedroom/bathroom Home Equipment: Rolling Walker (2 wheels);Cane - single point;Shower seat      Prior Function Prior Level of Function : Needs assist             Mobility Comments: wife  helping with walking since trip [place] x2 weeks ago, has been weak since this trip and getting UTI ADLs Comments: pt's wife has been assisting with all ADLs since getting UTI and recent trip . Prior to this pt was indep     Extremity/Trunk Assessment   Upper Extremity Assessment Upper Extremity Assessment: Defer to OT evaluation    Lower Extremity Assessment Lower Extremity Assessment: Generalized weakness;LLE deficits/detail LLE Deficits / Details: tight hamstrings, lacking 10 deg knee extension. strength assessment limited by level of arousal, but suspect weakness LLE as pt with minimal volitional movemnet LLE    Cervical / Trunk Assessment Cervical / Trunk Assessment: Kyphotic  Communication   Communication Communication: Impaired Factors Affecting Communication: Difficulty expressing self;Reduced clarity of speech    Cognition Arousal: Lethargic Behavior During Therapy: Flat affect   PT - Cognitive impairments: History of cognitive impairments                       PT - Cognition Comments: history of dementia, per pt's family pt typically A&Ox4. Pt is oriented to self, other areas NT. Pt limited by lethargy, follows one-step commands with increased time and inconsistently Following commands: Impaired Following commands impaired: Follows one step commands with increased time, Follows one step commands inconsistently     Cueing Cueing Techniques: Gestural cues, Verbal cues     General Comments General comments (skin integrity, edema, etc.): vss    Exercises     Assessment/Plan    PT Assessment Patient needs continued PT services  PT Problem List Decreased strength;Decreased mobility;Decreased balance;Decreased activity tolerance;Decreased knowledge of use of DME;Pain;Cardiopulmonary status limiting activity;Decreased knowledge of precautions;Decreased range of motion;Decreased safety awareness;Decreased coordination;Decreased cognition       PT Treatment  Interventions Therapeutic activities;DME instruction;Gait training;Therapeutic exercise;Patient/family education;Balance training;Stair training;Functional mobility training;Neuromuscular re-education    PT Goals (Current goals can be found in the Care Plan section)  Acute Rehab PT Goals PT Goal Formulation: With patient/family Time For Goal Achievement: 05/27/23 Potential to Achieve Goals: Fair    Frequency       Co-evaluation PT/OT/SLP Co-Evaluation/Treatment: Yes Reason for Co-Treatment: Necessary to address cognition/behavior during functional activity;For patient/therapist safety;Complexity of the patient's impairments (multi-system involvement) PT goals addressed during session: Mobility/safety with mobility;Balance OT goals addressed during session: ADL's and self-care       AM-PAC PT "6 Clicks" Mobility  Outcome Measure Help needed turning from your back to your side while in a flat bed without using bedrails?: A Lot Help needed moving from lying on your back to sitting on the side of a flat bed without using bedrails?: Total Help needed moving to and from a bed to a chair (including a wheelchair)?: Total Help needed standing up from a chair using your arms (e.g., wheelchair or bedside chair)?: Total Help needed to walk in hospital room?: Total Help needed climbing 3-5 steps with a railing? : Total 6 Click Score: 7    End of Session   Activity Tolerance: Patient limited by fatigue Patient left: in bed;with bed alarm set;with call bell/phone within reach;with family/visitor present Nurse Communication: Mobility status PT Visit Diagnosis: Other abnormalities  of gait and mobility (R26.89);Muscle weakness (generalized) (M62.81)    Time: 6045-4098 PT Time Calculation (min) (ACUTE ONLY): 29 min   Charges:   PT Evaluation $PT Eval Low Complexity: 1 Low   PT General Charges $$ ACUTE PT VISIT: 1 Visit         Marye Round, PT DPT Acute Rehabilitation Services Secure  Chat Preferred  Office [phone]   Valisha Heslin E Stroup 05/13/2023, 11:04 AM

## 2023-05-13 NOTE — Procedures (Signed)
Cortrak  Person Inserting Tube:  Greig Castilla D, RD Tube Type:  Cortrak - 43 inches Tube Size:  10 Tube Location:  Left nare Secured by: Bridle Technique Used to Measure Tube Placement:  Marking at nare/corner of mouth Cortrak Secured At:  76 cm Procedure Comments:  Cortrak Tube Team Note:  Consult received to place a Cortrak feeding tube.   No x-ray is required. RN may begin using tube.   If the tube becomes dislodged please keep the tube and contact the Cortrak team at [email or web address] for replacement.  If after hours and replacement cannot be delayed, place a NG tube and confirm placement with an abdominal x-ray.    Greig Castilla, RD, LDN Registered Dietitian II Please reach out via secure chat Weekend on-call pager # available in [facility]

## 2023-05-13 NOTE — Progress Notes (Signed)
   Inpatient Rehab Admissions Coordinator :  Per therapy recommendations patient was screened for CIR candidacy by Ottie Glazier RN MSN. Patient is not yet at a level to tolerate the intensity required to pursue a CIR admit . The CIR admissions team will follow and monitor for progress and place a Rehab Consult order if felt to be appropriate. Please contact me with any questions.  Ottie Glazier RN MSN Admissions Coordinator [phone]

## 2023-05-14 ENCOUNTER — Inpatient Hospital Stay (HOSPITAL_COMMUNITY): Payer: Medicare Other

## 2023-05-14 LAB — PHOSPHORUS
Phosphorus: 2.2 mg/dL — ABNORMAL LOW (ref 2.5–4.6)
Phosphorus: 2.5 mg/dL (ref 2.5–4.6)

## 2023-05-14 LAB — CBC
HCT: 35 % — ABNORMAL LOW (ref 39.0–52.0)
Hemoglobin: 12.3 g/dL — ABNORMAL LOW (ref 13.0–17.0)
MCH: 28.5 pg (ref 26.0–34.0)
MCHC: 35.1 g/dL (ref 30.0–36.0)
MCV: 81 fL (ref 80.0–100.0)
Platelets: 233 10*3/uL (ref 150–400)
RBC: 4.32 MIL/uL (ref 4.22–5.81)
RDW: 14.7 % (ref 11.5–15.5)
WBC: 11.6 10*3/uL — ABNORMAL HIGH (ref 4.0–10.5)
nRBC: 0 % (ref 0.0–0.2)

## 2023-05-14 LAB — BASIC METABOLIC PANEL
Anion gap: 13 (ref 5–15)
BUN: 30 mg/dL — ABNORMAL HIGH (ref 8–23)
CO2: 26 mmol/L (ref 22–32)
Calcium: 9.7 mg/dL (ref 8.9–10.3)
Chloride: 102 mmol/L (ref 98–111)
Creatinine, Ser: 0.97 mg/dL (ref 0.61–1.24)
GFR, Estimated: >60 mL/min (ref >60)
Glucose, Bld: 160 mg/dL — ABNORMAL HIGH (ref 70–99)
Potassium: 4.1 mmol/L (ref 3.5–5.1)
Sodium: 141 mmol/L (ref 135–145)

## 2023-05-14 LAB — MAGNESIUM
Magnesium: 2.4 mg/dL (ref 1.7–2.4)
Magnesium: 2.4 mg/dL (ref 1.7–2.4)

## 2023-05-14 LAB — GLUCOSE, CAPILLARY
Glucose-Capillary: 128 mg/dL — ABNORMAL HIGH (ref 70–99)
Glucose-Capillary: 129 mg/dL — ABNORMAL HIGH (ref 70–99)
Glucose-Capillary: 145 mg/dL — ABNORMAL HIGH (ref 70–99)
Glucose-Capillary: 169 mg/dL — ABNORMAL HIGH (ref 70–99)
Glucose-Capillary: 178 mg/dL — ABNORMAL HIGH (ref 70–99)
Glucose-Capillary: 82 mg/dL (ref 70–99)

## 2023-05-14 MED ORDER — ORAL CARE MOUTH RINSE
15.0000 mL | OROMUCOSAL | Status: DC
  Administered 2023-05-14 – 2023-06-17 (×136): 15 mL via OROMUCOSAL

## 2023-05-14 MED ORDER — K PHOS MONO-SOD PHOS DI & MONO 155-852-130 MG PO TABS
500.0000 mg | ORAL_TABLET | Freq: Two times a day (BID) | ORAL | Status: AC
  Administered 2023-05-14 (×2): 500 mg
  Filled 2023-05-14 (×2): qty 2

## 2023-05-14 MED ORDER — INSULIN ASPART 100 UNIT/ML IJ SOLN
0.0000 [IU] | INTRAMUSCULAR | Status: DC
  Administered 2023-05-15 – 2023-05-21 (×5): 1 [IU] via SUBCUTANEOUS

## 2023-05-14 MED ORDER — DONEPEZIL HCL 10 MG PO TABS
5.0000 mg | ORAL_TABLET | Freq: Every day | ORAL | Status: DC
  Administered 2023-05-14 – 2023-05-17 (×4): 5 mg
  Filled 2023-05-14 (×4): qty 1

## 2023-05-14 NOTE — Evaluation (Signed)
 Clinical/Bedside Swallow Evaluation Patient Details  Name: GIANN OBARA MRN: [id number] Date of Birth: [birth year]  Today's Date: 05/14/2023 Time: SLP Start Time (ACUTE ONLY): 1210 SLP Stop Time (ACUTE ONLY): 1225 SLP Time Calculation (min) (ACUTE ONLY): 15 min  Past Medical History: No past medical history on file. Past Surgical History: No past surgical history on file. HPI:  Patient is an 83 y.o. male with PMH: dementia, recurrent UTI's, CVA, HTN. He presented to the hospital on 05/11/23 as a code stroke with speech deficits and left sided weakness. 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 Patient has been NPO; Cortrak placed 2/21. He failed [facility] swallow screen with RN.    Assessment / Plan / Recommendation  Clinical Impression  Patient presents with clinical s/s of dysphagia as per this bedside swallow evaluation. He was in recliner chair with eyes closed but was able to open eyes and maintain brief periods of alertness. He was able to follow basic commands during oral care and PO trials. Oral mucosa was dry with sticky and dried secretions on tongue, teeth, lateral sulci and hard palate. SLP performed oral care and removed some secretions, however dried secretions on lingual surface and some on hard palate remained. Patient was able to follow command to "cough hard" and when he did so, was able to mobilize secretions to oropharynx with SLP then suctioning out with Phillips Grout. Suctioned secretions were thick, wet and light yellow in color. Patient then able to lightly masticate a single ice chip but after prolonged oral phase, he did not exhibit a swallow intiation. A delayed, congested sounding cough followed. SLP did not attempt any further PO's as patient unable to maintain adequate alertness. SLP will follow for PO readiness. SLP Visit Diagnosis: Dysphagia, unspecified  (R13.10)    Aspiration Risk  Moderate aspiration risk;Risk for inadequate nutrition/hydration    Diet Recommendation NPO    Medication Administration: Via alternative means    Other  Recommendations Oral Care Recommendations: Oral care QID    Recommendations for follow up therapy are one component of a multi-disciplinary discharge planning process, led by the attending physician.  Recommendations may be updated based on patient status, additional functional criteria and insurance authorization.  Follow up Recommendations Acute inpatient rehab (3hours/day)      Assistance Recommended at Discharge    Functional Status Assessment Patient has had a recent decline in their functional status and demonstrates the ability to make significant improvements in function in a reasonable and predictable amount of time.  Frequency and Duration min 2x/week  2 weeks       Prognosis Prognosis for improved oropharyngeal function: Good Barriers to Reach Goals: Cognitive deficits      Swallow Study   General Date of Onset: 05/11/23 HPI: Patient is an 83 y.o. male with PMH: dementia, recurrent UTI's, CVA, HTN. He presented to the hospital on 05/11/23 as a code stroke with speech deficits and left sided weakness. 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 Patient has been NPO; Cortrak placed 2/21. He failed [facility] swallow screen with RN. Type of Study: Bedside Swallow Evaluation Previous Swallow Assessment: none found Diet Prior to this Study: NPO Temperature Spikes Noted: No Respiratory Status: Nasal cannula History of Recent Intubation: No Behavior/Cognition: Lethargic/Drowsy;Requires cueing;Cooperative Oral Cavity Assessment: Dry;Dried secretions;Excessive secretions Oral Care  Completed by SLP: Yes Oral Cavity - Dentition: Adequate natural dentition Self-Feeding Abilities: Total  assist Patient Positioning: Upright in chair Baseline Vocal Quality: Low vocal intensity Volitional Cough: Congested;Weak Volitional Swallow: Unable to elicit    Oral/Motor/Sensory Function Overall Oral Motor/Sensory Function: Generalized oral weakness   Ice Chips Ice chips: Impaired Presentation: Spoon Oral Phase Functional Implications: Prolonged oral transit Pharyngeal Phase Impairments: Suspected delayed Swallow;Cough - Delayed   Thin Liquid Thin Liquid: Not tested    Nectar Thick Nectar Thick Liquid: Not tested   Honey Thick Honey Thick Liquid: Not tested   Puree Puree: Not tested   Solid     Solid: Not tested      Angela Nevin, MA, CCC-SLP Speech Therapy

## 2023-05-14 NOTE — Progress Notes (Signed)
 Physical Therapy Treatment Patient Details Name: Derek Yates MRN: [id number] DOB: Nov 09, 1940 Today's Date: 05/14/2023   History of Present Illness 83 yo male presents to [facility] on 2/19 as code stroke for speech deficits, L weakness. 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    PT Comments  Despite still being very lethargic, with eyes closed throughout the session, Derek Yates was able to physically do a bit more today than in previous sessions, sitting EOB with min assist to close supervision at times, standing x2 with one person heavy mod assist and transferring OOB to the recliner chair with the sky lift.  His family (wife and granddaughter) were there to assist in attempting to keep him aroused and engaged.  PT will continue to follow acutely for safe mobility progression.  I am wondering if he would tolerate the standing frame to keep him up on his feet longer (it was therapist fatigue limiting his standing time today).    If plan is discharge home, recommend the following: Two people to help with walking and/or transfers;Two people to help with bathing/dressing/bathroom   Can travel by Doctor, hospital (measurements PT);Wheelchair cushion (measurements PT)    Recommendations for Other Services Rehab consult     Precautions / Restrictions Precautions Precautions: Fall     Mobility  Bed Mobility Overal bed mobility: Needs Assistance Bed Mobility: Supine to Sit, Sit to Supine     Supine to sit: Max assist, HOB elevated Sit to supine: Max assist, HOB elevated   General bed mobility comments: Pt able to assist slowly with increased time, however, most of movement performed by PT.  Assist at trunk and with bil LEs.  Appears to move his right arm more and props more with his right arm once sitting.  Came over to R side of bed.    Transfers Overall transfer level:  Needs assistance Equipment used: None Transfers: Sit to/from Stand Sit to Stand: Mod assist, From elevated surface           General transfer comment: Heavy mod assist to stand x 2 with left knee blocked.  assist needed to initiate movement and to help with bil hip extension (bed pad used on second stand) cues for upright posture. Total lift used to move pt safely to chair as it would be helpful to have a second person assist with a stand or squat pivot for safety. Transfer via Lift Equipment: [place]  Ambulation/Gait               General Gait Details: unable at this time          Balance Overall balance assessment: Needs assistance Sitting-balance support: Feet supported, Bilateral upper extremity supported Sitting balance-Leahy Scale: Poor Sitting balance - Comments: generally needs min assist to maintain sitting, can mantain with close supervision for a few seconds (<10 sec) before losing balance to the left.  Assist needed to get in tripod prop with bil UEs, unable to report that he is leaning when asked, but also does not have eyes open for visual input. Postural control: Left lateral lean Standing balance support: No upper extremity supported Standing balance-Leahy Scale: Poor Standing balance comment: Heavy mod assist of one person to stand EOB x 30 seconds each  Communication Communication Communication: Impaired Factors Affecting Communication: Difficulty expressing self;Reduced clarity of speech  Cognition Arousal: Lethargic Behavior During Therapy: Flat affect   PT - Cognitive impairments: History of cognitive impairments                       PT - Cognition Comments: history of dementia, per pt's family pt typically A&Ox4. Pt is oriented to self, following some basic commands <30% and difficult to keep eyes open. Following commands: Impaired Following commands impaired: Follows one step commands with  increased time, Follows one step commands inconsistently    Cueing Cueing Techniques: Verbal cues, Tactile cues  Exercises      General Comments General comments (skin integrity, edema, etc.): VSS throughout, did remove O2 for transfer and pt maintained in the mid to low 90s, 2 L O2  re-applied at the end of the session.      Pertinent Vitals/Pain Pain Assessment Pain Assessment: Faces Pain Score: 0-No pain    Home Living                          Prior Function            PT Goals (current goals can now be found in the care plan section) Progress towards PT goals: Progressing toward goals    Frequency    Min 1X/week      PT Plan      Co-evaluation              AM-PAC PT "6 Clicks" Mobility   Outcome Measure  Help needed turning from your back to your side while in a flat bed without using bedrails?: Total Help needed moving from lying on your back to sitting on the side of a flat bed without using bedrails?: Total Help needed moving to and from a bed to a chair (including a wheelchair)?: Total Help needed standing up from a chair using your arms (e.g., wheelchair or bedside chair)?: Total Help needed to walk in hospital room?: Total Help needed climbing 3-5 steps with a railing? : Total 6 Click Score: 6    End of Session Equipment Utilized During Treatment: Oxygen Activity Tolerance: Patient limited by lethargy Patient left: in chair;with call bell/phone within reach;with chair alarm set Nurse Communication: Mobility status PT Visit Diagnosis: Other abnormalities of gait and mobility (R26.89);Muscle weakness (generalized) (M62.81)     Time: 1610-9604 PT Time Calculation (min) (ACUTE ONLY): 48 min  Charges:    $Therapeutic Activity: 23-37 mins $Neuromuscular Re-education: 8-22 mins PT General Charges $$ ACUTE PT VISIT: 1 Visit                     Corinna Capra, PT, DPT  Acute Rehabilitation Secure chat is best for contact #[phone]  [phone] office    05/14/2023, 12:44 PM

## 2023-05-14 NOTE — Progress Notes (Signed)
 STROKE TEAM PROGRESS NOTE   INTERIM HISTORY/SUBJECTIVE  Family at the bedside. No acute overnight events. Repeat head CT stable. Sliding scale started.  On exam he is very drowsy and hypophonic speech, he follows commands but does not answer orientation questions correctly.  He has a right gaze preference can cross midline but not all the way to a left gaze.  Inconsistent blink to threat, left arm weakness, he does not lift his legs off the bed, can wiggle toes Febrile, will repeat CXR and obtain blood culture.    OBJECTIVE  CBC    Component Value Date/Time   WBC 11.6 (H) 05/14/2023 0412   RBC 4.32 05/14/2023 0412   HGB 12.3 (L) 05/14/2023 0412   HGB 13.3 02/03/2023 0852   HCT 35.0 (L) 05/14/2023 0412   HCT 39.9 02/03/2023 0852   PLT 233 05/14/2023 0412   PLT 271 02/03/2023 0852   MCV 81.0 05/14/2023 0412   MCV 87 02/03/2023 0852   MCH 28.5 05/14/2023 0412   MCHC 35.1 05/14/2023 0412   RDW 14.7 05/14/2023 0412   RDW 13.2 02/03/2023 0852   LYMPHSABS 2.8 05/11/2023 1344   LYMPHSABS 2.6 02/03/2023 0852   MONOABS 0.8 05/11/2023 1344   EOSABS 0.1 05/11/2023 1344   EOSABS 0.3 02/03/2023 0852   BASOSABS 0.0 05/11/2023 1344   BASOSABS 0.1 02/03/2023 0852    BMET    Component Value Date/Time   NA 141 05/14/2023 0412   NA 143 02/03/2023 0852   K 4.1 05/14/2023 0412   CL 102 05/14/2023 0412   CO2 26 05/14/2023 0412   GLUCOSE 160 (H) 05/14/2023 0412   BUN 30 (H) 05/14/2023 0412   BUN 15 02/03/2023 0852   CREATININE 0.97 05/14/2023 0412   CALCIUM 9.7 05/14/2023 0412   EGFR 76 02/03/2023 0852   GFRNONAA >60 05/14/2023 0412    IMAGING past 24 hours CT HEAD WO CONTRAST ( ) Result Date: 05/14/2023 CLINICAL DATA:  83 year old male status post code stroke presentation on 05/11/2023 with right basal ganglia hemorrhage. Subsequent encounter. EXAM: CT HEAD WITHOUT CONTRAST TECHNIQUE: Contiguous axial images were obtained from the base of the skull through the vertex without  intravenous contrast. RADIATION DOSE REDUCTION: This exam was performed according to the departmental dose-optimization program which includes automated exposure control, adjustment of the mA and/or kV according to patient size and/or use of iterative reconstruction technique. COMPARISON:  Head CT 05/13/2023 and earlier. FINDINGS: Brain: Stable size and configuration of round, oval hyperdense hemorrhage in the right basal ganglia (series 3, image 18). Stable small to moderate volume of intraventricular hemorrhage. Stable ventricle size and configuration. Stable regional edema and mild intracranial mass effect with up to 3 mm of midline shift at the septum pellucidum. Basilar cisterns remain patent. No new intracranial hemorrhage identified. Multifocal chronic encephalomalacia again noted. Stable gray-white matter differentiation throughout the brain. Vascular: Extensive Calcified atherosclerosis at the skull base. No suspicious intracranial vascular hyperdensity. Skull: Intact, stable. Sinuses/Orbits: Left nasoenteric tube now in place. Visualized paranasal sinuses and mastoids are stable and well aerated. Other: Stable orbit and scalp soft tissues. IMPRESSION: 1. Stable since yesterday right basal ganglia hemorrhage, intraventricular extension, mild intracranial mass effect. 2. No new intracranial abnormality. 3. New nasoenteric tube. Electronically Signed   By: Odessa Fleming M.D.   On: 05/14/2023 06:02    Vitals:   05/14/23 1300 05/14/23 1405 05/14/23 1417 05/14/23 1600  BP: 125/70 (!) 151/77 126/72   Pulse: 77 80 78   Resp: 19 18 19  Temp:    (!) 101.5 F (38.6 C)  TempSrc:    Axillary  SpO2: 99% 99% 99%   Weight:         PHYSICAL EXAM General:  Alert, well-nourished, well-developed patient in no acute distress Psych:  Mood and affect appropriate for situation CV: Regular rate and rhythm on monitor Respiratory:  Regular, unlabored respirations on room air GI: Abdomen soft and nontender  NEURO:   Mental Status: Drowsy, but will open eyes to loud voice.  Not oriented today poor attention.  Follows commands Speech/Language: Hypophonic speech speech is without dysarthria or aphasia.  Naming, repetition, fluency, and comprehension intact.  Cranial Nerves:  II: PERRL.  Intermittent blink to threat on left III, IV, VI: EOMI. Eyelids elevate symmetrically.  V: Sensation is intact to light touch and symmetrical to face.  VII: Left facial droop VIII: hearing intact to voice. IX, X: Palate elevates symmetrically. Phonation is normal.  NG:EXBMWUXL shrug 5/5. XII: tongue is midline without fasciculations. Quiet voice.  Motor: Left arm weakness 3/5, bilateral lower extremities not antigravity, can wiggle toes Tone: is normal and bulk is normal Sensation- Intact to light touch bilaterally. Extinction absent to light touch to DSS.   Coordination: FTN intact bilaterally, HKS: no ataxia in BLE.No drift.  Gait- deferred  Most Recent NIH: 20 on exam.      ASSESSMENT/PLAN  [name] is a 83 y.o. male with history of hypertension, CVA, recurrent UTIs, dementia who presented to [facility] as a code stroke due to speech deficits, left-sided deficits.  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 Intraventricular penetration with the amount of intraventricular blood being similar. CTA not pursued as it will not change mangement MRI ICH centered at the right caudate head with IVH and mild communicating hydrocephalus. 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 Old left frontal, left occipital and right parieto-occipital infarcts. CT repeat  2/21: Roughly 6 mL Right basal ganglia hemorrhage has increased from approximately 4 mL at presentation. Stable moderate volume of IVH. No acute ventriculomegaly.  Mild leftward shift of 3 mm stable CT head repeat stable 2D Echo: LVEF 65 to 70%  LDL 69 HgbA1c 5.7 VTE prophylaxis - SCDs No antithrombotic prior to admission, now on No antithrombotic due to ICH and CAA Therapy recommendations: CIR Disposition pending  Advanced CAA MRI showed advanced CAA No antithrombotics Strict BP control less than 140 Recommend no statin at this time  Hypertension Home meds:  nifedipine 30mg  Stable BP goal less than 140 given CAA On amlodipine 10 Strict BP control at home  Hyperlipidemia Home meds:  lipitor 10mg  LDL 66, goal < 70 Recommend no statin given advanced CAA  Dysphagia Patient has post-stroke dysphagia SLP consulted Now n.p.o. 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  Hospital day # 3   This patient is critically ill due to ICH and IVH, advanced CAA, dysphagia and at significant risk of neurological worsening, death form hematoma expansion, cerebral edema, recurrent ICH, aspiration. This patient's care requires constant monitoring of vital signs, hemodynamics, respiratory and cardiac monitoring, review of multiple databases, neurological assessment, discussion with family, other  specialists and medical decision making of high complexity. I spent 35 minutes of neurocritical care time in the care of this patient.    Windell Norfolk, MD  Neurology    To contact Stroke Continuity provider, please refer to [email or web address]. After hours, contact General Neurology

## 2023-05-15 DIAGNOSIS — I610 Nontraumatic intracerebral hemorrhage in hemisphere, subcortical: Secondary | ICD-10-CM

## 2023-05-15 DIAGNOSIS — R1310 Dysphagia, unspecified: Secondary | ICD-10-CM

## 2023-05-15 DIAGNOSIS — I618 Other nontraumatic intracerebral hemorrhage: Secondary | ICD-10-CM | POA: Diagnosis not present

## 2023-05-15 DIAGNOSIS — I10 Essential (primary) hypertension: Secondary | ICD-10-CM

## 2023-05-15 DIAGNOSIS — R509 Fever, unspecified: Secondary | ICD-10-CM | POA: Diagnosis not present

## 2023-05-15 LAB — BASIC METABOLIC PANEL
Anion gap: 13 (ref 5–15)
BUN: 29 mg/dL — ABNORMAL HIGH (ref 8–23)
CO2: 27 mmol/L (ref 22–32)
Calcium: 9.2 mg/dL (ref 8.9–10.3)
Chloride: 101 mmol/L (ref 98–111)
Creatinine, Ser: 0.94 mg/dL (ref 0.61–1.24)
GFR, Estimated: >60 mL/min (ref >60)
Glucose, Bld: 143 mg/dL — ABNORMAL HIGH (ref 70–99)
Potassium: 4.2 mmol/L (ref 3.5–5.1)
Sodium: 141 mmol/L (ref 135–145)

## 2023-05-15 LAB — GLUCOSE, CAPILLARY
Glucose-Capillary: 125 mg/dL — ABNORMAL HIGH (ref 70–99)
Glucose-Capillary: 137 mg/dL — ABNORMAL HIGH (ref 70–99)
Glucose-Capillary: 139 mg/dL — ABNORMAL HIGH (ref 70–99)
Glucose-Capillary: 141 mg/dL — ABNORMAL HIGH (ref 70–99)
Glucose-Capillary: 158 mg/dL — ABNORMAL HIGH (ref 70–99)
Glucose-Capillary: 166 mg/dL — ABNORMAL HIGH (ref 70–99)

## 2023-05-15 LAB — RESPIRATORY PANEL BY PCR

## 2023-05-15 LAB — CBC
HCT: 35.3 % — ABNORMAL LOW (ref 39.0–52.0)
Hemoglobin: 12 g/dL — ABNORMAL LOW (ref 13.0–17.0)
MCH: 28.1 pg (ref 26.0–34.0)
MCHC: 34 g/dL (ref 30.0–36.0)
MCV: 82.7 fL (ref 80.0–100.0)
Platelets: 236 10*3/uL (ref 150–400)
RBC: 4.27 MIL/uL (ref 4.22–5.81)
RDW: 15 % (ref 11.5–15.5)
WBC: 10.9 10*3/uL — ABNORMAL HIGH (ref 4.0–10.5)
nRBC: 0 % (ref 0.0–0.2)

## 2023-05-15 MED ORDER — SODIUM CHLORIDE 0.9 % IV SOLN: 1.0000 g | INTRAVENOUS | Status: DC

## 2023-05-15 MED ORDER — SODIUM CHLORIDE 0.9 % IV SOLN: INTRAVENOUS | Status: AC | PRN

## 2023-05-15 MED ORDER — SODIUM CHLORIDE 0.9 % IV SOLN
2.0000 g | INTRAVENOUS | Status: DC
  Administered 2023-05-15: 2 g via INTRAVENOUS
  Filled 2023-05-15: qty 20

## 2023-05-15 MED ORDER — HEPARIN SODIUM (PORCINE) 5000 UNIT/ML IJ SOLN
5000.0000 [IU] | Freq: Two times a day (BID) | INTRAMUSCULAR | Status: DC
  Administered 2023-05-15 – 2023-06-23 (×78): 5000 [IU] via SUBCUTANEOUS
  Filled 2023-05-15 (×79): qty 1

## 2023-05-15 NOTE — Progress Notes (Signed)
 Orthopedic Tech Progress Note Patient Details:  Derek Yates 1940-12-26 [id number]  Patient ID: Derek Yates, male   DOB: [birth year], 83 y.o.   MRN: [id number] RN called requesting Prafo boots, however there is no order for Prafo boots, only prevalon and those come from materials not ortho techs. [place] A Jess Toney 05/15/2023, 8:00 AM

## 2023-05-15 NOTE — Plan of Care (Signed)
   Problem: Education: Goal: Knowledge of General Education information will improve Description: Including pain rating scale, medication(s)/side effects and non-pharmacologic comfort measures Outcome: Progressing   Problem: Health Behavior/Discharge Planning: Goal: Ability to manage health-related needs will improve Outcome: Progressing   Problem: Clinical Measurements: Goal: Ability to maintain clinical measurements within normal limits will improve Outcome: Progressing Goal: Diagnostic test results will improve Outcome: Progressing   Problem: Coping: Goal: Level of anxiety will decrease Outcome: Progressing

## 2023-05-15 NOTE — Progress Notes (Signed)
 Orthopedic Tech Progress Note Patient Details:  Derek Yates 31-Oct-1940 [id number]  Ortho Devices Type of Ortho Device: Prafo boot/shoe Ortho Device/Splint Location: BLE Ortho Device/Splint Interventions: Ordered, Application, Adjustment   Post Interventions Patient Tolerated: Well  [name] 05/15/2023, 8:29 AM

## 2023-05-15 NOTE — Progress Notes (Signed)
 Speech Language Pathology Treatment: Dysphagia  Patient Details Name: Derek Yates MRN: [id number] DOB: Aug 05, 1940 Today's Date: 05/15/2023 Time: 1025-1040 SLP Time Calculation (min) (ACUTE ONLY): 15 min  Assessment / Plan / Recommendation Clinical Impression  Patient seen by SLP for skilled treatment focused on dysphagia goals. His son was in the room. Son reported that patient is more lethargic today as compared to yesterday. He asked appropriate questions about anticipation of length of time for some recovery, etc. SLP discussed factors contributing to his current state of lethargy, including advanced age, h/o dementia, h/o CVA (son reported it was about 12 years ago but he recovered fully) and now new hemorrhagic basal ganglia CVA. Son indicated that patient's dementia was a fairly new diagnosis and was from short term memory loss but he denied any loss of function in patient's ability to complete ADL's, etc. He did report that after recent UTI, patient's spouse was following him around when he was walking in the house for safety as patient refused to use a cane.  Patient had eyes closed but would open mouth, stick out tongue, etc during oral care. He did not open his eyes or attempt any verbalizations as he did previous date. Patient did have one productive cough with SLP cueing him to "cough hard" and SLP then suctioned out secretions from oral cavity. Patient is still not alert enough for safe PO trials or for formal speech-language cognitive evaluation. SLP will continue to follow for readiness.    HPI HPI: Patient is an 83 y.o. male with PMH: dementia, recurrent UTI's, CVA, HTN. He presented to the hospital on 05/11/23 as a code stroke with speech deficits and left sided weakness. 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 Patient has been NPO; Cortrak placed 2/21. He failed  [facility] swallow screen with RN.      SLP Plan  Continue with current plan of care      Recommendations for follow up therapy are one component of a multi-disciplinary discharge planning process, led by the attending physician.  Recommendations may be updated based on patient status, additional functional criteria and insurance authorization.    Recommendations  Diet recommendations: NPO Medication Administration: Via alternative means                  Oral care QID   Frequent or constant Supervision/Assistance Dysphagia, unspecified (R13.10)     Continue with current plan of care    Angela Nevin, MA, CCC-SLP Speech Therapy

## 2023-05-15 NOTE — Consult Note (Signed)
   NAME:  Derek Yates, MRN:  [id number], DOB:  1940-10-15, LOS: 4 ADMISSION DATE:  05/11/2023, CONSULTATION DATE:  05/15/2023 REFERRING MD:  Teresa Coombs, CHIEF COMPLAINT:  fever   History of Present Illness:  83 year old man with dysphagia from right basal ganglia ICH. Presented 2/19 with difficulty speaking and with left sided weakness.  ICH score 2. Currently NPO and receiving Cortrak feeds.  Has been lethargic since admission.   Increasing fevers in the past 48h with spike to 39.5C today. More lethargic.   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  Early dementia.   Significant Hospital Events: Including procedures, antibiotic start and stop dates in addition to other pertinent events   2.19 - admitted with right caudate bleed. ICH score 2  Interim History / Subjective:  More lethargic today. + productive cough but unable to expectorate secretions.  Objective   Blood pressure (!) 137/120, pulse (!) 115, temperature (!) 103.1 F (39.5 C), temperature source Axillary, resp. rate 20, weight 61.8 kg, SpO2 99%.        Intake/Output Summary (Last 24 hours) at 05/15/2023 1300 Last data filed at 05/15/2023 0800 Gross per 24 hour  Intake 1020 ml  Output 1210 ml  Net -190 ml   Filed Weights   05/11/23 2015 05/14/23 0436 05/15/23 0441  Weight: 61.9 kg 60.9 kg 61.8 kg    Examination: General: thin man sitting quietly in bed.  HENT: moist mucosa. + upper airway secretions.  Lungs: clear Cardiovascular: HS normal Abdomen: soft Extremities: warm, no edema.  Neuro: somnolent but will awake and answer questions appropriately. Speech is dysarthric. LUE weakness.  GU: external catheter.   Ancillary Tests Personally Reviewed:   Mild leukocytosis.  Acceptable glycemic control.  Assessment and Plan:  Fever likely to due to recurrent silent aspiration from dysphagia/dysarthria from ICH. Right caudate ICH HTN Possible dementia.  Plan:   - start  ceftriaxone for aspiration pneumonia, but likely will be an ongoing problem.  - continue strict NPO, meds per tube.  - consider neuro-stimulant if lethargy persists.  - decrease neurcheck frequency to allow for less disrupted sleep.   Best Practice (right click and "Reselect all SmartList Selections" daily)   Diet/type: tubefeeds DVT prophylaxis prophylactic heparin  Pressure ulcer(s): N/A GI prophylaxis: N/A Lines: N/A Foley:  N/A Code Status:  full code Last date of multidisciplinary goals of care discussion [per primary team]  Lynnell Catalan, MD [facility] ICU Physician [facility]  Pager: [phone] Or Epic Secure Chat After hours: [phone].  05/15/2023, 1:01 PM

## 2023-05-15 NOTE — Progress Notes (Signed)
 Physical Therapy Treatment Patient Details Name: Derek Yates MRN: [id number] DOB: 1940/03/23 Today's Date: 05/15/2023   History of Present Illness 83 yo male presents to [facility] on 2/19 as code stroke for speech deficits, L weakness. 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    PT Comments  Pt is more lethargic today and resisting movement throughout session.  I was hardly able to get him to sit unsupported and was not able to attempt standing.  Per son, he has been running a fever today which may be impacting his physical mobility. I repositioned him back in bed with use of the maxi sky lift, performed 4 extremity ROM and positioned him on his left side.  Son asked if he could do ROM and I encouraged him to do what he can.  At this point, pt is looking like he may need longer term post acute rehab, so I updated d/c recs.  We will update if he improves over next week. PT will continue to follow acutely for safe mobility progression.    If plan is discharge home, recommend the following: Two people to help with walking and/or transfers;Two people to help with bathing/dressing/bathroom   Can travel by private vehicle     No  Equipment Recommendations  Wheelchair (measurements PT);Wheelchair cushion (measurements PT);Hospital bed;Hoyer lift    Recommendations for Other Services       Precautions / Restrictions Precautions Precautions: Fall Recall of Precautions/Restrictions: Impaired Precaution/Restrictions Comments: Rectal tube, condom cath     Mobility  Bed Mobility Overal bed mobility: Needs Assistance Bed Mobility: Rolling Rolling: +2 for physical assistance, Total assist   Supine to sit: +2 for physical assistance, Total assist     General bed mobility comments: Pt was up in the recliner chair attempted to sit up with feet down and back off of back rest, pt actively resisting forward motion, son  assisting. attempted x 4 without much success as pt pushing actively in the other direction.  Attempted to disengage hands.    Transfers Overall transfer level: Needs assistance                 General transfer comment: maxi sky used to get pt back to bed from recliner chair.  He was up in the recliner for over an hour.  RN was about to get him back anyway. Transfer via Air cabin crew Rankin (Stroke Patients Only) Modified Rankin (Stroke Patients Only) Pre-Morbid Rankin Score: Slight disability Modified Rankin: Severe disability     Balance                                            Communication Communication Communication: Impaired Factors Affecting Communication: Difficulty expressing self;Reduced clarity of speech  Cognition Arousal: Lethargic (more lethargic than yesterday) Behavior During Therapy: Flat affect   PT - Cognitive impairments: History of cognitive impairments                       PT - Cognition Comments: history of dementia, per pt's family  pt typically A&Ox4. Pt is oriented to self, following some basic commands 0% of time today. Following commands: Impaired Following commands impaired:  (not following any commands for me today, resisting every movement we attempted including PROM)    Cueing Cueing Techniques: Verbal cues, Tactile cues  Exercises      General Comments General comments (skin integrity, edema, etc.): 4 extremity PROM, ankle DF, knee flex, hip flex, hip abduct bil x 10 reps, intially active resistance, relaxed more as the reps increased,  Bil UE elbow flex/ext, shoulder flex, shoulder abduction (same, inital resistance, then relaxed as reps increased). PRAFOs and SCDs donned at end of session.  O2 via Sophia removed for transfer with sats still in the low 90s on RA.  O2 re-applied once  positioned in bed in R sidelying.      Pertinent Vitals/Pain Pain Assessment Pain Assessment: Faces Pain Score: 0-No pain    Home Living                          Prior Function            PT Goals (current goals can now be found in the care plan section) Acute Rehab PT Goals Patient Stated Goal: son present today, wants his dad to be more alert, progressing.  Feels like today is worse (I agree). Progress towards PT goals: Progressing toward goals    Frequency    Min 1X/week      PT Plan      Co-evaluation              AM-PAC PT "6 Clicks" Mobility   Outcome Measure  Help needed turning from your back to your side while in a flat bed without using bedrails?: Total Help needed moving from lying on your back to sitting on the side of a flat bed without using bedrails?: Total Help needed moving to and from a bed to a chair (including a wheelchair)?: Total Help needed standing up from a chair using your arms (e.g., wheelchair or bedside chair)?: Total Help needed to walk in hospital room?: Total Help needed climbing 3-5 steps with a railing? : Total 6 Click Score: 6    End of Session Equipment Utilized During Treatment: Oxygen Activity Tolerance: Patient limited by lethargy Patient left: in bed;with call bell/phone within reach Nurse Communication: Mobility status;Other (comment) (son asking if tube feed needs to be plugged back in.) PT Visit Diagnosis: Other abnormalities of gait and mobility (R26.89);Muscle weakness (generalized) (M62.81)     Time: 1610-9604 PT Time Calculation (min) (ACUTE ONLY): 35 min  Charges:    $Therapeutic Exercise: 8-22 mins $Therapeutic Activity: 8-22 mins PT General Charges $$ ACUTE PT VISIT: 1 Visit                     Corinna Capra, PT, DPT  Acute Rehabilitation Secure chat is best for contact #[phone] office

## 2023-05-15 NOTE — Progress Notes (Signed)
 STROKE TEAM PROGRESS NOTE   INTERIM HISTORY/SUBJECTIVE  Family at the bedside. No acute overnight events. Repeat chest xray with no active disease, blood culture negative so far. Patient likely with aspiration pneumonia, CCM consulted.    OBJECTIVE  CBC    Component Value Date/Time   WBC 10.9 (H) 05/15/2023 0502   RBC 4.27 05/15/2023 0502   HGB 12.0 (L) 05/15/2023 0502   HGB 13.3 02/03/2023 0852   HCT 35.3 (L) 05/15/2023 0502   HCT 39.9 02/03/2023 0852   PLT 236 05/15/2023 0502   PLT 271 02/03/2023 0852   MCV 82.7 05/15/2023 0502   MCV 87 02/03/2023 0852   MCH 28.1 05/15/2023 0502   MCHC 34.0 05/15/2023 0502   RDW 15.0 05/15/2023 0502   RDW 13.2 02/03/2023 0852   LYMPHSABS 2.8 05/11/2023 1344   LYMPHSABS 2.6 02/03/2023 0852   MONOABS 0.8 05/11/2023 1344   EOSABS 0.1 05/11/2023 1344   EOSABS 0.3 02/03/2023 0852   BASOSABS 0.0 05/11/2023 1344   BASOSABS 0.1 02/03/2023 0852    BMET    Component Value Date/Time   NA 141 05/15/2023 0502   NA 143 02/03/2023 0852   K 4.2 05/15/2023 0502   CL 101 05/15/2023 0502   CO2 27 05/15/2023 0502   GLUCOSE 143 (H) 05/15/2023 0502   BUN 29 (H) 05/15/2023 0502   BUN 15 02/03/2023 0852   CREATININE 0.94 05/15/2023 0502   CALCIUM 9.2 05/15/2023 0502   EGFR 76 02/03/2023 0852   GFRNONAA >60 05/15/2023 0502    IMAGING past 24 hours DG Chest Port 1 View Result Date: 05/14/2023 CLINICAL DATA:  Aspiration, history of stroke EXAM: PORTABLE CHEST 1 VIEW COMPARISON:  05/12/2023 FINDINGS: Single frontal view of the chest demonstrates enteric catheter passing below diaphragm, tip excluded by collimation. The cardiac silhouette is unremarkable. Stable ectasia and atherosclerosis of the thoracic aorta. No airspace disease, effusion, or pneumothorax. No acute bony abnormalities. IMPRESSION: 1. No acute intrathoracic process. No radiographic evidence of aspiration. Electronically Signed   By: Sharlet Salina M.D.   On: 05/14/2023 16:48    Vitals:    05/15/23 1000 05/15/23 1100 05/15/23 1200 05/15/23 1300  BP: (!) 145/67 (!) 137/120  (!) 152/75  Pulse: 75 92 (!) 115 (!) 108  Resp: (!) 23 (!) 27 20 (!) 25  Temp:   (!) 103.1 F (39.5 C)   TempSrc:   Axillary   SpO2: 96% 97% 99% 97%  Weight:         PHYSICAL EXAM General:  Alert, well-nourished, well-developed patient in no acute distress Psych:  Mood and affect appropriate for situation CV: Regular rate and rhythm on monitor Respiratory:  Regular, unlabored respirations on room air GI: Abdomen soft and nontender  NEURO:  Mental Status: Drowsy, but will open eyes to loud voice.  Not oriented today poor attention.  Follows commands Speech/Language: Hypophonic speech speech is without dysarthria or aphasia.  Naming, repetition, fluency, and comprehension intact.  Cranial Nerves:  II: PERRL.  Intermittent blink to threat on left III, IV, VI: EOMI. Eyelids elevate symmetrically.  V: Sensation is intact to light touch and symmetrical to face.  VII: Left facial droop VIII: hearing intact to voice. IX, X: Palate elevates symmetrically. Phonation is normal.  WU:JWJXBJYN shrug 5/5. XII: tongue is midline without fasciculations. Quiet voice.  Motor: Left arm weakness 3/5, bilateral lower extremities not antigravity, can wiggle toes Tone: is normal and bulk is normal Sensation- Intact to light touch bilaterally. Extinction absent to light  touch to DSS.   Coordination: FTN intact bilaterally, HKS: no ataxia in BLE.No drift.  Gait- deferred  Most Recent NIH: 20 on exam.      ASSESSMENT/PLAN  [name] is a 83 y.o. male with history of hypertension, CVA, recurrent UTIs, dementia who presented to [facility] as a code stroke due to speech deficits, left-sided deficits.  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 Intraventricular penetration with the amount of intraventricular blood being similar. CTA not pursued as it will not change mangement MRI ICH centered at the right caudate head with IVH and mild communicating hydrocephalus. 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 Old left frontal, left occipital and right parieto-occipital infarcts. CT repeat  2/21: Roughly 6 mL Right basal ganglia hemorrhage has increased from approximately 4 mL at presentation. Stable moderate volume of IVH. No acute ventriculomegaly.  Mild leftward shift of 3 mm stable CT head repeat 2/22 stable 2D Echo: LVEF 65 to 70%  LDL 69 HgbA1c 5.7 VTE prophylaxis - SCDs No antithrombotic prior to admission, now on No antithrombotic due to ICH and CAA Therapy recommendations: CIR Disposition pending  Advanced CAA MRI showed advanced CAA No antithrombotics Strict BP control less than 140 Recommend no statin at this time  Hypertension Home meds:  nifedipine 30mg  Stable BP goal less than 140 given CAA On amlodipine 10 Strict BP control at home  Hyperlipidemia Home meds:  lipitor 10mg  LDL 66, goal < 70 Recommend no statin given advanced CAA  Dysphagia Patient has post-stroke dysphagia SLP consulted Now n.p.o. 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     Likely aspiration pneumonia, CCM consulted, patient started on Ceftriaxone   Hospital day # 4   This patient is critically ill due to ICH and IVH, advanced CAA, dysphagia and at significant risk of neurological worsening, death form hematoma expansion, cerebral edema, recurrent ICH, aspiration. This patient's care requires constant monitoring of vital signs, hemodynamics, respiratory and cardiac monitoring, review of multiple databases, neurological assessment, discussion with family, other specialists and medical decision making of high complexity. I spent 35 minutes of neurocritical care time in the care of this patient.    Windell Norfolk, MD  Neurology    To contact Stroke Continuity provider, please refer to [email or web address]. After hours, contact General Neurology

## 2023-05-16 DIAGNOSIS — I10 Essential (primary) hypertension: Secondary | ICD-10-CM | POA: Diagnosis not present

## 2023-05-16 DIAGNOSIS — R509 Fever, unspecified: Secondary | ICD-10-CM | POA: Diagnosis not present

## 2023-05-16 DIAGNOSIS — E785 Hyperlipidemia, unspecified: Secondary | ICD-10-CM

## 2023-05-16 DIAGNOSIS — J9601 Acute respiratory failure with hypoxia: Secondary | ICD-10-CM | POA: Diagnosis not present

## 2023-05-16 DIAGNOSIS — I639 Cerebral infarction, unspecified: Secondary | ICD-10-CM | POA: Diagnosis not present

## 2023-05-16 LAB — BASIC METABOLIC PANEL
Anion gap: 11 (ref 5–15)
BUN: 30 mg/dL — ABNORMAL HIGH (ref 8–23)
CO2: 28 mmol/L (ref 22–32)
Calcium: 9.4 mg/dL (ref 8.9–10.3)
Chloride: 102 mmol/L (ref 98–111)
Creatinine, Ser: 0.93 mg/dL (ref 0.61–1.24)
GFR, Estimated: >60 mL/min (ref >60)
Glucose, Bld: 169 mg/dL — ABNORMAL HIGH (ref 70–99)
Potassium: 3.9 mmol/L (ref 3.5–5.1)
Sodium: 141 mmol/L (ref 135–145)

## 2023-05-16 LAB — CBC
HCT: 34.1 % — ABNORMAL LOW (ref 39.0–52.0)
Hemoglobin: 11.6 g/dL — ABNORMAL LOW (ref 13.0–17.0)
MCH: 28.2 pg (ref 26.0–34.0)
MCHC: 34 g/dL (ref 30.0–36.0)
MCV: 82.8 fL (ref 80.0–100.0)
Platelets: 258 10*3/uL (ref 150–400)
RBC: 4.12 MIL/uL — ABNORMAL LOW (ref 4.22–5.81)
RDW: 15 % (ref 11.5–15.5)
WBC: 8.3 10*3/uL (ref 4.0–10.5)
nRBC: 0 % (ref 0.0–0.2)

## 2023-05-16 LAB — GLUCOSE, CAPILLARY
Glucose-Capillary: 119 mg/dL — ABNORMAL HIGH (ref 70–99)
Glucose-Capillary: 126 mg/dL — ABNORMAL HIGH (ref 70–99)
Glucose-Capillary: 132 mg/dL — ABNORMAL HIGH (ref 70–99)
Glucose-Capillary: 139 mg/dL — ABNORMAL HIGH (ref 70–99)
Glucose-Capillary: 140 mg/dL — ABNORMAL HIGH (ref 70–99)
Glucose-Capillary: 148 mg/dL — ABNORMAL HIGH (ref 70–99)

## 2023-05-16 LAB — PHOSPHORUS: Phosphorus: 3.1 mg/dL (ref 2.5–4.6)

## 2023-05-16 MED ORDER — GUAIFENESIN 100 MG/5ML PO LIQD
15.0000 mL | Freq: Four times a day (QID) | ORAL | Status: DC
  Administered 2023-05-16 – 2023-06-19 (×131): 15 mL
  Filled 2023-05-16 (×7): qty 20
  Filled 2023-05-16: qty 15
  Filled 2023-05-16 (×4): qty 20
  Filled 2023-05-16 (×2): qty 15
  Filled 2023-05-16 (×12): qty 20
  Filled 2023-05-16 (×2): qty 15
  Filled 2023-05-16 (×4): qty 20
  Filled 2023-05-16: qty 15
  Filled 2023-05-16 (×10): qty 20
  Filled 2023-05-16 (×2): qty 15
  Filled 2023-05-16 (×2): qty 20
  Filled 2023-05-16 (×2): qty 15
  Filled 2023-05-16 (×3): qty 20
  Filled 2023-05-16 (×2): qty 15
  Filled 2023-05-16: qty 20
  Filled 2023-05-16: qty 15
  Filled 2023-05-16: qty 20
  Filled 2023-05-16: qty 15
  Filled 2023-05-16 (×3): qty 20
  Filled 2023-05-16: qty 15
  Filled 2023-05-16 (×2): qty 20
  Filled 2023-05-16: qty 15
  Filled 2023-05-16: qty 20
  Filled 2023-05-16: qty 15
  Filled 2023-05-16 (×2): qty 20
  Filled 2023-05-16 (×3): qty 15
  Filled 2023-05-16 (×2): qty 20
  Filled 2023-05-16: qty 15
  Filled 2023-05-16 (×4): qty 20
  Filled 2023-05-16 (×2): qty 15
  Filled 2023-05-16: qty 20
  Filled 2023-05-16: qty 15
  Filled 2023-05-16 (×6): qty 20
  Filled 2023-05-16 (×2): qty 15
  Filled 2023-05-16 (×3): qty 20
  Filled 2023-05-16: qty 15
  Filled 2023-05-16 (×2): qty 20
  Filled 2023-05-16: qty 15
  Filled 2023-05-16: qty 20
  Filled 2023-05-16 (×2): qty 15
  Filled 2023-05-16 (×3): qty 20
  Filled 2023-05-16: qty 15
  Filled 2023-05-16: qty 20
  Filled 2023-05-16 (×2): qty 15
  Filled 2023-05-16 (×3): qty 20
  Filled 2023-05-16: qty 15
  Filled 2023-05-16 (×2): qty 20
  Filled 2023-05-16: qty 15
  Filled 2023-05-16 (×2): qty 20
  Filled 2023-05-16: qty 15
  Filled 2023-05-16: qty 20
  Filled 2023-05-16: qty 15
  Filled 2023-05-16: qty 20
  Filled 2023-05-16: qty 15
  Filled 2023-05-16 (×4): qty 20
  Filled 2023-05-16: qty 15
  Filled 2023-05-16 (×2): qty 20

## 2023-05-16 MED ORDER — SODIUM CHLORIDE 0.9 % IV SOLN
2.0000 g | Freq: Two times a day (BID) | INTRAVENOUS | Status: DC
  Administered 2023-05-16 – 2023-05-19 (×8): 2 g via INTRAVENOUS
  Filled 2023-05-16 (×9): qty 12.5

## 2023-05-16 NOTE — Progress Notes (Signed)
 0800 patient alert to self place and year able to make needs known follows commands son at bedside updates on labs test and temp. Son concerned that patient wont get care if moved from ICU, education on levels of care given.

## 2023-05-16 NOTE — Progress Notes (Signed)
 STROKE TEAM PROGRESS NOTE   INTERIM HISTORY/SUBJECTIVE  Family at the bedside.   CCM started Cefepime due to previous Urine Cx was E. Coli resistant.    OBJECTIVE  CBC    Component Value Date/Time   WBC 8.3 05/16/2023 0528   RBC 4.12 (L) 05/16/2023 0528   HGB 11.6 (L) 05/16/2023 0528   HGB 13.3 02/03/2023 0852   HCT 34.1 (L) 05/16/2023 0528   HCT 39.9 02/03/2023 0852   PLT 258 05/16/2023 0528   PLT 271 02/03/2023 0852   MCV 82.8 05/16/2023 0528   MCV 87 02/03/2023 0852   MCH 28.2 05/16/2023 0528   MCHC 34.0 05/16/2023 0528   RDW 15.0 05/16/2023 0528   RDW 13.2 02/03/2023 0852   LYMPHSABS 2.8 05/11/2023 1344   LYMPHSABS 2.6 02/03/2023 0852   MONOABS 0.8 05/11/2023 1344   EOSABS 0.1 05/11/2023 1344   EOSABS 0.3 02/03/2023 0852   BASOSABS 0.0 05/11/2023 1344   BASOSABS 0.1 02/03/2023 0852    BMET    Component Value Date/Time   NA 141 05/16/2023 0528   NA 143 02/03/2023 0852   K 3.9 05/16/2023 0528   CL 102 05/16/2023 0528   CO2 28 05/16/2023 0528   GLUCOSE 169 (H) 05/16/2023 0528   BUN 30 (H) 05/16/2023 0528   BUN 15 02/03/2023 0852   CREATININE 0.93 05/16/2023 0528   CALCIUM 9.4 05/16/2023 0528   EGFR 76 02/03/2023 0852   GFRNONAA >60 05/16/2023 0528    IMAGING past 24 hours No results found.   Vitals:   05/16/23 0900 05/16/23 1000 05/16/23 1100 05/16/23 1124  BP: 134/73 (!) 147/75 (!) 150/74   Pulse: 91 86 84 96  Resp: (!) 26 (!) 23 (!) 26 20  Temp:    97.9 F (36.6 C)  TempSrc:    Oral  SpO2: 99% 100% 100% 95%  Weight:         PHYSICAL EXAM General:  Alert, well-nourished, well-developed patient in no acute distress Psych:  Mood and affect appropriate for situation CV: Regular rate and rhythm on monitor Respiratory:  Regular, unlabored respirations on room air GI: Abdomen soft and nontender  NEURO:  Mental Status: awake.  Oriented to name. Tells me he is at [facility]. Derek Yates is 20[birth year]. Didn't know month. He thought his son was a friend.      Follows commands. Sticks tongue out. Showed me thumbs up Speech/Language: Hypophonic speech speech is without dysarthria or aphasia.  Naming, repetition, fluency, and comprehension intact.  Cranial Nerves:  II: PERRL.  Intermittent blink to threat on left III, IV, VI: EOMI. Eyelids elevate symmetrically.  V: Sensation is intact to light touch and symmetrical to face.  VII: Left facial droop VIII: hearing intact to voice. IX, X: Palate elevates symmetrically. Phonation is normal.  JY:[id number] shrug 5/5. XII: tongue is midline without fasciculations. Quiet voice.  Motor: Left arm weakness 3/5, bilateral lower extremities not antigravity, can wiggle toes Tone: is normal and bulk is normal Sensation- Intact to light touch bilaterally. Extinction absent to light touch to DSS.   Coordination: FTN intact bilaterally, HKS: no ataxia in BLE.No drift.  Gait- deferred     ASSESSMENT/PLAN  [name] is a 83 y.o. male with history of hypertension, CVA, recurrent UTIs, dementia who presented to [facility] as a code stroke due to speech deficits, left-sided deficits.  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 Intraventricular penetration with the amount of intraventricular blood being similar. CTA not pursued as it will not change mangement MRI ICH centered at the right caudate head with IVH and mild communicating hydrocephalus. 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 Old left frontal, left occipital and right  parieto-occipital infarcts. CT repeat  2/21: Roughly 6 mL Right basal ganglia hemorrhage has increased from approximately 4 mL at presentation. Stable moderate volume of IVH. No acute ventriculomegaly.  Mild leftward shift of 3 mm stable CT head repeat 2/22 stable 2D Echo: LVEF 65 to 70%  LDL 69 HgbA1c 5.7 VTE prophylaxis - SCDs No antithrombotic prior to admission, now on No antithrombotic due to ICH and CAA Therapy recommendations: CIR Disposition pending  Advanced CAA MRI showed advanced CAA No antithrombotics Strict BP control less than 140 Recommend no statin at this time  Hypertension Home meds:  nifedipine 30mg  Stable BP goal less than 140 given CAA On amlodipine 10 Strict BP control at home  Hyperlipidemia Home meds:  lipitor 10mg  LDL 66, goal < 70 Recommend no statin given advanced CAA  Dysphagia Patient has post-stroke dysphagia SLP consulted Now n.p.o. Core track placed On tube feeds  Dementia Severe memory loss at baseline Able to walk with assistance at home Home meds including Aricept  Other Stroke Risk Factors Advanced Age  Other Active Problems Recurrent UTIs, Leukocytosis-WBC 11.0, afebrile-will monitor AKI, Cr 1.40-1.24>0.93 Intermittent cough, Supplemental Oxygen needed CXR:  Mild pulmonary vascular congestion No evidence of pneumonia Continue 3L [place], wean as able  fever on 2/22, repeat CXR 2/22 CCM consulted Started on Cefepime.   Hospital day # 5  More wake and oriented than yesterday. Follows commands. Still a little confused but improving.  On abx for PNA. Transfer out of ICU. Cr improved.    This patient is critically ill due to ICH with IVH and at significant risk of neurological worsening, death form heart failure, respiratory failure, recurrent stroke, bleeding from [facility], seizure, sepsis. This patient's care requires constant monitoring of vital signs, hemodynamics, respiratory and cardiac monitoring, review of multiple databases,  neurological assessment, discussion with family, other specialists and medical decision making of high complexity. I spent 35 minutes of neurocritical care time in the care of this patient.   Derek Mersch,MD   To contact Stroke Continuity provider, please refer to [email or web address]. After hours, contact General Neurology

## 2023-05-16 NOTE — Plan of Care (Signed)
  Problem: Education: Goal: Knowledge of General Education information will improve Description: Including pain rating scale, medication(s)/side effects and non-pharmacologic comfort measures Outcome: Not Progressing   Problem: Health Behavior/Discharge Planning: Goal: Ability to manage health-related needs will improve Outcome: Not Progressing   Problem: Clinical Measurements: Goal: Ability to maintain clinical measurements within normal limits will improve Outcome: Not Progressing Goal: Will remain free from infection Outcome: Not Progressing Goal: Diagnostic test results will improve Outcome: Not Progressing Goal: Respiratory complications will improve Outcome: Not Progressing Goal: Cardiovascular complication will be avoided Outcome: Not Progressing   Problem: Activity: Goal: Risk for activity intolerance will decrease Outcome: Progressing   Problem: Nutrition: Goal: Adequate nutrition will be maintained Outcome: Not Progressing   Problem: Coping: Goal: Level of anxiety will decrease Outcome: Not Progressing   Problem: Elimination: Goal: Will not experience complications related to bowel motility Outcome: Not Progressing Goal: Will not experience complications related to urinary retention Outcome: Not Progressing   Problem: Pain Managment: Goal: General experience of comfort will improve and/or be controlled Outcome: Not Progressing   Problem: Safety: Goal: Ability to remain free from injury will improve Outcome: Not Progressing   Problem: Skin Integrity: Goal: Risk for impaired skin integrity will decrease Outcome: Not Progressing   Problem: Education: Goal: Knowledge of disease or condition will improve Outcome: Not Progressing Goal: Knowledge of secondary prevention will improve (MUST DOCUMENT ALL) Outcome: Not Progressing Goal: Knowledge of patient specific risk factors will improve (DELETE if not current risk factor) Outcome: Not Progressing    Problem: Intracerebral Hemorrhage Tissue Perfusion: Goal: Complications of Intracerebral Hemorrhage will be minimized Outcome: Not Progressing   Problem: Coping: Goal: Will verbalize positive feelings about self Outcome: Not Progressing Goal: Will identify appropriate support needs Outcome: Not Progressing   Problem: Self-Care: Goal: Ability to participate in self-care as condition permits will improve Outcome: Not Progressing Goal: Verbalization of feelings and concerns over difficulty with self-care will improve Outcome: Not Progressing Goal: Ability to communicate needs accurately will improve Outcome: Not Progressing   Problem: Nutrition: Goal: Risk of aspiration will decrease Outcome: Not Progressing Goal: Dietary intake will improve Outcome: Not Progressing   Problem: Education: Goal: Ability to describe self-care measures that may prevent or decrease complications (Diabetes Survival Skills Education) will improve Outcome: Not Progressing Goal: Individualized Educational Video(s) Outcome: Not Progressing   Problem: Coping: Goal: Ability to adjust to condition or change in health will improve Outcome: Not Progressing   Problem: Fluid Volume: Goal: Ability to maintain a balanced intake and output will improve Outcome: Not Progressing   Problem: Health Behavior/Discharge Planning: Goal: Ability to identify and utilize available resources and services will improve Outcome: Not Progressing Goal: Ability to manage health-related needs will improve Outcome: Not Progressing   Problem: Metabolic: Goal: Ability to maintain appropriate glucose levels will improve Outcome: Not Progressing   Problem: Nutritional: Goal: Maintenance of adequate nutrition will improve Outcome: Not Progressing Goal: Progress toward achieving an optimal weight will improve Outcome: Not Progressing   Problem: Skin Integrity: Goal: Risk for impaired skin integrity will decrease Outcome:  Not Progressing   Problem: Tissue Perfusion: Goal: Adequacy of tissue perfusion will improve Outcome: Not Progressing

## 2023-05-16 NOTE — TOC Initial Note (Signed)
 Transition of Care [facility]) - Initial/Assessment Note    Patient Details  Name: Derek Yates MRN: [id number] Date of Birth: [birth year]  Transition of Care [facility]) CM/SW Contact:    Jessie Foot, RN Phone Number: 05/16/2023, 3:59 PM  Clinical Narrative:                 Presented with dysphagia from right basal ganglia ICH with difficulty speaking and left sided weakness. PTA wife helping with walking since trip [place] x2 weeks ago,Prior to this pt was indep. Patient has DME (rolling walker & shower chair). Patients son Beckey Downing) at bedside and agreeable to SNF placement at discharge. Concerns for SNF meeting  religious dietary needs.[name] son is requesting SNF in [facility]. Case Manager will continue to follow as he progresses.   Expected Discharge Plan: Skilled Nursing Facility Barriers to Discharge: Continued Medical Work up, SNF Pending bed offer, Insurance Authorization   Patient Goals and CMS Choice Patient states their goals for this hospitalization and ongoing recovery are:: home with wife          Expected Discharge Plan and Services   Discharge Planning Services: CM Consult Post Acute Care Choice: Skilled Nursing Facility Living arrangements for the past 2 months: Single Family Home                                      Prior Living Arrangements/Services Living arrangements for the past 2 months: Single Family Home Lives with:: Spouse Patient language and need for interpreter reviewed:: Yes Do you feel safe going back to the place where you live?: Yes      Need for Family Participation in Patient Care: Yes (Comment) (Spouse & Son) Care giver support system in place?: Yes (comment)   Criminal Activity/Legal Involvement Pertinent to Current Situation/Hospitalization: No - Comment as needed  Activities of Daily Living      Permission Sought/Granted Permission sought to share information with : Case Manager, Family Supports Permission granted  to share information with : Yes, Verbal Permission Granted  Share Information with NAME: Producer, television/film/video     Permission granted to share info w Relationship: son  Permission granted to share info w Contact Information: [phone]  Emotional Assessment Appearance:: Appears stated age Attitude/Demeanor/Rapport: Guarded Affect (typically observed): Appropriate Orientation: : Oriented to Self, Oriented to Place Alcohol / Substance Use: Not Applicable Psych Involvement: No (comment)  Admission diagnosis:  Stroke (cerebrum) (HCC) [I63.9] Patient Active Problem List   Diagnosis Date Noted   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) 05/15/2023   Protein-calorie malnutrition, severe 05/13/2023   Stroke (cerebrum) (HCC) 05/11/2023   Acute cough 04/21/2023   Other abnormal findings in urine 04/21/2023   Mild dementia without behavioral disturbance, psychotic disturbance, mood disturbance, or anxiety (HCC) 02/14/2023   Onychomycosis 08/17/2022   Gait abnormality 08/17/2022   Mixed hyperlipidemia 06/01/2022   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 (HCC) 06/01/2022   PCP:  [name], MD Pharmacy:   [id number] - [place], [place] - [address] AT [facility] [address] [place] [address] Phone: [phone] Fax: [phone]  [facility] - [place], [place] - [address] [place] [address] Phone: [phone] Fax: [phone]     Social Drivers of Health (SDOH) Social History: SDOH  Screenings   Food Insecurity: Patient Unable To Answer (05/13/2023)  Housing: Patient Unable To Answer (05/13/2023)  Transportation Needs: Patient Unable To Answer (05/13/2023)  Utilities: Patient Unable To Answer (05/13/2023)  Depression (PHQ2-9): Medium Risk (02/10/2023)  Financial Resource Strain: Low Risk  (06/20/2021)   Received  from [facility]  Social Connections: Patient Unable To Answer (05/13/2023)  Tobacco Use: Low Risk  (05/04/2023)   SDOH Interventions:     Readmission Risk Interventions     No data to display

## 2023-05-16 NOTE — TOC CM/SW Note (Signed)
 Transition of Care [facility]) - Inpatient Brief Assessment   Patient Details  Name: Derek Yates MRN: [id number] Date of Birth: 04-09-1940  Transition of Care [facility]) CM/SW Contact:    Mearl Latin, LCSW Phone Number: 05/16/2023, 10:18 AM   Clinical Narrative: CSW continuing to follow. Patient remains with [facility]rak.    Transition of Care Asessment: Insurance and Status: Insurance coverage has been reviewed Patient has primary care physician: Yes Home environment has been reviewed: From home Prior level of function:: Independent Prior/Current Home Services: No current home services Social Drivers of Health Review: SDOH reviewed no interventions necessary Readmission risk has been reviewed: Yes Transition of care needs: transition of care needs identified, TOC will continue to follow

## 2023-05-16 NOTE — Progress Notes (Signed)
   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 Presented 2/19 with difficulty speaking and with left sided weakness.  ICH score 2. Currently NPO and receiving Cortrak feeds.  Has been lethargic since admission.   Increasing fevers in the past 48h with spike to 39.5C today. More lethargic.   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  Early dementia.   Significant Hospital Events: Including procedures, antibiotic start and stop dates in addition to other pertinent events   2.19 - admitted with right caudate bleed. ICH score 2 2/24 abx changed from CTX to Cefepime due to prior urine culture E.coli resistant to CTX (2023)  Interim History / Subjective:  Somnolent, globally weak. Weak cough.  Objective   Blood pressure 134/73, pulse 91, temperature 99 F (37.2 C), temperature source Axillary, resp. rate (!) 26, weight 59.4 kg, SpO2 99%.        Intake/Output Summary (Last 24 hours) at 05/16/2023 1010 Last data filed at 05/16/2023 0900 Gross per 24 hour  Intake 1208.6 ml  Output 920 ml  Net 288.6 ml   Filed Weights   05/14/23 0436 05/15/23 0441 05/16/23 0403  Weight: 60.9 kg 61.8 kg 59.4 kg    Examination: General: Elderly male, resting in bed, in NAD. Neuro: Awake, weak, follows commands, mouths words. HEENT: [place]/AT. Sclerae anicteric. EOMI. Cardiovascular: RRR, no M/R/G.  Lungs: Respirations even and unlabored.  CTA bilaterally, No W/R/R. Weak and wet cough. Abdomen: BS x 4, soft, NT/ND.  Musculoskeletal: No gross deformities, no edema.  Skin: Intact, warm, no rashes.  Ancillary Tests Personally Reviewed:   Mild leukocytosis.  Acceptable glycemic control.  Assessment and Plan:   Fever likely to due to recurrent silent  aspiration from dysphagia/dysarthria from ICH. Right caudate ICH - could be contributing slightly to fever Advanced cerebral amyloid angiopathy. Possible dementia. - Neuro following/managing.  HTN, HLD - Continue Amlodipine. - No statin per neuro 2/2 advanced CAA  Acute hypoxic respiratory failure - 2/2 possible aspiration PNA - no hx but does have weak wet cough and failed swallow eval. - Continue supplemental O2 as needed to maintain SpO2 > 92%. - Change CTX to Cefepime through 3/1. - Add chest PT, guaifenesin. - Aspiration precautions. - Mobilize when able.  Prior UTI - has had E.coli resistant to CTX in 2023. - Change CTX to Cefepime through 3/1.  Dysphagia. - NPO. - TF's per cortrak.   Best Practice (right click and "Reselect all SmartList Selections" daily)   Diet/type: tubefeeds DVT prophylaxis prophylactic heparin  Pressure ulcer(s): N/A GI prophylaxis: N/A Lines: N/A Foley:  N/A Code Status:  full code Last date of multidisciplinary goals of care discussion [per primary team]    Rutherford Guys, PA - C [facility] For pager details, please see AMION or use Epic chat  After 1900, please call [facility] for cross coverage needs 05/16/2023, 10:33 AM

## 2023-05-16 NOTE — Progress Notes (Addendum)
 Speech Language Pathology Treatment: Dysphagia  Patient Details Name: Derek Yates MRN: [id number] DOB: 1941-03-08 Today's Date: 05/16/2023 Time: 7829-5621 SLP Time Calculation (min) (ACUTE ONLY): 18 min  Assessment / Plan / Recommendation Clinical Impression  RN reports improvement in level of alertness compared to previous date, although pt continues to be lethargic and requires Mod multimodal cueing to sustain attention and alertness. At rest, pt has audible secretions that cause coughing but pt is unable to expectorate them orally given cueing. He immediately coughs following small sips of water, which is suspected to aid in secretion management although did not improve ability to expectorate this date. Pt swallows multiple times with bites of puree and has a delayed cough. Education provided to pt's son regarding plan. Pt's mentation remains a significant barrier to PO intake. Recommend he remain NPO with AMN in place for medication administration. SLP will continue following.    HPI HPI: Patient is an 83 y.o. male with PMH: dementia, recurrent UTI's, CVA, HTN. He presented to the hospital on 05/11/23 as a code stroke with speech deficits and left sided weakness. 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 Patient has been NPO; Cortrak placed 2/21. He failed [facility] swallow screen with RN.      SLP Plan  Continue with current plan of care      Recommendations for follow up therapy are one component of a multi-disciplinary discharge planning process, led by the attending physician.  Recommendations may be updated based on patient status, additional functional criteria and insurance authorization.    Recommendations  Diet recommendations: NPO Medication Administration: Via alternative means                  Oral care QID   Frequent or constant  Supervision/Assistance Dysphagia, unspecified (R13.10)     Continue with current plan of care     Derek Yates, M.A., CF-SLP Speech Language Pathology, Acute Rehabilitation Services  Secure Chat preferred [phone]   05/16/2023, 11:14 AM

## 2023-05-16 NOTE — Progress Notes (Signed)
 Occupational Therapy Treatment Patient Details Name: Derek Yates MRN: [id number] DOB: [birth year] Today's Date: 05/16/2023   History of present illness 83 yo male presents to [facility] on 2/19 as code stroke for speech deficits, L weakness. 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   OT comments  Patient with incremental progress toward patient focused goals.  Patient able to sit EOB with less assist, able to stand with Mod to Max A, but unable to take steps to recliner.  Total A for grooming attempts at recliner level.  OT to continue efforts in the acute setting and Patient will benefit from continued inpatient follow up therapy, <3 hours/day.        If plan is discharge home, recommend the following:  Assist for transportation;A lot of help with bathing/dressing/bathroom;Two people to help with walking and/or transfers   Equipment Recommendations       Recommendations for Other Services      Precautions / Restrictions Precautions Precautions: Fall Recall of Precautions/Restrictions: Impaired Precaution/Restrictions Comments: Rectal tube, condom cath, NG Feeding tube Restrictions Weight Bearing Restrictions Per Provider Order: No       Mobility Bed Mobility   Bed Mobility: Rolling Rolling: Total assist   Supine to sit: Total assist, HOB elevated       Patient Response: Cooperative  Transfers Overall transfer level: Needs assistance Equipment used: 1 person hand held assist Transfers: Sit to/from Stand, Bed to chair/wheelchair/BSC Sit to Stand: Max assist Stand pivot transfers: Total assist         General transfer comment: patient was able to support his weight instanding with Mod A, no knee buckle, but unable to weight shift or take pivital steps.     Balance Overall balance assessment: Needs assistance Sitting-balance support: Feet supported, Bilateral upper extremity  supported Sitting balance-Leahy Scale: Poor   Postural control: Left lateral lean Standing balance support: Bilateral upper extremity supported Standing balance-Leahy Scale: Poor                             ADL either performed or assessed with clinical judgement   ADL   Eating/Feeding: NPO   Grooming: Total assistance;Sitting           Upper Body Dressing : Total assistance;Sitting   Lower Body Dressing: Total assistance;Bed level       Toileting- Clothing Manipulation and Hygiene: Total assistance;Bed level              Extremity/Trunk Assessment Upper Extremity Assessment RUE Deficits / Details: No pruposeful movements to command, but actively resists bed mobility.  Continue to assess. RUE Coordination: decreased fine motor;decreased gross motor LUE Deficits / Details: No pruposeful movements to command, but actively resists bed mobility, very tense rigid during transitional movements. LUE Coordination: decreased fine motor;decreased gross motor   Lower Extremity Assessment Lower Extremity Assessment: Defer to PT evaluation   Cervical / Trunk Assessment Cervical / Trunk Assessment: Kyphotic    Vision   Vision Assessment?: Vision impaired- to be further tested in functional context   Perception Perception Perception: Not tested   Praxis Praxis Praxis: Not tested   Communication Communication Communication: Impaired Factors Affecting Communication: Difficulty expressing self;Reduced clarity of speech   Cognition Arousal: Lethargic Behavior During Therapy: Flat affect Cognition: Difficult to assess Difficult to assess due to: Level of arousal  Following commands: Impaired        Cueing   Cueing Techniques: Verbal cues, Tactile cues  Exercises      Shoulder Instructions       General Comments      Pertinent Vitals/ Pain       Pain Assessment Pain Assessment: PAINAD Breathing:  normal Negative Vocalization: occasional moan/groan, low speech, negative/disapproving quality Facial Expression: smiling or inexpressive Body Language: tense, distressed pacing, fidgeting Consolability: no need to console PAINAD Score: 2 Pain Location: generalized Pain Intervention(s): Monitored during session                                                          Frequency  Min 1X/week        Progress Toward Goals  OT Goals(current goals can now be found in the care plan section)  Progress towards OT goals: Progressing toward goals  Acute Rehab OT Goals OT Goal Formulation: Patient unable to participate in goal setting Time For Goal Achievement: 05/27/23 Potential to Achieve Goals: Fair  Plan      Co-evaluation                 AM-PAC OT "6 Clicks" Daily Activity     Outcome Measure   Help from another person eating meals?: Total Help from another person taking care of personal grooming?: Total Help from another person toileting, which includes using toliet, bedpan, or urinal?: Total Help from another person bathing (including washing, rinsing, drying)?: Total Help from another person to put on and taking off regular upper body clothing?: Total Help from another person to put on and taking off regular lower body clothing?: Total 6 Click Score: 6    End of Session    OT Visit Diagnosis: Unsteadiness on feet (R26.81);Other symptoms and signs involving cognitive function   Activity Tolerance Patient limited by lethargy   Patient Left in chair;with call bell/phone within reach;with chair alarm set   Nurse Communication Mobility status        Time: 4540-9811 OT Time Calculation (min): 22 min  Charges: OT General Charges $OT Visit: 1 Visit OT Treatments $Self Care/Home Management : 8-22 mins  05/16/2023  RP, OTR/L  Acute Rehabilitation Services  Office:  [phone]   Suzanna Obey 05/16/2023, 11:39 AM

## 2023-05-17 ENCOUNTER — Inpatient Hospital Stay (HOSPITAL_COMMUNITY): Payer: Medicare Other

## 2023-05-17 DIAGNOSIS — I6389 Other cerebral infarction: Secondary | ICD-10-CM | POA: Diagnosis not present

## 2023-05-17 DIAGNOSIS — I639 Cerebral infarction, unspecified: Secondary | ICD-10-CM | POA: Diagnosis not present

## 2023-05-17 LAB — CBC
HCT: 33.9 % — ABNORMAL LOW (ref 39.0–52.0)
Hemoglobin: 11.7 g/dL — ABNORMAL LOW (ref 13.0–17.0)
MCH: 28.3 pg (ref 26.0–34.0)
MCHC: 34.5 g/dL (ref 30.0–36.0)
MCV: 82.1 fL (ref 80.0–100.0)
Platelets: 273 10*3/uL (ref 150–400)
RBC: 4.13 MIL/uL — ABNORMAL LOW (ref 4.22–5.81)
RDW: 15.2 % (ref 11.5–15.5)
WBC: 8.7 10*3/uL (ref 4.0–10.5)
nRBC: 0 % (ref 0.0–0.2)

## 2023-05-17 LAB — GLUCOSE, CAPILLARY
Glucose-Capillary: 126 mg/dL — ABNORMAL HIGH (ref 70–99)
Glucose-Capillary: 129 mg/dL — ABNORMAL HIGH (ref 70–99)
Glucose-Capillary: 138 mg/dL — ABNORMAL HIGH (ref 70–99)
Glucose-Capillary: 138 mg/dL — ABNORMAL HIGH (ref 70–99)
Glucose-Capillary: 161 mg/dL — ABNORMAL HIGH (ref 70–99)
Glucose-Capillary: 171 mg/dL — ABNORMAL HIGH (ref 70–99)

## 2023-05-17 LAB — MRSA NEXT GEN BY PCR, NASAL: MRSA by PCR Next Gen: NOT DETECTED

## 2023-05-17 LAB — URINALYSIS, ROUTINE W REFLEX MICROSCOPIC
Bilirubin Urine: NEGATIVE
Glucose, UA: NEGATIVE mg/dL
Hgb urine dipstick: NEGATIVE
Ketones, ur: NEGATIVE mg/dL
Leukocytes,Ua: NEGATIVE
Nitrite: NEGATIVE
Protein, ur: NEGATIVE mg/dL
Specific Gravity, Urine: 1.026 (ref 1.005–1.030)
pH: 5 (ref 5.0–8.0)

## 2023-05-17 LAB — EXPECTORATED SPUTUM ASSESSMENT W GRAM STAIN, RFLX TO RESP C

## 2023-05-17 LAB — BASIC METABOLIC PANEL
Anion gap: 5 (ref 5–15)
BUN: 36 mg/dL — ABNORMAL HIGH (ref 8–23)
CO2: 29 mmol/L (ref 22–32)
Calcium: 9.2 mg/dL (ref 8.9–10.3)
Chloride: 108 mmol/L (ref 98–111)
Creatinine, Ser: 0.8 mg/dL (ref 0.61–1.24)
GFR, Estimated: >60 mL/min (ref >60)
Glucose, Bld: 148 mg/dL — ABNORMAL HIGH (ref 70–99)
Potassium: 4.1 mmol/L (ref 3.5–5.1)
Sodium: 142 mmol/L (ref 135–145)

## 2023-05-17 MED ORDER — BROMOCRIPTINE MESYLATE 2.5 MG PO TABS
1.2500 mg | ORAL_TABLET | Freq: Four times a day (QID) | ORAL | Status: DC
  Administered 2023-05-17 – 2023-05-19 (×11): 1.25 mg
  Filled 2023-05-17 (×12): qty 1

## 2023-05-17 MED ORDER — LINEZOLID 600 MG/300ML IV SOLN: 600.0000 mg | Freq: Two times a day (BID) | INTRAVENOUS | Status: DC

## 2023-05-17 MED ORDER — METRONIDAZOLE 500 MG/100ML IV SOLN
500.0000 mg | Freq: Two times a day (BID) | INTRAVENOUS | Status: DC
  Administered 2023-05-17 – 2023-05-20 (×6): 500 mg via INTRAVENOUS
  Filled 2023-05-17 (×8): qty 100

## 2023-05-17 MED ORDER — SODIUM CHLORIDE 0.45 % IV SOLN: INTRAVENOUS | Status: AC

## 2023-05-17 MED ORDER — BANATROL TF EN LIQD
60.0000 mL | Freq: Two times a day (BID) | ENTERAL | Status: DC
  Administered 2023-05-17 – 2023-06-20 (×68): 60 mL
  Filled 2023-05-17 (×66): qty 60

## 2023-05-17 NOTE — Progress Notes (Signed)
 STROKE TEAM PROGRESS NOTE   INTERIM HISTORY/SUBJECTIVE  Family at the bedside.  Fever yesterday afternoon and another fever spike 102 at [place]. Held transfer out.  Tachycardic.   On Cefepime due to previous Urine Cx was E. Coli resistant.   Long discussion with pt's son.   OBJECTIVE  CBC    Component Value Date/Time   WBC 8.7 05/17/2023 0455   RBC 4.13 (L) 05/17/2023 0455   HGB 11.7 (L) 05/17/2023 0455   HGB 13.3 02/03/2023 0852   HCT 33.9 (L) 05/17/2023 0455   HCT 39.9 02/03/2023 0852   PLT 273 05/17/2023 0455   PLT 271 02/03/2023 0852   MCV 82.1 05/17/2023 0455   MCV 87 02/03/2023 0852   MCH 28.3 05/17/2023 0455   MCHC 34.5 05/17/2023 0455   RDW 15.2 05/17/2023 0455   RDW 13.2 02/03/2023 0852   LYMPHSABS 2.8 05/11/2023 1344   LYMPHSABS 2.6 02/03/2023 0852   MONOABS 0.8 05/11/2023 1344   EOSABS 0.1 05/11/2023 1344   EOSABS 0.3 02/03/2023 0852   BASOSABS 0.0 05/11/2023 1344   BASOSABS 0.1 02/03/2023 0852    BMET    Component Value Date/Time   NA 142 05/17/2023 0455   NA 143 02/03/2023 0852   K 4.1 05/17/2023 0455   CL 108 05/17/2023 0455   CO2 29 05/17/2023 0455   GLUCOSE 148 (H) 05/17/2023 0455   BUN 36 (H) 05/17/2023 0455   BUN 15 02/03/2023 0852   CREATININE 0.80 05/17/2023 0455   CALCIUM 9.2 05/17/2023 0455   EGFR 76 02/03/2023 0852   GFRNONAA >60 05/17/2023 0455    IMAGING past 24 hours No results found.   Vitals:   05/17/23 0600 05/17/23 0700 05/17/23 0729 05/17/23 0800  BP: (!) 173/87 (!) 157/85  (!) 158/90  Pulse: (!) 113 (!) 113 (!) 104 (!) 121  Resp: (!) 23 18 (!) 25 (!) 24  Temp:   99.1 F (37.3 C) 99.1 F (37.3 C)  TempSrc:   Axillary Axillary  SpO2: 95% 97% 97% 96%  Weight:         PHYSICAL EXAM General:  Alert, well-nourished, well-developed patient in no acute distress Psych:  Mood and affect appropriate for situation CV: Regular rate and rhythm on monitor Respiratory:  Regular, unlabored respirations on room air. Mild  rhonchi on right side.  NEURO:  Mental Status: awake.  Oriented to name. Tells me he is the hospital. Year is 2025.   Follows commands. Sticks tongue out. Showed me thumbs up Speech/Language: Hypophonic speech speech is without dysarthria or aphasia.  Naming, repetition, fluency, and comprehension intact.  Cranial Nerves:  II: PERRL.  Intermittent blink to threat on left III, IV, VI: EOMI. Eyelids elevate symmetrically.  V: Sensation is intact to light touch and symmetrical to face.  VII: Left facial droop VIII: hearing intact to voice. IX, X: Palate elevates symmetrically. Phonation is normal.  ZO:[id number] shrug 5/5. XII: tongue is midline without fasciculations. Quiet voice.  Motor: Left arm weakness 3/5, bilateral lower extremities not antigravity, can wiggle toes Tone: is normal and bulk is normal Sensation- Intact to light touch bilaterally. Extinction absent to light touch to DSS.   Coordination: FTN intact bilaterally, HKS: no ataxia in BLE.No drift.  Gait- deferred     ASSESSMENT/PLAN  Derek Yates is a 83 y.o. male with history of hypertension, CVA, recurrent UTIs, dementia who presented to [facility] as a code stroke due to speech deficits, left-sided deficits.  On arrival he was taken for  a head CT which demonstrated intraparenchymal hemorrhage in the right caudate with extensive intraventricular extension.  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 Intraventricular penetration with the amount of intraventricular blood being similar. CTA not pursued as it will not change mangement MRI ICH centered at the right caudate head with IVH and mild communicating hydrocephalus. Numerous chronic microhemorrhages in a predominantly peripheral  distribution, which may be due to cerebral amyloid angiopathy. Old left frontal, left occipital and right parieto-occipital infarcts. CT repeat  2/21: Roughly 6 mL Right basal ganglia hemorrhage has increased from approximately 4 mL at presentation. Stable moderate volume of IVH. No acute ventriculomegaly.  Mild leftward shift of 3 mm stable CT head repeat 2/22 stable 2D Echo: LVEF 65 to 70%  LDL 69 HgbA1c 5.7 VTE prophylaxis - SCDs No antithrombotic prior to admission, now on No antithrombotic due to ICH and CAA Therapy recommendations: CIR Disposition pending  Advanced CAA MRI showed advanced CAA No antithrombotics Strict BP control less than 140 Recommend no statin at this time  Hypertension Home meds:  nifedipine 30mg  Stable BP goal less than 140 given CAA On amlodipine 10 Strict BP control at home  Hyperlipidemia Home meds:  lipitor 10mg  LDL 66, goal < 70 Recommend no statin given advanced CAA  Dysphagia Patient has post-stroke dysphagia SLP consulted Now n.p.o. Core track placed On tube feeds  Dementia Severe memory loss at baseline Able to walk with assistance at home Home meds including Aricept  Other Stroke Risk Factors Advanced Age  Other Active Problems Recurrent UTIs, Leukocytosis-WBC 11.0, afebrile-will monitor AKI, Cr 1.40-1.24>0.93 Intermittent cough, Supplemental Oxygen needed CXR:  Mild pulmonary vascular congestion No evidence of pneumonia Continue 3L [place], wean as able  fever on 2/22, repeat CXR 2/22 CCM consulted Started on Cefepime.   Hospital day # 6  Follows commands. Still a little confused but improving.  On abx for PNA.  Discussed with Dr. Denese Killings. Con't abx. Add bromocriptine.   Long discussion with pt's son on code status. He wants DNR/DNI. Consult pallative.    This patient is critically ill due to ICH with IVH, PNA and at significant risk of neurological worsening, death form heart failure, respiratory failure, recurrent  stroke, bleeding from [facility], seizure, sepsis. This patient's care requires constant monitoring of vital signs, hemodynamics, respiratory and cardiac monitoring, review of multiple databases, neurological assessment, discussion with family, other specialists and medical decision making of high complexity. I spent 40 minutes of neurocritical care time in the care of this patient.   Lexis Potenza,MD   To contact Stroke Continuity provider, please refer to [email or web address]. After hours, contact General Neurology

## 2023-05-17 NOTE — Progress Notes (Signed)
 PROGRESS NOTE    Derek Yates  UJW:[id number] DOB: [birth year] DOA: 05/11/2023 PCP: [name], MD   Brief Narrative: 83 year old with past medical history significant for hypertension, CVA, recurrent UTI, dementia presented to [facility] as a code stroke due to speech deficits, left-sided deficits.  On arrival he was taken for a CT head which demonstrated intraparenchymal hemorrhage in the right caudate with extensive intra ventricular extension.  Admitted 2/19.  CCM consulted on 2/23 due to fevers for the last 48 hours.  Patient has been more lethargic as well.  Fever thought to be related to aspiration  PNA.  Patient care transferred from CCM to triad 12/25.  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  CT Head Showed head hemorrhage with intraventricular extension. -MRI: ICH centered at the right [place] at the head with IVH and mild communicating hydrocephalus.  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  Old left frontal and left occipital and right parietal occipital infarcts. -Repeated CT 12/21st: Roughly 6 mL right basal ganglia hemorrhage has increased from approximately 4 mL presentation.  Stable moderate volume IVH.  3 mm leftward shift -Avoid antithrombotic due to ICH. -Neurology following  Fevers, Aspiration PNA;  Obtain CT chest finding consistent with aspiration PNA> Continue with cefepime. Will add flagyl. Repeat Blood culture if spike fever again. Sputum culture, UA, urine culture. Prior E coli resistant.  Palliative care consulted.  Advanced CAA: -MRI showed advanced CAA  -Blood  pressure control -Neurology does not recommend statin at this time   Hypertension: -Continue with Norvasc  Hyperlipidemia: LDL 66, neurology recommended no a statin given advanced CAA  Dementia; -Severe memory loss at baseline. -Palliative care has been consulted  AKI: Patient has possible stroke dysphagia Continue with core track Palliative care consulted   Nutrition Problem: Severe Malnutrition Etiology: chronic illness    Signs/Symptoms: severe fat depletion, severe muscle depletion    Interventions: Tube feeding, MVI, Prostat  Estimated body mass index is 19.71 kg/m as calculated from the following:   Height as of an earlier encounter on 05/11/23: 5\' 8"  (1.727 m).   Weight as of this encounter: 58.8 kg.   DVT prophylaxis: Started on heparin by neurology Code Status: DNR Family Communication: Son who was at bedside Disposition Plan:  Status is: Inpatient Remains inpatient appropriate because: Management of ICH    Consultants:  Neurology CCM    Antimicrobials:  Cefepime  Subjective: Patient is alert, saying a few words but unable to understand well He has been coughing  Objective: Vitals:   05/17/23 0200 05/17/23 0300 05/17/23 0400 05/17/23 0500  BP: (!) 111/52 125/63 (!) 145/70   Pulse: 84 76 86   Resp: (!) 23 20 (!) 23   Temp:   99.1 F (37.3 C)   TempSrc:   Axillary   SpO2: 90% (!) 89% 95%   Weight:    58.8 kg    Intake/Output Summary (Last 24 hours) at 05/17/2023 0715 Last data filed at 05/17/2023 0600 Gross per 24 hour  Intake 1375.3 ml  Output 905 ml  Net 470.3 ml   Filed Weights   05/15/23 0441 05/16/23 0403 05/17/23 0500  Weight: 61.8 kg 59.4  kg 58.8 kg    Examination:  General exam: Appears calm and comfortable  Respiratory system: Bilateral rhonchorous. Respiratory effort normal. Cardiovascular system: S1 & S2 heard, RRR.  Gastrointestinal system: Abdomen is nondistended, soft and nontender. No organomegaly or masses  felt. Normal bowel sounds heard. Central nervous system: Alert, confused, does not follow commands    Data Reviewed: I have personally reviewed following labs and imaging studies  CBC: Recent Labs  Lab 05/11/23 1344 05/13/23 0501 05/14/23 0412 05/15/23 0502 05/16/23 0528 05/17/23 0455  WBC 6.7 11.0* 11.6* 10.9* 8.3 8.7  NEUTROABS 3.0  --   --   --   --   --   HGB 12.0* 12.2* 12.3* 12.0* 11.6* 11.7*  HCT 34.9* 35.4* 35.0* 35.3* 34.1* 33.9*  MCV 82.9 81.6 81.0 82.7 82.8 82.1  PLT 286 256 233 236 258 273   Basic Metabolic Panel: Recent Labs  Lab 05/13/23 0501 05/13/23 1358 05/13/23 1644 05/14/23 0412 05/14/23 1712 05/15/23 0502 05/16/23 0528 05/17/23 0455  NA 136  --   --  141  --  141 141 142  K 4.1  --   --  4.1  --  4.2 3.9 4.1  CL 101  --   --  102  --  101 102 108  CO2 22  --   --  26  --  27 28 29   GLUCOSE 98  --   --  160*  --  143* 169* 148*  BUN 21  --   --  30*  --  29* 30* 36*  CREATININE 1.24  --   --  0.97  --  0.94 0.93 0.80  CALCIUM 9.2  --   --  9.7  --  9.2 9.4 9.2  MG  --  2.4 2.3 2.4 2.4  --   --   --   PHOS  --  3.3 2.9 2.2* 2.5  --  3.1  --    GFR: Estimated Creatinine Clearance: 56.1 mL/min (by C-G formula based on SCr of 0.8 mg/dL). Liver Function Tests: Recent Labs  Lab 05/11/23 1344  AST 24  ALT 21  ALKPHOS 73  BILITOT 0.5  PROT 8.0  ALBUMIN 4.3   No results for input(s): "LIPASE", "AMYLASE" in the last 168 hours. No results for input(s): "AMMONIA" in the last 168 hours. Coagulation Profile: Recent Labs  Lab 05/11/23 1344  INR 1.0   Cardiac Enzymes: No results for input(s): "CKTOTAL", "CKMB", "CKMBINDEX", "TROPONINI" in the last 168 hours. BNP (last 3 results) No results for input(s): "PROBNP" in the last 8760 hours. HbA1C: No results for input(s): "HGBA1C" in the last 72 hours. CBG: Recent Labs  Lab 05/16/23 1143 05/16/23 1549 05/16/23 1942 05/16/23 2339 05/17/23 0355  GLUCAP 132* 139* 126* 119* 138*   Lipid  Profile: No results for input(s): "CHOL", "HDL", "LDLCALC", "TRIG", "CHOLHDL", "LDLDIRECT" in the last 72 hours. Thyroid Function Tests: No results for input(s): "TSH", "T4TOTAL", "FREET4", "T3FREE", "THYROIDAB" in the last 72 hours. Anemia Panel: No results for input(s): "VITAMINB12", "FOLATE", "FERRITIN", "TIBC", "IRON", "RETICCTPCT" in the last 72 hours. Sepsis Labs: No results for input(s): "PROCALCITON", "LATICACIDVEN" in the last 168 hours.  Recent Results (from the past 240 hours)  MRSA Next Gen by PCR, Nasal     Status: None   Collection Time: 05/11/23  8:12 PM   Specimen: Nasal Mucosa; Nasal Swab  Result Value Ref Range Status   MRSA by PCR Next Gen NOT DETECTED NOT DETECTED Final  Comment: (NOTE) The GeneXpert MRSA Assay (FDA approved for NASAL specimens only), is one component of a comprehensive MRSA colonization surveillance program. It is not intended to diagnose MRSA infection nor to guide or monitor treatment for MRSA infections. Test performance is not FDA approved in patients less than 14 years old. Performed at [facility], [address], [place], [place] [address]   Culture, blood (Routine X 2) w Reflex to ID Panel     Status: None (Preliminary result)   Collection Time: 05/14/23  5:12 PM   Specimen: BLOOD  Result Value Ref Range Status   Specimen Description BLOOD SITE NOT SPECIFIED  Final   Special Requests   Final    BOTTLES DRAWN AEROBIC AND ANAEROBIC Blood Culture adequate volume   Culture   Final    NO GROWTH 3 DAYS Performed at [facility], [address], [place], [place] [address]    Report Status PENDING  Incomplete  Culture, blood (Routine X 2) w Reflex to ID Panel     Status: None (Preliminary result)   Collection Time: 05/14/23  5:20 PM   Specimen: BLOOD  Result Value Ref Range Status   Specimen Description BLOOD SITE NOT SPECIFIED  Final   Special Requests   Final    BOTTLES DRAWN AEROBIC AND ANAEROBIC Blood Culture adequate  volume   Culture   Final    NO GROWTH 3 DAYS Performed at [facility], [address] [place], [place] [address]    Report Status PENDING  Incomplete  Respiratory (~20 pathogens) panel by PCR     Status: None   Collection Time: 05/15/23 12:45 PM  Result Value Ref Range Status   Adenovirus NOT DETECTED NOT DETECTED Final   Coronavirus 229E NOT DETECTED NOT DETECTED Final    Comment: (NOTE) The Coronavirus on the Respiratory Panel, DOES NOT test for the novel  Coronavirus (2019 nCoV)    Coronavirus HKU1 NOT DETECTED NOT DETECTED Final   Coronavirus NL63 NOT DETECTED NOT DETECTED Final   Coronavirus OC43 NOT DETECTED NOT DETECTED Final   Metapneumovirus NOT DETECTED NOT DETECTED Final   Rhinovirus / Enterovirus NOT DETECTED NOT DETECTED Final   Influenza A NOT DETECTED NOT DETECTED Final   Influenza B NOT DETECTED NOT DETECTED Final   Parainfluenza Virus 1 NOT DETECTED NOT DETECTED Final   Parainfluenza Virus 2 NOT DETECTED NOT DETECTED Final   Parainfluenza Virus 3 NOT DETECTED NOT DETECTED Final   Parainfluenza Virus 4 NOT DETECTED NOT DETECTED Final   Respiratory Syncytial Virus NOT DETECTED NOT DETECTED Final   Bordetella pertussis NOT DETECTED NOT DETECTED Final   Bordetella Parapertussis NOT DETECTED NOT DETECTED Final   Chlamydophila pneumoniae NOT DETECTED NOT DETECTED Final   Mycoplasma pneumoniae NOT DETECTED NOT DETECTED Final    Comment: Performed at [facility], [address] [place], [place] [address]         Radiology Studies: No results found.      Scheduled Meds:  amLODipine  10 mg Per Tube Daily   Chlorhexidine Gluconate Cloth  6 each Topical Daily   donepezil  5 mg Per Tube QHS   feeding supplement (PROSource TF20)  60 mL Per Tube BID   guaiFENesin  15 mL Per Tube Q6H   heparin injection (subcutaneous)  5,000 Units Subcutaneous Q12H   insulin aspart  0-6 Units Subcutaneous Q4H   multivitamin with minerals  1 tablet Per Tube Daily    mouth  rinse  15 mL Mouth Rinse 4 times per day   pantoprazole (PROTONIX) IV  40 mg Intravenous QHS   thiamine  100 mg Per Tube Daily   Continuous Infusions:  ceFEPime (MAXIPIME) IV Stopped (05/17/23 0028)   clevidipine     feeding supplement (OSMOLITE 1.5 CAL) 40 mL/hr at 05/17/23 0400     LOS: 6 days    Time spent: 35 Minutes    Elazar Argabright A Manasseh Pittsley, MD [facility]   If 7PM-7AM, please contact night-coverage [email or web address]  05/17/2023, 7:15 AM

## 2023-05-17 NOTE — Progress Notes (Signed)
 Physical Therapy Treatment Patient Details Name: Derek Yates MRN: [id number] DOB: [birth year] Today's Date: 05/17/2023   History of Present Illness 83 yo male presents to [facility] on 2/19 as code stroke for speech deficits, L weakness. 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    PT Comments  Pt lethargic vs obtunded today, shakes head yes/no inconsistently and has limited verbalization and command following. PT focus of session on UE/LE ROM exercises and attempted dangle EOB. Pt requiring total +2 assist for all bed mobility this date, limited by lethargy and severe L lateral bias in sitting. Pt placed in chair position for pulmonary health at end of session. Will progress pt as able.     If plan is discharge home, recommend the following: Two people to help with walking and/or transfers;Two people to help with bathing/dressing/bathroom   Can travel by Doctor, hospital (measurements PT);Wheelchair cushion (measurements PT);Hospital bed;Hoyer lift    Recommendations for Other Services       Precautions / Restrictions Precautions Precautions: Fall Recall of Precautions/Restrictions: Impaired Precaution/Restrictions Comments: Rectal tube, condom cath, NG Feeding tube Restrictions Weight Bearing Restrictions Per Provider Order: No     Mobility  Bed Mobility Overal bed mobility: Needs Assistance Bed Mobility: Rolling, Sit to Supine, Supine to Sit Rolling: Total assist   Supine to sit: Total assist, HOB elevated, +2 for physical assistance Sit to supine: Max assist, HOB elevated, +2 for physical assistance   General bed mobility comments: total assist all aspects, +2    Transfers                   General transfer comment: nt    Ambulation/Gait                   Stairs             Wheelchair Mobility     Tilt Bed    Modified  Rankin (Stroke Patients Only) Modified Rankin (Stroke Patients Only) Pre-Morbid Rankin Score: Slight disability Modified Rankin: Severe disability     Balance Overall balance assessment: Needs assistance Sitting-balance support: Feet supported, Bilateral upper extremity supported Sitting balance-Leahy Scale: Zero Sitting balance - Comments: max assist to attempt to maintain upright, heavy L lateral bias and difficult to correct so returned to supine Postural control: Left lateral lean                                  Communication Communication Communication: Impaired Factors Affecting Communication: Difficulty expressing self;Reduced clarity of speech  Cognition Arousal: Lethargic Behavior During Therapy: Flat affect                           PT - Cognition Comments: pt with brief periods of eye opening only, little command following today. Does not verbalize beyond yes/no Following commands: Impaired      Cueing Cueing Techniques: Verbal cues, Tactile cues  Exercises Other Exercises Other Exercises: exercises in chair position in bed, bilat: AROM SAQs x5, AAROM DF/PF x5, PROM heel slides x5, PROM shoulder abduction/adduction R only x5, AAROM eblow flexion/extension x5    General Comments        Pertinent Vitals/Pain Pain Assessment Pain Assessment: Faces Faces Pain Scale: Hurts little more Pain Location: generalized Pain  Descriptors / Indicators: Grimacing, Discomfort Pain Intervention(s): Monitored during session, Limited activity within patient's tolerance, Repositioned    Home Living                          Prior Function            PT Goals (current goals can now be found in the care plan section) Acute Rehab PT Goals Patient Stated Goal: son present today, wants his dad to be more alert, progressing.  Feels like today is worse (I agree). PT Goal Formulation: With patient/family Time For Goal Achievement:  05/27/23 Potential to Achieve Goals: Fair Progress towards PT goals: Not progressing toward goals - comment (limited by lethargy, fevers/chills)    Frequency    Min 1X/week      PT Plan      Co-evaluation              AM-PAC PT "6 Clicks" Mobility   Outcome Measure  Help needed turning from your back to your side while in a flat bed without using bedrails?: Total Help needed moving from lying on your back to sitting on the side of a flat bed without using bedrails?: Total Help needed moving to and from a bed to a chair (including a wheelchair)?: Total Help needed standing up from a chair using your arms (e.g., wheelchair or bedside chair)?: Total Help needed to walk in hospital room?: Total Help needed climbing 3-5 steps with a railing? : Total 6 Click Score: 6    End of Session Equipment Utilized During Treatment: Oxygen Activity Tolerance: Patient limited by lethargy Patient left: in bed;with call bell/phone within reach;with bed alarm set;with family/visitor present Nurse Communication: Mobility status PT Visit Diagnosis: Other abnormalities of gait and mobility (R26.89);Muscle weakness (generalized) (M62.81)     Time: 1610-9604 PT Time Calculation (min) (ACUTE ONLY): 17 min  Charges:    $Therapeutic Activity: 8-22 mins PT General Charges $$ ACUTE PT VISIT: 1 Visit                     Marye Round, PT DPT Acute Rehabilitation Services Secure Chat Preferred  Office [phone]    Tinamarie Przybylski Sheliah Plane 05/17/2023, 4:25 PM

## 2023-05-17 NOTE — IPAL (Signed)
  Interdisciplinary Goals of Care Family Meeting   Date carried out: 05/17/2023  Location of the meeting: Unit  Member's involved: Son, [place] and physiican.  Durable Power of Insurance risk surveyor: Son, Beckey Downing.  Discussion: We discussed goals of care for Derek Yates .    Discussed patients prognosis.  He may have recurrent PNAs due to difficulty swallowing. His son is POA.  He is very clear that he would not want chest compressions or intubation should he decline.  He is not sure if he wants a PEG tube.  Discussed options but decided to treat PNA and see how he does. Will get pallative care consult as well.   Code status: DNR/DNI.  Disposition: Continue current acute care  Time spent for the meeting: 25    Derek Rutherford, MD  05/17/2023, 10:04 AM

## 2023-05-17 NOTE — Plan of Care (Signed)
  Problem: Education: Goal: Knowledge of General Education information will improve Description: Including pain rating scale, medication(s)/side effects and non-pharmacologic comfort measures Outcome: Not Progressing   Problem: Health Behavior/Discharge Planning: Goal: Ability to manage health-related needs will improve Outcome: Not Progressing   Problem: Clinical Measurements: Goal: Ability to maintain clinical measurements within normal limits will improve Outcome: Not Progressing Goal: Will remain free from infection Outcome: Not Progressing Goal: Diagnostic test results will improve Outcome: Not Progressing Goal: Respiratory complications will improve Outcome: Not Progressing Goal: Cardiovascular complication will be avoided Outcome: Not Progressing   Problem: Nutrition: Goal: Adequate nutrition will be maintained Outcome: Not Progressing   Problem: Coping: Goal: Level of anxiety will decrease Outcome: Not Progressing   Problem: Elimination: Goal: Will not experience complications related to bowel motility Outcome: Not Progressing Goal: Will not experience complications related to urinary retention Outcome: Not Progressing   Problem: Pain Managment: Goal: General experience of comfort will improve and/or be controlled Outcome: Not Progressing   Problem: Safety: Goal: Ability to remain free from injury will improve Outcome: Not Progressing   Problem: Skin Integrity: Goal: Risk for impaired skin integrity will decrease Outcome: Not Progressing   Problem: Education: Goal: Knowledge of disease or condition will improve Outcome: Not Progressing Goal: Knowledge of secondary prevention will improve (MUST DOCUMENT ALL) Outcome: Not Progressing Goal: Knowledge of patient specific risk factors will improve (DELETE if not current risk factor) Outcome: Not Progressing   Problem: Intracerebral Hemorrhage Tissue Perfusion: Goal: Complications of Intracerebral Hemorrhage  will be minimized Outcome: Not Progressing   Problem: Coping: Goal: Will verbalize positive feelings about self Outcome: Not Progressing Goal: Will identify appropriate support needs Outcome: Not Progressing   Problem: Health Behavior/Discharge Planning: Goal: Ability to manage health-related needs will improve Outcome: Not Progressing Goal: Goals will be collaboratively established with patient/family Outcome: Not Progressing   Problem: Self-Care: Goal: Ability to participate in self-care as condition permits will improve Outcome: Not Progressing Goal: Verbalization of feelings and concerns over difficulty with self-care will improve Outcome: Not Progressing Goal: Ability to communicate needs accurately will improve Outcome: Not Progressing   Problem: Nutrition: Goal: Risk of aspiration will decrease Outcome: Not Progressing Goal: Dietary intake will improve Outcome: Not Progressing   Problem: Education: Goal: Ability to describe self-care measures that may prevent or decrease complications (Diabetes Survival Skills Education) will improve Outcome: Not Progressing Goal: Individualized Educational Video(s) Outcome: Not Progressing   Problem: Coping: Goal: Ability to adjust to condition or change in health will improve Outcome: Not Progressing   Problem: Fluid Volume: Goal: Ability to maintain a balanced intake and output will improve Outcome: Not Progressing   Problem: Health Behavior/Discharge Planning: Goal: Ability to identify and utilize available resources and services will improve Outcome: Not Progressing Goal: Ability to manage health-related needs will improve Outcome: Not Progressing   Problem: Metabolic: Goal: Ability to maintain appropriate glucose levels will improve Outcome: Not Progressing   Problem: Nutritional: Goal: Maintenance of adequate nutrition will improve Outcome: Not Progressing Goal: Progress toward achieving an optimal weight will  improve Outcome: Not Progressing   Problem: Skin Integrity: Goal: Risk for impaired skin integrity will decrease Outcome: Not Progressing   Problem: Tissue Perfusion: Goal: Adequacy of tissue perfusion will improve Outcome: Not Progressing

## 2023-05-17 NOTE — Progress Notes (Signed)
 Nutrition Follow-up  DOCUMENTATION CODES:  Severe malnutrition in context of chronic illness  INTERVENTION:  Continue tube feeding via Cortrak tube: Osmolite 1.5 at 40 ml/h (960 ml per day) Prosource TF20 60 ml BID   Provides 1600 kcal, 100 gm protein, 729 ml free water daily   100 mg thiamine daily via tube x 4 more days MVI with minerals daily via tube  Banatrol BID, each packet provides 5g soluble fiber   NUTRITION DIAGNOSIS:  Severe Malnutrition related to chronic illness as evidenced by severe fat depletion, severe muscle depletion. - remains applicable  GOAL:  Patient will meet greater than or equal to 90% of their needs - being met with tube feed regimen  MONITOR:  Diet advancement, TF tolerance, Labs  REASON FOR ASSESSMENT:  Consult Enteral/tube feeding initiation and management  ASSESSMENT:  Pt with PMH of HTN, CVA, recurrent UTIs, and dementia admitted with acute R ICH with IVH likely due to advanced CAA.  2/19 admitted w/ R caudate bleed 2/21 - s/p cortrak placement; tip gastric  2/22 - bedside swallow: NPO, CT head, CXR 2/25 - CT of chest: PNA  Patient originally slated to transfer out of ICU today, however spiked fever yesterday afternoon and again overnight. Per RN, he has required suctioning of mucus. CT of chest completed today. Findings consistent with  aspiration PNA. Sent MRSA nasal sample today, along with blood, urine, and stool cultures. GOC meeting today. Decision to treat PNA and make decisions about PEG tube after patient progress can be better assessed. Prefers D/C to SNF in [place].  Spoke with patient's son and his wife at bedside today. Discussed what aspiration was and purpose of tube feed. They endorsed he has had no recent changes in appetite PTA. No difficulties with chewing or swallowing at baseline. Does eat 3 meals per day and rarely snacks. He was independent prior to stroke.    24 Hour Recall B: Cereal w/ milk L: rice w/ protein  and vegetable D: same as lunch  No N/V with tube feed running at goal rate. Some diarrhea. Banatrol started today in effort to bulk stool.    Admit Weight: 61.9kg Current Weight: 58.8kg  Patient family report no significant wt changes PTA. He is of small body habitus. Patient with UOP yesterday and stool. Fecal management system in place.  Intake/Output Summary (Last 24 hours) at 05/17/2023 1901 Last data filed at 05/17/2023 1756 Gross per 24 hour  Intake 2103.42 ml  Output 1315 ml  Net 788.42 ml     Meds: SSI, MVI, pantoprazole, thiamine, IV ABX NS @ 62ml/hr  Labs: BUN 29>30>36 CBGs 148-19 x48 hours A1c 5.7 (04/2023)  Diet Order:   Diet Order     None   EDUCATION NEEDS:  Not appropriate for education at this time  Skin:  Skin Assessment: Reviewed RN Assessment  Last BM:  2/25 - type 7 via FMS  Height:  Ht Readings from Last 1 Encounters:  05/11/23 5\' 8"  (1.727 m)   Weight:  Wt Readings from Last 1 Encounters:  05/17/23 58.8 kg   Ideal Body Weight:     BMI:  Body mass index is 19.71 kg/m.  Estimated Nutritional Needs:   Kcal:  1600-1800  Protein:  90-100 grams  Fluid:  >1.6 L/day  Myrtie Cruise MS, RD, LDN Registered Dietitian Clinical Nutrition RD Inpatient Contact Info in Amion

## 2023-05-17 NOTE — Progress Notes (Signed)
 0800 patient alert able to make most needs known has lot of mucus that he can't clear 1200 CT chest completed  1400 cultures of urine stool and sputum sent and a MRSA nasal sample sent 1600 temp spiked lab called for blood cultures to be obtained. New IV meds started.

## 2023-05-18 DIAGNOSIS — I610 Nontraumatic intracerebral hemorrhage in hemisphere, subcortical: Secondary | ICD-10-CM | POA: Diagnosis not present

## 2023-05-18 DIAGNOSIS — I6389 Other cerebral infarction: Secondary | ICD-10-CM | POA: Diagnosis not present

## 2023-05-18 LAB — GASTROINTESTINAL PANEL BY PCR, STOOL (REPLACES STOOL CULTURE)

## 2023-05-18 LAB — CBC
HCT: 34.4 % — ABNORMAL LOW (ref 39.0–52.0)
Hemoglobin: 11.6 g/dL — ABNORMAL LOW (ref 13.0–17.0)
MCH: 28 pg (ref 26.0–34.0)
MCHC: 33.7 g/dL (ref 30.0–36.0)
MCV: 83.1 fL (ref 80.0–100.0)
Platelets: 287 10*3/uL (ref 150–400)
RBC: 4.14 MIL/uL — ABNORMAL LOW (ref 4.22–5.81)
RDW: 15.1 % (ref 11.5–15.5)
WBC: 12.4 10*3/uL — ABNORMAL HIGH (ref 4.0–10.5)
nRBC: 0 % (ref 0.0–0.2)

## 2023-05-18 LAB — BASIC METABOLIC PANEL
Anion gap: 8 (ref 5–15)
BUN: 34 mg/dL — ABNORMAL HIGH (ref 8–23)
CO2: 30 mmol/L (ref 22–32)
Calcium: 9.5 mg/dL (ref 8.9–10.3)
Chloride: 105 mmol/L (ref 98–111)
Creatinine, Ser: 0.79 mg/dL (ref 0.61–1.24)
GFR, Estimated: >60 mL/min (ref >60)
Glucose, Bld: 135 mg/dL — ABNORMAL HIGH (ref 70–99)
Potassium: 4.6 mmol/L (ref 3.5–5.1)
Sodium: 143 mmol/L (ref 135–145)

## 2023-05-18 LAB — GLUCOSE, CAPILLARY
Glucose-Capillary: 144 mg/dL — ABNORMAL HIGH (ref 70–99)
Glucose-Capillary: 120 mg/dL — ABNORMAL HIGH (ref 70–99)
Glucose-Capillary: 137 mg/dL — ABNORMAL HIGH (ref 70–99)
Glucose-Capillary: 133 mg/dL — ABNORMAL HIGH (ref 70–99)
Glucose-Capillary: 119 mg/dL — ABNORMAL HIGH (ref 70–99)
Glucose-Capillary: 126 mg/dL — ABNORMAL HIGH (ref 70–99)

## 2023-05-18 LAB — CULTURE, OB URINE: Culture: NO GROWTH

## 2023-05-18 NOTE — TOC Progression Note (Signed)
 Transition of Care [facility]) - Progression Note    Patient Details  Name: Derek Yates MRN: [id number] Date of Birth: 11-Oct-1940  Transition of Care [facility]) CM/SW Contact  Mearl Latin, LCSW Phone Number: 05/18/2023, 4:54 PM  Clinical Narrative:    CSW continuing to follow for removal of cortrak for SNF referrals to be sent out.    Expected Discharge Plan: Skilled Nursing Facility Barriers to Discharge: Continued Medical Work up, SNF Pending bed offer, English as a second language teacher  Expected Discharge Plan and Services   Discharge Planning Services: CM Consult Post Acute Care Choice: Skilled Nursing Facility Living arrangements for the past 2 months: Single Family Home                                       Social Determinants of Health (SDOH) Interventions SDOH Screenings   Food Insecurity: Patient Unable To Answer (05/13/2023)  Housing: Patient Unable To Answer (05/13/2023)  Transportation Needs: Patient Unable To Answer (05/13/2023)  Utilities: Patient Unable To Answer (05/13/2023)  Depression (PHQ2-9): Medium Risk (02/10/2023)  Financial Resource Strain: Low Risk  (06/20/2021)   Received from [facility], [facility]  Social Connections: Patient Unable To Answer (05/13/2023)  Tobacco Use: Low Risk  (05/04/2023)    Readmission Risk Interventions     No data to display

## 2023-05-18 NOTE — Plan of Care (Signed)
  Problem: Education: Goal: Knowledge of General Education information will improve Description: Including pain rating scale, medication(s)/side effects and non-pharmacologic comfort measures Outcome: Not Progressing   Problem: Health Behavior/Discharge Planning: Goal: Ability to manage health-related needs will improve Outcome: Not Progressing   Problem: Clinical Measurements: Goal: Ability to maintain clinical measurements within normal limits will improve Outcome: Not Progressing Goal: Will remain free from infection Outcome: Not Progressing Goal: Diagnostic test results will improve Outcome: Not Progressing Goal: Respiratory complications will improve Outcome: Not Progressing Goal: Cardiovascular complication will be avoided Outcome: Not Progressing   Problem: Activity: Goal: Risk for activity intolerance will decrease Outcome: Not Progressing   Problem: Nutrition: Goal: Adequate nutrition will be maintained Outcome: Not Progressing   Problem: Coping: Goal: Level of anxiety will decrease Outcome: Not Progressing   Problem: Elimination: Goal: Will not experience complications related to bowel motility Outcome: Not Progressing Goal: Will not experience complications related to urinary retention Outcome: Not Progressing   Problem: Pain Managment: Goal: General experience of comfort will improve and/or be controlled Outcome: Not Progressing   Problem: Safety: Goal: Ability to remain free from injury will improve Outcome: Not Progressing   Problem: Skin Integrity: Goal: Risk for impaired skin integrity will decrease Outcome: Not Progressing   Problem: Education: Goal: Knowledge of disease or condition will improve Outcome: Not Progressing Goal: Knowledge of secondary prevention will improve (MUST DOCUMENT ALL) Outcome: Not Progressing Goal: Knowledge of patient specific risk factors will improve (DELETE if not current risk factor) Outcome: Not Progressing    Problem: Intracerebral Hemorrhage Tissue Perfusion: Goal: Complications of Intracerebral Hemorrhage will be minimized Outcome: Not Progressing   Problem: Health Behavior/Discharge Planning: Goal: Ability to manage health-related needs will improve Outcome: Not Progressing Goal: Goals will be collaboratively established with patient/family Outcome: Not Progressing   Problem: Self-Care: Goal: Ability to participate in self-care as condition permits will improve Outcome: Not Progressing Goal: Verbalization of feelings and concerns over difficulty with self-care will improve Outcome: Not Progressing Goal: Ability to communicate needs accurately will improve Outcome: Not Progressing   Problem: Nutrition: Goal: Risk of aspiration will decrease Outcome: Not Progressing Goal: Dietary intake will improve Outcome: Not Progressing   Problem: Education: Goal: Ability to describe self-care measures that may prevent or decrease complications (Diabetes Survival Skills Education) will improve Outcome: Not Progressing Goal: Individualized Educational Video(s) Outcome: Not Progressing   Problem: Coping: Goal: Ability to adjust to condition or change in health will improve Outcome: Not Progressing   Problem: Fluid Volume: Goal: Ability to maintain a balanced intake and output will improve Outcome: Not Progressing   Problem: Health Behavior/Discharge Planning: Goal: Ability to identify and utilize available resources and services will improve Outcome: Not Progressing Goal: Ability to manage health-related needs will improve Outcome: Not Progressing   Problem: Metabolic: Goal: Ability to maintain appropriate glucose levels will improve Outcome: Not Progressing   Problem: Nutritional: Goal: Maintenance of adequate nutrition will improve Outcome: Not Progressing Goal: Progress toward achieving an optimal weight will improve Outcome: Not Progressing   Problem: Skin Integrity: Goal:  Risk for impaired skin integrity will decrease Outcome: Not Progressing   Problem: Tissue Perfusion: Goal: Adequacy of tissue perfusion will improve Outcome: Not Progressing

## 2023-05-18 NOTE — Consult Note (Signed)
 Consultation Note Date: 05/18/2023   Patient Name: Derek Yates  DOB: 1940-04-19  MRN: [id number]  Age / Sex: 83 y.o., male  PCP: [name], MD Referring Physician: Stroke, Md, MD  Reason for Consultation: Establishing goals of care  HPI/Patient Profile: 83 y.o. 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 Hospitalization complicated by aspiration pneumonia as well as possible neurogenic fever.   Clinical Assessment and Goals of Care: Consult received and chart review completed. I met today with son. We reviewed his father's current status with lethargy, dysphagia, stroke, and pneumonia. We reviewed his father's baseline prior to hospitalization. We discussed the importance of his father's independence, dignity, and respect. He shares that he has been a firm father but much less so with the grandchildren - we discussed how this is often the case! [name] own father died when he was a teenager and he had to work to support his family at this time. He has helped to support his siblings and have helped them come to [place] and pursue education. He has been married to his wife of many years and they have one son. His wife has been his caregiver for some time now.   We reviewed concern for poor progress. Son has been struggling with path forward. He felt his father was much better the first days here before pneumonia. This is why he wants to ensure that pneumonia is treated and his father is given every opportunity for improvement. We spent time reviewing the risks vs benefits of PEG tube. We also discussed paths from here including SNF rehab, LTC, ALF (living there with his wife), home. We discussed  quality of life and what his father would want. We reviewed alternative care with comfort feeds, no feeding tube, comfort care as well as hospice care and options at home and hospice facility.   Son is having a difficult time knowing what to do. We spent time discussing how to know the right next steps for his father. At this time he would like to have some time, discuss with his mother, and then reach out to the rest of the family to have them come in from out of town for visit. He would like to take the next few days to take time to think and discuss with his mother and family. He does not feel his father would want to be in a facility including hospice facility. He does not feel that his father would want to die at home where his wife would continue to live. Ultimately family may want to consider comfort care here in the hospital but need time to consider and discuss while we continue treatment to ensure his father is given every opportunity for improvement before considering shift to comfort care.   Updated Dr. David Stall.All questions/concerns addressed to the best of my ability. Emotional support provided.  I will return to service 3/4 - son is aware and has palliative contact if needs in the meantime. I will follow up on 3/4.   Primary Decision Maker HCPOA son Producer, television/film/video    SUMMARY OF RECOMMENDATIONS   - DNR/DNI in place - Considering path forward - I will follow up 3/4  Code Status/Advance Care Planning: DNR   Symptom Management:  Per attending.   Prognosis:  Overall prognosis poor.   Discharge Planning: To Be Determined      Primary Diagnoses: Present on Admission:  Stroke (cerebrum) (HCC)   I have reviewed the medical record, interviewed the patient and family, and examined the patient. The following aspects are pertinent.  No past medical history on file. Social History   Socioeconomic History   Marital status: Married    Spouse name: Not on file   Number of  children: Not on file   Years of education: Not on file   Highest education level: Not on file  Occupational History   Not on file  Tobacco Use   Smoking status: Never   Smokeless tobacco: Never  Substance and Sexual Activity   Alcohol use: Not on file   Drug use: Not on file   Sexual activity: Not on file  Other Topics Concern   Not on file  Social History Narrative   Not on file   Social Drivers of Health   Financial Resource Strain: Low Risk  (06/20/2021)   [facility], [facility]   Overall Financial Resource Strain (CARDIA)    Difficulty of Paying Living Expenses: Not hard at all  Food Insecurity: Patient Unable To Answer (05/13/2023)   Hunger Vital Sign    Worried About Running Out of Food in the Last Year: Patient unable to answer    Ran Out of Food in the Last Year: Patient unable to answer  Transportation Needs: Patient Unable To Answer (05/13/2023)   PRAPARE - Transportation    Lack of Transportation (Medical): Patient unable to answer    Lack of Transportation (Non-Medical): Patient unable to answer  Physical Activity: Not on file  Stress: Not on file  Social Connections: Patient Unable To Answer (05/13/2023)   Social Connection and Isolation Panel [NHANES]    Frequency of Communication with Friends and Family: Patient unable to answer    Frequency of Social Gatherings with Friends and Family: Patient unable to answer    Attends Religious Services: Patient unable to answer    Active Member of Clubs or Organizations: Patient unable to answer    Attends Banker Meetings: Patient unable to answer    Marital Status: Patient unable to answer   No family history on file. Scheduled Meds:  amLODipine  10 mg Per Tube Daily   bromocriptine  1.25 mg Per Tube QID   Chlorhexidine Gluconate Cloth  6 each Topical Daily   feeding supplement (PROSource TF20)  60 mL Per Tube BID   fiber supplement (BANATROL TF)  60 mL Per Tube BID   guaiFENesin   15 mL Per Tube Q6H   heparin injection (subcutaneous)  5,000 Units Subcutaneous Q12H   insulin aspart  0-6 Units Subcutaneous Q4H   multivitamin with minerals  1 tablet Per Tube Daily   mouth rinse  15 mL Mouth Rinse 4 times per day   pantoprazole (PROTONIX) IV  40 mg Intravenous QHS   thiamine  100 mg Per Tube Daily   Continuous Infusions:  ceFEPime (MAXIPIME) IV Stopped (  05/18/23 1053)   clevidipine     feeding supplement (OSMOLITE 1.5 CAL) 40 mL/hr at 05/18/23 1200   metronidazole 500 mg (05/18/23 1306)   PRN Meds:.acetaminophen **OR** acetaminophen (TYLENOL) oral liquid 160 mg/5 mL **OR** acetaminophen, clevidipine, labetalol, mouth rinse No Known Allergies Review of Systems  Unable to perform ROS: Acuity of condition    Physical Exam Vitals and nursing note reviewed.  Constitutional:      General: He is sleeping. He is not in acute distress.    Appearance: He is ill-appearing.  Cardiovascular:     Rate and Rhythm: Normal rate.  Pulmonary:     Effort: No tachypnea, accessory muscle usage or respiratory distress.  Abdominal:     General: Abdomen is flat.     Vital Signs: BP 126/68 (BP Location: Right Arm)   Pulse 96   Temp 100 F (37.8 C) (Axillary)   Resp (!) 30   Wt 61.8 kg   SpO2 97%   BMI 20.72 kg/m  Pain Scale: 0-10   Pain Score: 0-No pain   SpO2: SpO2: 97 % O2 Device:SpO2: 97 % O2 Flow Rate: .O2 Flow Rate (L/min): 4 L/min  IO: Intake/output summary:  Intake/Output Summary (Last 24 hours) at 05/18/2023 1323 Last data filed at 05/18/2023 1200 Gross per 24 hour  Intake 3263.1 ml  Output 1290 ml  Net 1973.1 ml    LBM: Last BM Date : 05/17/23 Baseline Weight: Weight: 61.9 kg Most recent weight: Weight: 61.8 kg     Palliative Assessment/Data:    Time Total: 90 min  Greater than 50%  of this time was spent counseling and coordinating care related to the above assessment and plan.  Signed by: Yong Channel, NP Palliative Medicine Team Pager #  [phone] (M-F 8a-5p) Team Phone # [phone] (Nights/Weekends)

## 2023-05-18 NOTE — Progress Notes (Addendum)
 STROKE TEAM PROGRESS NOTE   INTERIM HISTORY/SUBJECTIVE  Grand daughter at bedside.   Fever overnight 101.6 and yesterday afternoon at 103.1  On Cefepime and flagyl added by hospitalist.       OBJECTIVE  CBC    Component Value Date/Time   WBC 8.7 05/17/2023 0455   RBC 4.13 (L) 05/17/2023 0455   HGB 11.7 (L) 05/17/2023 0455   HGB 13.3 02/03/2023 0852   HCT 33.9 (L) 05/17/2023 0455   HCT 39.9 02/03/2023 0852   PLT 273 05/17/2023 0455   PLT 271 02/03/2023 0852   MCV 82.1 05/17/2023 0455   MCV 87 02/03/2023 0852   MCH 28.3 05/17/2023 0455   MCHC 34.5 05/17/2023 0455   RDW 15.2 05/17/2023 0455   RDW 13.2 02/03/2023 0852   LYMPHSABS 2.8 05/11/2023 1344   LYMPHSABS 2.6 02/03/2023 0852   MONOABS 0.8 05/11/2023 1344   EOSABS 0.1 05/11/2023 1344   EOSABS 0.3 02/03/2023 0852   BASOSABS 0.0 05/11/2023 1344   BASOSABS 0.1 02/03/2023 0852    BMET    Component Value Date/Time   NA 142 05/17/2023 0455   NA 143 02/03/2023 0852   K 4.1 05/17/2023 0455   CL 108 05/17/2023 0455   CO2 29 05/17/2023 0455   GLUCOSE 148 (H) 05/17/2023 0455   BUN 36 (H) 05/17/2023 0455   BUN 15 02/03/2023 0852   CREATININE 0.80 05/17/2023 0455   CALCIUM 9.2 05/17/2023 0455   EGFR 76 02/03/2023 0852   GFRNONAA >60 05/17/2023 0455    IMAGING past 24 hours CT CHEST WO CONTRAST Result Date: 05/17/2023 CLINICAL DATA:  Fever of unknown origin. EXAM: CT CHEST WITHOUT CONTRAST TECHNIQUE: Multidetector CT imaging of the chest was performed following the standard protocol without IV contrast. RADIATION DOSE REDUCTION: This exam was performed according to the departmental dose-optimization program which includes automated exposure control, adjustment of the mA and/or kV according to patient size and/or use of iterative reconstruction technique. COMPARISON:  None Available. FINDINGS: Cardiovascular: Heart is normal in size and there is a trace pericardial effusion. Three-vessel coronary artery calcifications  are noted. There is atherosclerotic calcification of the aorta without evidence of aneurysm. The pulmonary trunk is normal in caliber. Mediastinum/Nodes: No mediastinal or axillary lymphadenopathy bilaterally. Evaluation of the hila is limited due to lack of IV contrast. The thyroid gland is within normal limits. An enteric tube is seen in the esophagus. Lungs/Pleura: There is frothy debris in the distal esophagus extending into the bilateral lower lobe airways. There is bronchial wall thickening with multifocal mucous plugging in the lower lobes with mild patchy airspace disease at the lung bases. No effusion or pneumothorax is seen. Upper Abdomen: Stones are present within the gallbladder. There is a cyst in the upper pole of the left kidney. An enteric tube is seen in the stomach and proximal duodenum. No acute abnormality Musculoskeletal: . degenerative changes are present in the thoracic spine. No acute osseous abnormality is seen. IMPRESSION: 1. Frothy debris in the distal trachea extending into the lower lobe bronchi bilaterally with multifocal mucous plugging and patchy airspace disease at the lung bases, suggesting aspiration pneumonia. 2. Multi-vessel coronary artery calcifications. 3. Aortic atherosclerosis. 4. Cholelithiasis. Electronically Signed   By: Thornell Sartorius M.D.   On: 05/17/2023 12:24     Vitals:   05/18/23 0300 05/18/23 0400 05/18/23 0500 05/18/23 0600  BP: 119/62 128/65 128/67 137/65  Pulse: 90 96 85 90  Resp: (!) 24 (!) 28 16 (!) 22  Temp:  99.2  F (37.3 C)    TempSrc:  Axillary    SpO2: 93% 96% 96% 97%  Weight:   61.8 kg      PHYSICAL EXAM General:  Alert, well-nourished, well-developed patient in no acute distress Psych:  Mood and affect appropriate for situation CV: Regular rate and rhythm on monitor Respiratory:  Regular, unlabored respirations on room air. Mild rhonchi on right side.  NEURO:  Mental Status: more drowsy today but will arouse. Oriented to name.  Tells me he is the hospital. Year is 2025 then says 2027  Follows commands. Sticks tongue out. Showed me thumbs up Speech/Language: Hypophonic speech speech is without dysarthria or aphasia.  Naming, repetition, fluency, and comprehension intact.  Cranial Nerves:  II: PERRL.  Intermittent blink to threat on left III, IV, VI: EOMI. Eyelids elevate symmetrically.  V: Sensation is intact to light touch and symmetrical to face.  VII: Left facial droop VIII: hearing intact to voice. IX, X: Palate elevates symmetrically. Phonation is normal.  ZO:[id number] shrug 5/5. XII: tongue is midline without fasciculations. Quiet voice.  Motor: Left arm weakness 3/5, bilateral lower extremities not antigravity, can wiggle toes Tone: is normal and bulk is normal Sensation- Intact to light touch bilaterally. Extinction absent to light touch to DSS.   Coordination: FTN intact bilaterally, HKS: no ataxia in BLE.No drift.  Gait- deferred     ASSESSMENT/PLAN  Derek Yates is a 83 y.o. male with history of hypertension, CVA, recurrent UTIs, dementia who presented to [facility] as a code stroke due to speech deficits, left-sided deficits.  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 Intraventricular penetration with the amount of intraventricular blood being similar. CTA not pursued as it will not change mangement MRI ICH centered at the right caudate head with IVH and mild communicating hydrocephalus. 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  Old left frontal, left occipital and right parieto-occipital infarcts. CT repeat  2/21: Roughly 6 mL Right basal ganglia hemorrhage has increased from approximately 4 mL at presentation. Stable moderate volume of IVH. No acute ventriculomegaly.  Mild leftward shift of 3 mm stable CT head repeat 2/22 stable 2D Echo: LVEF 65 to 70%  LDL 69 HgbA1c 5.7 VTE prophylaxis - SCDs No antithrombotic prior to admission, now on No antithrombotic due to ICH and CAA Therapy recommendations: CIR Disposition pending  Advanced CAA MRI showed advanced CAA No antithrombotics Strict BP control less than 140 Recommend no statin at this time  Hypertension Home meds:  nifedipine 30mg  Stable BP goal less than 140 given CAA On amlodipine 10 Strict BP control at home  Hyperlipidemia Home meds:  lipitor 10mg  LDL 66, goal < 70 Recommend no statin given advanced CAA  Dysphagia Patient has post-stroke dysphagia SLP consulted Now n.p.o. Core track placed On tube feeds  Dementia Severe memory loss at baseline Able to walk with assistance at home Home meds including Aricept  Other Stroke Risk Factors Advanced Age  Other Active Problems Recurrent UTIs, Leukocytosis-WBC 11.0, afebrile-will monitor AKI, Cr 1.40-1.24>0.93 Intermittent cough, Supplemental Oxygen needed CXR:  Mild pulmonary vascular congestion  No evidence of pneumonia Continue 3L [place], wean as able  fever on 2/22, repeat CXR 2/22 CCM consulted Started on Cefepime.   Hospital day # 7  Follows commands. More lethargic.  On abx for PNA cefepime and flagyl. On bromocriptine. Still spiking fevers possible it's central from ICH.   Appreciate Hospitalist assistance.   Grand daughter updated.   Transfer to progressive unit today.   This patient is critically ill due to ICH with IVH, PNA and at significant risk of neurological worsening, death form heart failure, respiratory failure, recurrent stroke, bleeding from [facility], seizure,  sepsis. This patient's care requires constant monitoring of vital signs, hemodynamics, respiratory and cardiac monitoring, review of multiple databases, neurological assessment, discussion with family, other specialists and medical decision making of high complexity. I spent 40 minutes of neurocritical care time in the care of this patient.   Hilja Kintzel,MD   To contact Stroke Continuity provider, please refer to [email or web address]. After hours, contact General Neurology

## 2023-05-18 NOTE — Progress Notes (Signed)
 [facility] PROGRESS NOTE    Progress Note  [name]  ZOX:[id number] DOB: November 24, 1940 DOA: 05/11/2023 PCP: [name], MD     Brief Narrative:   Derek Yates is an 83 y.o. 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 Repeated CT scan on 05/13/2023 showed roughly basal ganglia hemorrhage which showed increase in size from previous. Further management per neurology Physical therapy evaluated the patient and recommended inpatient rehab  Fevers aspiration pneumonia versus neurogenic fever: CT chest confirmed infiltrate. Tmax of 101.6. Started on cefepime on 05/15/2023, Flagyl added on 05/17/2023. He continues to spike fevers despite IV antibiotics Flagyl was added yesterday. His leukocytosis has resolved Palliative care was consulted.  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 Continue statins.  Acute kidney injury: Likely hemodynamic mediated resolved with IV fluid resuscitation.  Essential hypertension: Continue nifedipine.  Hyperlipidemia: Continue statins.  Dysphagia: Currently n.p.o. with a core track.  Dementia: Severe memory loss  Protein-calorie malnutrition, severe noted   DVT prophylaxis: SCD Family Communication:none Status is: Inpatient Remains inpatient appropriate because: Acute intracranial hemorrhage    Code Status:      Code Status Orders  (From admission, onward)           Start     Ordered   05/17/23 1008  Do not attempt resuscitation (DNR)- Limited -Do Not Intubate (DNI)  (Code Status)  Continuous       Question Answer Comment  If pulseless and not breathing No CPR or chest compressions.   In Pre-Arrest Conditions (Patient Is Breathing and Has A Pulse) Do not intubate. Provide all appropriate non-invasive medical interventions. Avoid ICU transfer unless indicated or required.   Consent: Discussion documented in EHR or advanced directives reviewed      05/17/23 1008           Code Status History     Date Active Date Inactive Code Status Order ID Comments User Context   05/11/2023 2022 05/17/2023 1008 Full Code [id number]  [name], MD Inpatient         IV Access:   Peripheral IV   Procedures and diagnostic studies:   CT CHEST WO CONTRAST Result Date: 05/17/2023 CLINICAL DATA:  Fever of unknown origin. EXAM: CT CHEST WITHOUT CONTRAST TECHNIQUE: Multidetector CT imaging of the chest was performed following the standard protocol without IV contrast. RADIATION DOSE REDUCTION: This exam was performed according to the departmental dose-optimization program which includes automated exposure control, adjustment of the mA and/or kV according to patient size and/or use of iterative reconstruction technique. COMPARISON:  None Available. FINDINGS: Cardiovascular: Heart is normal in size and there is a trace pericardial effusion. Three-vessel coronary artery calcifications are noted. There is atherosclerotic calcification of the aorta without evidence of aneurysm. The pulmonary trunk is normal in caliber. Mediastinum/Nodes: No mediastinal or axillary lymphadenopathy bilaterally. Evaluation of the hila is limited due to lack of IV contrast. The thyroid gland is within normal limits. An enteric tube is seen in the  esophagus. Lungs/Pleura: There is frothy debris in the distal esophagus  extending into the bilateral lower lobe airways. There is bronchial wall thickening with multifocal mucous plugging in the lower lobes with mild patchy airspace disease at the lung bases. No effusion or pneumothorax is seen. Upper Abdomen: Stones are present within the gallbladder. There is a cyst in the upper pole of the left kidney. An enteric tube is seen in the stomach and proximal duodenum. No acute abnormality Musculoskeletal: . degenerative changes are present in the thoracic spine. No acute osseous abnormality is seen. IMPRESSION: 1. Frothy debris in the distal trachea extending into the lower lobe bronchi bilaterally with multifocal mucous plugging and patchy airspace disease at the lung bases, suggesting aspiration pneumonia. 2. Multi-vessel coronary artery calcifications. 3. Aortic atherosclerosis. 4. Cholelithiasis. Electronically Signed   By: Thornell Sartorius M.D.   On: 05/17/2023 12:24     Medical Consultants:   None.   Subjective:    [name] no new complaints today.  Objective:    Vitals:   05/18/23 0300 05/18/23 0400 05/18/23 0500 05/18/23 0600  BP: 119/62 128/65 128/67 137/65  Pulse: 90 96 85 90  Resp: (!) 24 (!) 28 16 (!) 22  Temp:  99.2 F (37.3 C)    TempSrc:  Axillary    SpO2: 93% 96% 96% 97%  Weight:   61.8 kg    SpO2: 97 % O2 Flow Rate (L/min): 4 L/min   Intake/Output Summary (Last 24 hours) at 05/18/2023 0650 Last data filed at 05/18/2023 0600 Gross per 24 hour  Intake 3079.1 ml  Output 840 ml  Net 2239.1 ml   Filed Weights   05/16/23 0403 05/17/23 0500 05/18/23 0500  Weight: 59.4 kg 58.8 kg 61.8 kg    Exam: General exam: In no acute distress. Respiratory system: Good air movement and clear to auscultation. Cardiovascular system: S1 & S2 heard, RRR. No JVD. Gastrointestinal system: Abdomen is nondistended, soft and nontender.  Central nervous system: Alert and oriented. No focal neurological deficits. Extremities: No pedal edema. Skin: No  rashes, lesions or ulcers Psychiatry: Judgement and insight appear normal. Mood & affect appropriate.    Data Reviewed:    Labs: Basic Metabolic Panel: Recent Labs  Lab 05/13/23 0501 05/13/23 1358 05/13/23 1644 05/14/23 0412 05/14/23 1712 05/15/23 0502 05/16/23 0528 05/17/23 0455  NA 136  --   --  141  --  141 141 142  K 4.1  --   --  4.1  --  4.2 3.9 4.1  CL 101  --   --  102  --  101 102 108  CO2 22  --   --  26  --  27 28 29   GLUCOSE 98  --   --  160*  --  143* 169* 148*  BUN 21  --   --  30*  --  29* 30* 36*  CREATININE 1.24  --   --  0.97  --  0.94 0.93 0.80  CALCIUM 9.2  --   --  9.7  --  9.2 9.4 9.2  MG  --  2.4 2.3 2.4 2.4  --   --   --   PHOS  --  3.3 2.9 2.2* 2.5  --  3.1  --    GFR Estimated Creatinine Clearance: 59 mL/min (by C-G formula based on SCr of 0.8 mg/dL). Liver Function Tests: Recent Labs  Lab 05/11/23 1344  AST 24  ALT 21  ALKPHOS 73  BILITOT 0.5  PROT 8.0  ALBUMIN 4.3   No results for input(s): "LIPASE", "AMYLASE" in the last 168 hours. No results for input(s): "AMMONIA" in the last 168 hours. Coagulation profile Recent Labs  Lab 05/11/23 1344  INR 1.0   COVID-19 Labs  No results for input(s): "DDIMER", "FERRITIN", "LDH", "CRP" in the last 72 hours.  Lab Results  Component Value Date   SARSCOV2NAA Not Detected 09/14/2018    CBC: Recent Labs  Lab 05/11/23 1344 05/13/23 0501 05/14/23 0412 05/15/23 0502 05/16/23 0528 05/17/23 0455  WBC 6.7 11.0* 11.6* 10.9* 8.3 8.7  NEUTROABS 3.0  --   --   --   --   --   HGB 12.0* 12.2* 12.3* 12.0* 11.6* 11.7*  HCT 34.9* 35.4* 35.0* 35.3* 34.1* 33.9*  MCV 82.9 81.6 81.0 82.7 82.8 82.1  PLT 286 256 233 236 258 273   Cardiac Enzymes: No results for input(s): "CKTOTAL", "CKMB", "CKMBINDEX", "TROPONINI" in the last 168 hours. BNP (last 3 results) No results for input(s): "PROBNP" in the last 8760 hours. CBG: Recent Labs  Lab 05/17/23 1155 05/17/23 1533 05/17/23 1937 05/17/23 2352  05/18/23 0403  GLUCAP 171* 129* 138* 126* 144*   D-Dimer: No results for input(s): "DDIMER" in the last 72 hours. Hgb A1c: No results for input(s): "HGBA1C" in the last 72 hours. Lipid Profile: No results for input(s): "CHOL", "HDL", "LDLCALC", "TRIG", "CHOLHDL", "LDLDIRECT" in the last 72 hours. Thyroid function studies: No results for input(s): "TSH", "T4TOTAL", "T3FREE", "THYROIDAB" in the last 72 hours.  Invalid input(s): "FREET3" Anemia work up: No results for input(s): "VITAMINB12", "FOLATE", "FERRITIN", "TIBC", "IRON", "RETICCTPCT" in the last 72 hours. Sepsis Labs: Recent Labs  Lab 05/14/23 0412 05/15/23 0502 05/16/23 0528 05/17/23 0455  WBC 11.6* 10.9* 8.3 8.7   Microbiology Recent Results (from the past 240 hours)  MRSA Next Gen by PCR, Nasal     Status: None   Collection Time: 05/11/23  8:12 PM   Specimen: Nasal Mucosa; Nasal Swab  Result Value Ref Range Status   MRSA by PCR Next Gen NOT DETECTED NOT DETECTED Final    Comment: (NOTE) The GeneXpert MRSA Assay (FDA approved for NASAL specimens only), is one component of a comprehensive MRSA colonization surveillance program. It is not intended to diagnose MRSA infection nor to guide or monitor treatment for MRSA infections. Test performance is not FDA approved in patients less than 28 years old. Performed at [facility], [address], [place], [place] [address]   Culture, blood (Routine X 2) w Reflex to ID Panel     Status: None (Preliminary result)   Collection Time: 05/14/23  5:12 PM   Specimen: BLOOD  Result Value Ref Range Status   Specimen Description BLOOD SITE NOT SPECIFIED  Final   Special Requests   Final    BOTTLES DRAWN AEROBIC AND ANAEROBIC Blood Culture adequate volume   Culture   Final    NO GROWTH 4 DAYS Performed at [facility], [address] [place], [place] [address]    Report Status PENDING  Incomplete  Culture, blood (Routine X 2) w Reflex to ID Panel     Status: None  (Preliminary result)   Collection Time: 05/14/23  5:20 PM   Specimen: BLOOD  Result Value Ref Range Status   Specimen Description BLOOD SITE NOT SPECIFIED  Final   Special Requests   Final    BOTTLES DRAWN AEROBIC AND ANAEROBIC Blood Culture adequate volume   Culture   Final  NO GROWTH 4 DAYS Performed at [facility], [address], [place], [place] [address]    Report Status PENDING  Incomplete  Respiratory (~20 pathogens) panel by PCR     Status: None   Collection Time: 05/15/23 12:45 PM  Result Value Ref Range Status   Adenovirus NOT DETECTED NOT DETECTED Final   Coronavirus 229E NOT DETECTED NOT DETECTED Final    Comment: (NOTE) The Coronavirus on the Respiratory Panel, DOES NOT test for the novel  Coronavirus (2019 nCoV)    Coronavirus HKU1 NOT DETECTED NOT DETECTED Final   Coronavirus NL63 NOT DETECTED NOT DETECTED Final   Coronavirus OC43 NOT DETECTED NOT DETECTED Final   Metapneumovirus NOT DETECTED NOT DETECTED Final   Rhinovirus / Enterovirus NOT DETECTED NOT DETECTED Final   Influenza A NOT DETECTED NOT DETECTED Final   Influenza B NOT DETECTED NOT DETECTED Final   Parainfluenza Virus 1 NOT DETECTED NOT DETECTED Final   Parainfluenza Virus 2 NOT DETECTED NOT DETECTED Final   Parainfluenza Virus 3 NOT DETECTED NOT DETECTED Final   Parainfluenza Virus 4 NOT DETECTED NOT DETECTED Final   Respiratory Syncytial Virus NOT DETECTED NOT DETECTED Final   Bordetella pertussis NOT DETECTED NOT DETECTED Final   Bordetella Parapertussis NOT DETECTED NOT DETECTED Final   Chlamydophila pneumoniae NOT DETECTED NOT DETECTED Final   Mycoplasma pneumoniae NOT DETECTED NOT DETECTED Final    Comment: Performed at [facility], [address] [place], [place] [address]  Expectorated Sputum Assessment w Gram Stain, Rflx to Resp Cult     Status: None   Collection Time: 05/17/23 10:42 AM   Specimen: Sputum  Result Value Ref Range Status   Specimen Description SPUTUM   Final   Special Requests NONE  Final   Sputum evaluation   Final    THIS SPECIMEN IS ACCEPTABLE FOR SPUTUM CULTURE Performed at [facility], [address] [place], [place] [address]    Report Status 05/17/2023 FINAL  Final  Culture, Respiratory w Gram Stain     Status: None (Preliminary result)   Collection Time: 05/17/23 10:42 AM   Specimen: SPU  Result Value Ref Range Status   Specimen Description SPUTUM  Final   Special Requests NONE Reflexed from [id number]  Final   Gram Stain   Final    MODERATE WBC PRESENT, PREDOMINANTLY PMN MODERATE GRAM NEGATIVE RODS MODERATE GRAM POSITIVE COCCI Performed at [facility], [address] [place], [place] [address]    Culture PENDING  Incomplete   Report Status PENDING  Incomplete  MRSA Next Gen by PCR, Nasal     Status: None   Collection Time: 05/17/23 10:51 AM   Specimen: Nasal Mucosa; Nasal Swab  Result Value Ref Range Status   MRSA by PCR Next Gen NOT DETECTED NOT DETECTED Final    Comment: (NOTE) The GeneXpert MRSA Assay (FDA approved for NASAL specimens only), is one component of a comprehensive MRSA colonization surveillance program. It is not intended to diagnose MRSA infection nor to guide or monitor treatment for MRSA infections. Test performance is not FDA approved in patients less than 30 years old. Performed at [facility], [address], [place], [place] [address]   Culture, blood (Routine X 2) w Reflex to ID Panel     Status: None (Preliminary result)   Collection Time: 05/17/23  4:04 PM   Specimen: BLOOD  Result Value Ref Range Status   Specimen Description BLOOD SITE NOT  SPECIFIED  Final   Special Requests   Final    BOTTLES DRAWN AEROBIC AND ANAEROBIC Blood Culture results may not be optimal due to an inadequate volume of blood received in culture bottles   Culture   Final    NO GROWTH < 24 HOURS Performed at [facility], [address], [place], [place] [address]    Report Status  PENDING  Incomplete  Culture, blood (Routine X 2) w Reflex to ID Panel     Status: None (Preliminary result)   Collection Time: 05/17/23  4:04 PM   Specimen: BLOOD  Result Value Ref Range Status   Specimen Description BLOOD SITE NOT SPECIFIED  Final   Special Requests   Final    BOTTLES DRAWN AEROBIC AND ANAEROBIC Blood Culture results may not be optimal due to an inadequate volume of blood received in culture bottles   Culture   Final    NO GROWTH < 24 HOURS Performed at [facility], [address] [place], [place] [address]    Report Status PENDING  Incomplete     Medications:    amLODipine  10 mg Per Tube Daily   bromocriptine  1.25 mg Per Tube QID   Chlorhexidine Gluconate Cloth  6 each Topical Daily   donepezil  5 mg Per Tube QHS   feeding supplement (PROSource TF20)  60 mL Per Tube BID   fiber supplement (BANATROL TF)  60 mL Per Tube BID   guaiFENesin  15 mL Per Tube Q6H   heparin injection (subcutaneous)  5,000 Units Subcutaneous Q12H   insulin aspart  0-6 Units Subcutaneous Q4H   multivitamin with minerals  1 tablet Per Tube Daily   mouth rinse  15 mL Mouth Rinse 4 times per day   pantoprazole (PROTONIX) IV  40 mg Intravenous QHS   thiamine  100 mg Per Tube Daily   Continuous Infusions:  sodium chloride 75 mL/hr at 05/18/23 0600   ceFEPime (MAXIPIME) IV Stopped (05/18/23 0029)   clevidipine     feeding supplement (OSMOLITE 1.5 CAL) 40 mL/hr at 05/18/23 0600   metronidazole Stopped (05/18/23 0338)      LOS: 7 days   Marinda Elk  [facility]  05/18/2023, 6:50 AM

## 2023-05-18 NOTE — Plan of Care (Signed)
 Palliative:  I went to visit for palliative consultation. In conference with RN I was told that son was not interested in speaking with palliative care at this time. I will respect his decision and provide him with some space. I provided my contact card to RN to give to son so he can reach out to me for support or conversation at any time. I will be on service through 3/1 this week. RN will also reiterate purpose of palliative care for support and assistance to discuss goals of care to ensure that we are taking the best care of his father we can during these difficult circumstances for he and his family. I will be available as desired by family for further support and discussion.   No charge  Yong Channel, NP Palliative Medicine Team Pager [phone] (Please see [email or web address] for schedule) Team Phone [phone]

## 2023-05-18 NOTE — Progress Notes (Signed)
 0730 patient alert following commands CHG and bath completed 0900 granddaughter at bedside MD updated her on plan of care

## 2023-05-19 ENCOUNTER — Other Ambulatory Visit: Payer: Self-pay | Admitting: Internal Medicine

## 2023-05-19 DIAGNOSIS — I6389 Other cerebral infarction: Secondary | ICD-10-CM | POA: Diagnosis not present

## 2023-05-19 DIAGNOSIS — I610 Nontraumatic intracerebral hemorrhage in hemisphere, subcortical: Secondary | ICD-10-CM | POA: Diagnosis not present

## 2023-05-19 DIAGNOSIS — F03A0 Unspecified dementia, mild, without behavioral disturbance, psychotic disturbance, mood disturbance, and anxiety: Secondary | ICD-10-CM

## 2023-05-19 LAB — CBC
HCT: 29.7 % — ABNORMAL LOW (ref 39.0–52.0)
Hemoglobin: 10.2 g/dL — ABNORMAL LOW (ref 13.0–17.0)
MCH: 28.5 pg (ref 26.0–34.0)
MCHC: 34.3 g/dL (ref 30.0–36.0)
MCV: 83 fL (ref 80.0–100.0)
Platelets: 239 10*3/uL (ref 150–400)
RBC: 3.58 MIL/uL — ABNORMAL LOW (ref 4.22–5.81)
RDW: 15.1 % (ref 11.5–15.5)
WBC: 8.8 10*3/uL (ref 4.0–10.5)
nRBC: 0 % (ref 0.0–0.2)

## 2023-05-19 LAB — CULTURE, BLOOD (ROUTINE X 2)
Culture: NO GROWTH
Culture: NO GROWTH
Special Requests: ADEQUATE
Special Requests: ADEQUATE

## 2023-05-19 LAB — GLUCOSE, CAPILLARY
Glucose-Capillary: 133 mg/dL — ABNORMAL HIGH (ref 70–99)
Glucose-Capillary: 144 mg/dL — ABNORMAL HIGH (ref 70–99)
Glucose-Capillary: 122 mg/dL — ABNORMAL HIGH (ref 70–99)
Glucose-Capillary: 121 mg/dL — ABNORMAL HIGH (ref 70–99)
Glucose-Capillary: 124 mg/dL — ABNORMAL HIGH (ref 70–99)
Glucose-Capillary: 123 mg/dL — ABNORMAL HIGH (ref 70–99)

## 2023-05-19 LAB — BASIC METABOLIC PANEL
Anion gap: 11 (ref 5–15)
BUN: 39 mg/dL — ABNORMAL HIGH (ref 8–23)
CO2: 25 mmol/L (ref 22–32)
Calcium: 8.9 mg/dL (ref 8.9–10.3)
Chloride: 105 mmol/L (ref 98–111)
Creatinine, Ser: 0.82 mg/dL (ref 0.61–1.24)
GFR, Estimated: >60 mL/min (ref >60)
Glucose, Bld: 131 mg/dL — ABNORMAL HIGH (ref 70–99)
Potassium: 4.6 mmol/L (ref 3.5–5.1)
Sodium: 141 mmol/L (ref 135–145)

## 2023-05-19 NOTE — Care Management Important Message (Signed)
 Important Message  Patient Details  Name: Derek Yates MRN: [id number] Date of Birth: [birth year]   Important Message Given:  Yes - Medicare IM     Sherilyn Banker 05/19/2023, 12:33 PM

## 2023-05-19 NOTE — Progress Notes (Addendum)
 STROKE TEAM PROGRESS NOTE   INTERIM HISTORY/SUBJECTIVE  No family at the bedside.  Patient is receiving vest therapy.  He is more lethargic today His eyes are closed he does open them with some stimulation but requires constant stimulation to stay awake, he does follow some commands, he did state his name with very hypophonic speech, eyes are midline, no blink to threat on the left, left hemibody appears to be weaker than right, he does withdraw up on bilateral lowers however more on right than left    OBJECTIVE  CBC    Component Value Date/Time   WBC 8.8 05/19/2023 0501   RBC 3.58 (L) 05/19/2023 0501   HGB 10.2 (L) 05/19/2023 0501   HGB 13.3 02/03/2023 0852   HCT 29.7 (L) 05/19/2023 0501   HCT 39.9 02/03/2023 0852   PLT 239 05/19/2023 0501   PLT 271 02/03/2023 0852   MCV 83.0 05/19/2023 0501   MCV 87 02/03/2023 0852   MCH 28.5 05/19/2023 0501   MCHC 34.3 05/19/2023 0501   RDW 15.1 05/19/2023 0501   RDW 13.2 02/03/2023 0852   LYMPHSABS 2.8 05/11/2023 1344   LYMPHSABS 2.6 02/03/2023 0852   MONOABS 0.8 05/11/2023 1344   EOSABS 0.1 05/11/2023 1344   EOSABS 0.3 02/03/2023 0852   BASOSABS 0.0 05/11/2023 1344   BASOSABS 0.1 02/03/2023 0852    BMET    Component Value Date/Time   NA 141 05/19/2023 0501   NA 143 02/03/2023 0852   K 4.6 05/19/2023 0501   CL 105 05/19/2023 0501   CO2 25 05/19/2023 0501   GLUCOSE 131 (H) 05/19/2023 0501   BUN 39 (H) 05/19/2023 0501   BUN 15 02/03/2023 0852   CREATININE 0.82 05/19/2023 0501   CALCIUM 8.9 05/19/2023 0501   EGFR 76 02/03/2023 0852   GFRNONAA >60 05/19/2023 0501    IMAGING past 24 hours No results found.    Vitals:   05/19/23 0300 05/19/23 0500 05/19/23 0800 05/19/23 1200  BP:    (!) 113/57  Pulse:    81  Resp:   (!) 26 (!) 26  Temp: 98.1 F (36.7 C)  99.3 F (37.4 C) 98.3 F (36.8 C)  TempSrc: Oral  Oral Oral  SpO2:    96%  Weight:  63 kg       PHYSICAL EXAM General: Lethargic critically ill elderly  male Psych: Unable to assess CV: Regular rate and rhythm on monitor Respiratory:  Regular, unlabored respirations on room air. Mild rhonchi on right side.  NEURO:  Mental Status: more drowsy today but will arouse. Oriented to name.  Poor attention  Follows commands. Sticks tongue out. Showed me 2 fingers. Speech/Language: Hypophonic speech speech is without dysarthria or aphasia.    Cranial Nerves:  II: PERRL.  No blink to threat on left III, IV, VI: EOMI. Eyelids elevate symmetrically.  V: Sensation is intact to light touch and symmetrical to face.  VII: Left facial droop VIII: hearing intact to voice. IX, X: Palate elevates symmetrically. Phonation is normal.  NF:[id number] shrug 5/5. XII: tongue is midline without fasciculations. Quiet voice.  Motor: Left arm weakness 3/5, bilateral lower extremities not antigravity, can wiggle toes, withdraws on both lower extremities right greater than left Tone: is normal and bulk is normal Sensation- Intact to light touch bilaterally. Extinction absent to light touch to DSS.   Coordination: FTN intact bilaterally, HKS: no ataxia in BLE.No drift.  Gait- deferred     ASSESSMENT/PLAN  [name] is a  83 y.o. male with history of hypertension, CVA, recurrent UTIs, dementia who presented to [facility] as a code stroke due to speech deficits, left-sided deficits.  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 Intraventricular penetration with the amount of intraventricular blood being similar. CTA not pursued as it will not change mangement MRI ICH centered at the  right caudate head with IVH and mild communicating hydrocephalus. 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 Old left frontal, left occipital and right parieto-occipital infarcts. CT repeat  2/21: Roughly 6 mL Right basal ganglia hemorrhage has increased from approximately 4 mL at presentation. Stable moderate volume of IVH. No acute ventriculomegaly.  Mild leftward shift of 3 mm stable CT head repeat 2/22 stable 2D Echo: LVEF 65 to 70%  LDL 69 HgbA1c 5.7 VTE prophylaxis - SCDs No antithrombotic prior to admission, now on No antithrombotic due to ICH and CAA Therapy recommendations: CIR Disposition pending  Advanced CAA MRI showed advanced CAA No antithrombotics Strict BP control less than 140 Recommend no statin at this time  Hypertension Home meds:  nifedipine 30mg  Stable BP goal less than 140 given CAA On amlodipine 10 Strict BP control at home  Hyperlipidemia Home meds:  lipitor 10mg  LDL 66, goal < 70 Recommend no statin given advanced CAA  Aspirin pneumonia Fever Tmax 101.6-> afebrile Leukocytosis WBC 8.7--12.4--8.8 Chest CT 2/25 consistent with aspiration pneumonia On chest PT On cefepime and Flagyl  Dysphagia Patient has post-stroke dysphagia SLP consulted Now n.p.o. Core track placed On tube feeds  Dementia Severe memory loss at baseline Able to walk with assistance at home Home meds including Aricept  Other Stroke Risk Factors Advanced Age  Other Active Problems Recurrent UTIs PTA AKI, Cr 1.40-1.24>0.93--0.82  Hospital day # 8  Gevena Mart DNP, ACNPC-AG  [facility]  ATTENDING NOTE: I reviewed above note and agree with the assessment and plan. Pt was seen and examined.   RT is at the room for chest PT. Pt lying in bed, lethargic, open eyes on voice but not able to keep opening without repetitive stimulation. Nonverbal, did not answer orientation  questions, but followed "showing two fingers" on the right hand, but perseverated with other commands. No gaze palsy, eyes midline, tracking bilaterally slowly with voice, not blinking to visual threat consistently bilaterally, PERRL. Left facial droop. Tongue protrusion not cooperative. LUE drift to bed within 10 sec, RUE no drift, LLE withdraw to pain but less than RLE withdraw. Sensation, coordination not cooperative and gait not tested.   Patient lethargic but neuro stable, afebrile today, leukocytosis has improved.  Creatinine stable.  On chest PT and antibiotics.  DC bromocriptine.  Continue PT and OT.  For detailed assessment and plan, please refer to above/below as I have made changes wherever appropriate.   Marvel Plan, MD PhD Stroke Neurology 05/19/2023 6:27 PM     To contact Stroke Continuity provider, please refer to [email or web address]. After hours, contact General Neurology

## 2023-05-19 NOTE — Progress Notes (Signed)
 [facility] PROGRESS NOTE    Progress Note  Derek Yates  ONG:[id number] DOB: Jan 09, 1941 DOA: 05/11/2023 PCP: [name], MD     Brief Narrative:   Derek Yates is an 83 y.o. 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 Repeated CT scan on 05/13/2023 showed 6 mm basal ganglia hemorrhage which showed increase in size from previous. Further management per neurology. Physical therapy evaluated the patient and recommended inpatient rehab  Fevers possibly due to aspiration pneumonia CT chest on 05/17/2023 showed lower lobes multifocal mucous plugging and patchy airspace infiltrate concerning for aspiration pneumonia Tmax of 100.6. Continue cefepime and Flagyl he is defervescing. Will complete a 10-day course. Continue chest physiotherapy. His leukocytosis has resolved Palliative care was consulted.  Acute respiratory failure with hypoxia due to aspiration pneumonia: Continues to require 2 L of oxygen to keep saturation greater 90%. Continue chest physiotherapy and inhalers.  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 Continue statins.  Acute kidney injury: Likely hemodynamic mediated resolved with IV fluid resuscitation.  Essential hypertension: Continue nifedipine.  Hyperlipidemia: Continue statins.  Dysphagia: Currently n.p.o. with a core  track.  Advance dementia: Severe memory loss  Protein-calorie malnutrition, severe noted   DVT prophylaxis: SCD Family Communication:none Status is: Inpatient Remains inpatient appropriate because: Acute intracranial hemorrhage    Code Status:     Code Status Orders  (From admission, onward)           Start     Ordered   05/17/23 1008  Do not attempt resuscitation (DNR)- Limited -Do Not Intubate (DNI)  (Code Status)  Continuous       Question Answer Comment  If pulseless and not breathing No CPR or chest compressions.   In Pre-Arrest Conditions (Patient Is Breathing and Has A Pulse) Do not intubate. Provide all appropriate non-invasive medical interventions. Avoid ICU transfer unless indicated or required.   Consent: Discussion documented in EHR or advanced directives reviewed      05/17/23 1008           Code Status History     Date Active Date Inactive Code Status Order ID Comments User Context   05/11/2023 2022 05/17/2023 1008 Full Code [id number]  [name], MD Inpatient         IV Access:   Peripheral IV   Procedures and diagnostic studies:   CT CHEST WO CONTRAST Result Date: 05/17/2023 CLINICAL DATA:  Fever of unknown origin. EXAM: CT CHEST WITHOUT CONTRAST TECHNIQUE: Multidetector CT imaging of the chest was performed following the standard protocol without IV contrast. RADIATION DOSE REDUCTION: This exam was performed according to the departmental dose-optimization program which includes automated exposure control, adjustment of the mA and/or kV according to patient size and/or use of iterative reconstruction technique. COMPARISON:  None Available. FINDINGS: Cardiovascular: Heart is normal in size and there is a trace pericardial effusion. Three-vessel coronary artery calcifications are noted. There is atherosclerotic calcification of the aorta without evidence of aneurysm. The pulmonary trunk  is normal in caliber. Mediastinum/Nodes: No  mediastinal or axillary lymphadenopathy bilaterally. Evaluation of the hila is limited due to lack of IV contrast. The thyroid gland is within normal limits. An enteric tube is seen in the esophagus. Lungs/Pleura: There is frothy debris in the distal esophagus extending into the bilateral lower lobe airways. There is bronchial wall thickening with multifocal mucous plugging in the lower lobes with mild patchy airspace disease at the lung bases. No effusion or pneumothorax is seen. Upper Abdomen: Stones are present within the gallbladder. There is a cyst in the upper pole of the left kidney. An enteric tube is seen in the stomach and proximal duodenum. No acute abnormality Musculoskeletal: . degenerative changes are present in the thoracic spine. No acute osseous abnormality is seen. IMPRESSION: 1. Frothy debris in the distal trachea extending into the lower lobe bronchi bilaterally with multifocal mucous plugging and patchy airspace disease at the lung bases, suggesting aspiration pneumonia. 2. Multi-vessel coronary artery calcifications. 3. Aortic atherosclerosis. 4. Cholelithiasis. Electronically Signed   By: Thornell Sartorius M.D.   On: 05/17/2023 12:24     Medical Consultants:   None.   Subjective:    Derek Yates nonverbal today  Objective:    Vitals:   05/18/23 2100 05/18/23 2130 05/19/23 0300 05/19/23 0500  BP: 114/63 122/64    Pulse: 75 84    Resp: (!) 24 (!) 27    Temp:  98.9 F (37.2 C) 98.1 F (36.7 C)   TempSrc:  Axillary Oral   SpO2: 97% 97%    Weight:    63 kg   SpO2: 97 % O2 Flow Rate (L/min): 4 L/min   Intake/Output Summary (Last 24 hours) at 05/19/2023 0739 Last data filed at 05/19/2023 0700 Gross per 24 hour  Intake 1990.15 ml  Output 1290 ml  Net 700.15 ml   Filed Weights   05/17/23 0500 05/18/23 0500 05/19/23 0500  Weight: 58.8 kg 61.8 kg 63 kg    Exam: General exam: In no acute distress. Respiratory system: Good air movement and clear to  auscultation. Cardiovascular system: S1 & S2 heard, RRR. No JVD. Gastrointestinal system: Abdomen is nondistended, soft and nontender.  Central nervous system: Continues to be lethargic minimally responsive but relates he is in no pain. Extremities: No pedal edema. Skin: No rashes, lesions or ulcers Psychiatry: No judgment or insight of medical condition.   Data Reviewed:    Labs: Basic Metabolic Panel: Recent Labs  Lab 05/13/23 1358 05/13/23 1644 05/14/23 8295 05/14/23 1712 05/15/23 0502 05/16/23 6213 05/17/23 0455 05/18/23 0834 05/19/23 0501  NA  --   --  141  --  141 141 142 143 141  K  --   --  4.1  --  4.2 3.9 4.1 4.6 4.6  CL  --   --  102  --  101 102 108 105 105  CO2  --   --  26  --  27 28 29 30 25   GLUCOSE  --   --  160*  --  143* 169* 148* 135* 131*  BUN  --   --  30*  --  29* 30* 36* 34* 39*  CREATININE  --   --  0.97  --  0.94 0.93 0.80 0.79 0.82  CALCIUM  --   --  9.7  --  9.2 9.4 9.2 9.5 8.9  MG 2.4 2.3 2.4 2.4  --   --   --   --   --  PHOS 3.3 2.9 2.2* 2.5  --  3.1  --   --   --    GFR Estimated Creatinine Clearance: 58.7 mL/min (by C-G formula based on SCr of 0.82 mg/dL). Liver Function Tests: No results for input(s): "AST", "ALT", "ALKPHOS", "BILITOT", "PROT", "ALBUMIN" in the last 168 hours.  No results for input(s): "LIPASE", "AMYLASE" in the last 168 hours. No results for input(s): "AMMONIA" in the last 168 hours. Coagulation profile No results for input(s): "INR", "PROTIME" in the last 168 hours.  COVID-19 Labs  No results for input(s): "DDIMER", "FERRITIN", "LDH", "CRP" in the last 72 hours.  Lab Results  Component Value Date   SARSCOV2NAA Not Detected 09/14/2018    CBC: Recent Labs  Lab 05/15/23 0502 05/16/23 0528 05/17/23 0455 05/18/23 0834 05/19/23 0501  WBC 10.9* 8.3 8.7 12.4* 8.8  HGB 12.0* 11.6* 11.7* 11.6* 10.2*  HCT 35.3* 34.1* 33.9* 34.4* 29.7*  MCV 82.7 82.8 82.1 83.1 83.0  PLT 236 258 273 287 239   Cardiac  Enzymes: No results for input(s): "CKTOTAL", "CKMB", "CKMBINDEX", "TROPONINI" in the last 168 hours. BNP (last 3 results) No results for input(s): "PROBNP" in the last 8760 hours. CBG: Recent Labs  Lab 05/18/23 1147 05/18/23 1532 05/18/23 1955 05/18/23 2307 05/19/23 0335  GLUCAP 137* 133* 119* 126* 133*   D-Dimer: No results for input(s): "DDIMER" in the last 72 hours. Hgb A1c: No results for input(s): "HGBA1C" in the last 72 hours. Lipid Profile: No results for input(s): "CHOL", "HDL", "LDLCALC", "TRIG", "CHOLHDL", "LDLDIRECT" in the last 72 hours. Thyroid function studies: No results for input(s): "TSH", "T4TOTAL", "T3FREE", "THYROIDAB" in the last 72 hours.  Invalid input(s): "FREET3" Anemia work up: No results for input(s): "VITAMINB12", "FOLATE", "FERRITIN", "TIBC", "IRON", "RETICCTPCT" in the last 72 hours. Sepsis Labs: Recent Labs  Lab 05/16/23 0528 05/17/23 0455 05/18/23 0834 05/19/23 0501  WBC 8.3 8.7 12.4* 8.8   Microbiology Recent Results (from the past 240 hours)  MRSA Next Gen by PCR, Nasal     Status: None   Collection Time: 05/11/23  8:12 PM   Specimen: Nasal Mucosa; Nasal Swab  Result Value Ref Range Status   MRSA by PCR Next Gen NOT DETECTED NOT DETECTED Final    Comment: (NOTE) The GeneXpert MRSA Assay (FDA approved for NASAL specimens only), is one component of a comprehensive MRSA colonization surveillance program. It is not intended to diagnose MRSA infection nor to guide or monitor treatment for MRSA infections. Test performance is not FDA approved in patients less than 85 years old. Performed at [facility], [address] [place], [place] [address]   Culture, blood (Routine X 2) w Reflex to ID Panel     Status: None   Collection Time: 05/14/23  5:12 PM   Specimen: BLOOD  Result Value Ref Range Status   Specimen Description BLOOD SITE NOT SPECIFIED  Final   Special Requests   Final    BOTTLES DRAWN AEROBIC AND ANAEROBIC Blood  Culture adequate volume   Culture   Final    NO GROWTH 5 DAYS Performed at [facility], [address] [place], [place] [address]    Report Status 05/19/2023 FINAL  Final  Culture, blood (Routine X 2) w Reflex to ID Panel     Status: None   Collection Time: 05/14/23  5:20 PM   Specimen: BLOOD  Result Value Ref Range Status   Specimen Description BLOOD SITE NOT SPECIFIED  Final   Special Requests  Final    BOTTLES DRAWN AEROBIC AND ANAEROBIC Blood Culture adequate volume   Culture   Final    NO GROWTH 5 DAYS Performed at [facility], [address], [place], [place] [address]    Report Status 05/19/2023 FINAL  Final  Respiratory (~20 pathogens) panel by PCR     Status: None   Collection Time: 05/15/23 12:45 PM  Result Value Ref Range Status   Adenovirus NOT DETECTED NOT DETECTED Final   Coronavirus 229E NOT DETECTED NOT DETECTED Final    Comment: (NOTE) The Coronavirus on the Respiratory Panel, DOES NOT test for the novel  Coronavirus (2019 nCoV)    Coronavirus HKU1 NOT DETECTED NOT DETECTED Final   Coronavirus NL63 NOT DETECTED NOT DETECTED Final   Coronavirus OC43 NOT DETECTED NOT DETECTED Final   Metapneumovirus NOT DETECTED NOT DETECTED Final   Rhinovirus / Enterovirus NOT DETECTED NOT DETECTED Final   Influenza A NOT DETECTED NOT DETECTED Final   Influenza B NOT DETECTED NOT DETECTED Final   Parainfluenza Virus 1 NOT DETECTED NOT DETECTED Final   Parainfluenza Virus 2 NOT DETECTED NOT DETECTED Final   Parainfluenza Virus 3 NOT DETECTED NOT DETECTED Final   Parainfluenza Virus 4 NOT DETECTED NOT DETECTED Final   Respiratory Syncytial Virus NOT DETECTED NOT DETECTED Final   Bordetella pertussis NOT DETECTED NOT DETECTED Final   Bordetella Parapertussis NOT DETECTED NOT DETECTED Final   Chlamydophila pneumoniae NOT DETECTED NOT DETECTED Final   Mycoplasma pneumoniae NOT DETECTED NOT DETECTED Final    Comment: Performed at [facility], [address] [place], [place] [address]  Expectorated Sputum Assessment w Gram Stain, Rflx to Resp Cult     Status: None   Collection Time: 05/17/23 10:42 AM   Specimen: Sputum  Result Value Ref Range Status   Specimen Description SPUTUM  Final   Special Requests NONE  Final   Sputum evaluation   Final    THIS SPECIMEN IS ACCEPTABLE FOR SPUTUM CULTURE Performed at [facility], [address] [place], [place] [address]    Report Status 05/17/2023 FINAL  Final  Culture, Respiratory w Gram Stain     Status: None (Preliminary result)   Collection Time: 05/17/23 10:42 AM   Specimen: SPU  Result Value Ref Range Status   Specimen Description SPUTUM  Final   Special Requests NONE Reflexed from [id number]  Final   Gram Stain   Final    MODERATE WBC PRESENT, PREDOMINANTLY PMN MODERATE GRAM NEGATIVE RODS MODERATE GRAM POSITIVE COCCI Performed at [facility], [address], [place], [place] [address]    Culture PENDING  Incomplete   Report Status PENDING  Incomplete  MRSA Next Gen by PCR, Nasal     Status: None   Collection Time: 05/17/23 10:51 AM   Specimen: Nasal Mucosa; Nasal Swab  Result Value Ref Range Status   MRSA by PCR Next Gen NOT DETECTED NOT DETECTED Final    Comment: (NOTE) The GeneXpert MRSA Assay (FDA approved for NASAL specimens only), is one component of a comprehensive MRSA colonization surveillance program. It is not intended to diagnose MRSA infection nor to guide or monitor treatment for MRSA infections. Test performance is not FDA approved in patients less than 80 years old. Performed at [facility], [address], [place], [place] [address]   Gastrointestinal Panel by PCR , Stool     Status: None   Collection Time: 05/17/23 11:24 AM  Specimen: Stool  Result Value Ref Range Status   Campylobacter species NOT DETECTED NOT DETECTED Final   Plesimonas shigelloides NOT DETECTED NOT DETECTED Final   Salmonella species NOT DETECTED NOT DETECTED Final    Yersinia enterocolitica NOT DETECTED NOT DETECTED Final   Vibrio species NOT DETECTED NOT DETECTED Final   Vibrio cholerae NOT DETECTED NOT DETECTED Final   Enteroaggregative E coli (EAEC) NOT DETECTED NOT DETECTED Final   Enteropathogenic E coli (EPEC) NOT DETECTED NOT DETECTED Final   Enterotoxigenic E coli (ETEC) NOT DETECTED NOT DETECTED Final   Shiga like toxin producing E coli (STEC) NOT DETECTED NOT DETECTED Final   Shigella/Enteroinvasive E coli (EIEC) NOT DETECTED NOT DETECTED Final   Cryptosporidium NOT DETECTED NOT DETECTED Final   Cyclospora cayetanensis NOT DETECTED NOT DETECTED Final   Entamoeba histolytica NOT DETECTED NOT DETECTED Final   Giardia lamblia NOT DETECTED NOT DETECTED Final   Adenovirus F40/41 NOT DETECTED NOT DETECTED Final   Astrovirus NOT DETECTED NOT DETECTED Final   Norovirus GI/GII NOT DETECTED NOT DETECTED Final   Rotavirus A NOT DETECTED NOT DETECTED Final   Sapovirus (I, II, IV, and V) NOT DETECTED NOT DETECTED Final    Comment: Performed at [facility], [address] [place], [place] [address]  Culture, blood (Routine X 2) w Reflex to ID Panel     Status: None (Preliminary result)   Collection Time: 05/17/23  4:04 PM   Specimen: BLOOD  Result Value Ref Range Status   Specimen Description BLOOD SITE NOT SPECIFIED  Final   Special Requests   Final    BOTTLES DRAWN AEROBIC AND ANAEROBIC Blood Culture results may not be optimal due to an inadequate volume of blood received in culture bottles   Culture   Final    NO GROWTH 2 DAYS Performed at [facility], [address] [place], [place] [address]    Report Status PENDING  Incomplete  Culture, blood (Routine X 2) w Reflex to ID Panel     Status: None (Preliminary result)   Collection Time: 05/17/23  4:04 PM   Specimen: BLOOD  Result Value Ref Range Status   Specimen Description BLOOD SITE NOT SPECIFIED  Final   Special Requests   Final    BOTTLES DRAWN AEROBIC AND ANAEROBIC  Blood Culture results may not be optimal due to an inadequate volume of blood received in culture bottles   Culture   Final    NO GROWTH 2 DAYS Performed at [facility], [address], [place], [place] [address]    Report Status PENDING  Incomplete  Culture, OB Urine     Status: None   Collection Time: 05/17/23  4:14 PM   Specimen: Urine, Random  Result Value Ref Range Status   Specimen Description URINE, RANDOM  Final   Special Requests NONE  Final   Culture   Final    NO GROWTH NO GROUP B STREP (S.AGALACTIAE) ISOLATED Performed at [facility], [address] [place], [place] [address]    Report Status 05/18/2023 FINAL  Final     Medications:    amLODipine  10 mg Per Tube Daily   bromocriptine  1.25 mg Per Tube QID   Chlorhexidine Gluconate Cloth  6 each Topical Daily   feeding supplement (PROSource TF20)  60 mL Per Tube BID   fiber supplement (BANATROL TF)  60 mL Per Tube BID   guaiFENesin  15 mL Per Tube Q6H  heparin injection (subcutaneous)  5,000 Units Subcutaneous Q12H   insulin aspart  0-6 Units Subcutaneous Q4H   multivitamin with minerals  1 tablet Per Tube Daily   mouth rinse  15 mL Mouth Rinse 4 times per day   pantoprazole (PROTONIX) IV  40 mg Intravenous QHS   thiamine  100 mg Per Tube Daily   Continuous Infusions:  ceFEPime (MAXIPIME) IV 2 g (05/18/23 2303)   clevidipine     feeding supplement (OSMOLITE 1.5 CAL) 1,000 mL (05/19/23 0504)   metronidazole 500 mg (05/19/23 0118)      LOS: 8 days   Marinda Elk  [facility]  05/19/2023, 7:39 AM

## 2023-05-19 NOTE — Progress Notes (Signed)
 Occupational Therapy Treatment Patient Details Name: Derek Yates MRN: [id number] DOB: 01-23-1941 Today's Date: 05/19/2023   History of present illness 83 yo male presents to [facility] on 2/19 as code stroke for speech deficits, L weakness. 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   OT comments  Patient continues to struggle to participate with active rehab due to decreased level of alertness.  Stedy Lift used for bed to chair this date with heavy +2 assist.  POC reviewed and goals adjusted based on current status.  OT will continue efforts in the acute setting and Patient will benefit from continued inpatient follow up therapy, <3 hours/day for rehab trial prior to determining eventual discharge level.  Hopefully as LOA improves his participation will improve.        If plan is discharge home, recommend the following:  Assist for transportation;A lot of help with bathing/dressing/bathroom;Two people to help with walking and/or transfers;Assistance with feeding   Equipment Recommendations  None recommended by OT    Recommendations for Other Services      Precautions / Restrictions Precautions Precautions: Fall Recall of Precautions/Restrictions: Impaired Precaution/Restrictions Comments: Rectal tube, condom cath, NG Feeding tube Restrictions Weight Bearing Restrictions Per Provider Order: No       Mobility Bed Mobility Overal bed mobility: Needs Assistance Bed Mobility: Supine to Sit     Supine to sit: Total assist, HOB elevated, +2 for physical assistance       Patient Response: Cooperative  Transfers Overall transfer level: Needs assistance Equipment used: 2 person hand held assist Transfers: Sit to/from Stand, Bed to chair/wheelchair/BSC Sit to Stand: Max assist, +2 physical assistance Stand pivot transfers: Total assist           Transfer via Lift Equipment: Stedy   Balance Overall  balance assessment: Needs assistance Sitting-balance support: Feet supported, Bilateral upper extremity supported Sitting balance-Leahy Scale: Zero Sitting balance - Comments: max assist to attempt to maintain upright, heavy posterior bias and difficult to correct Postural control: Posterior lean Standing balance support: Bilateral upper extremity supported Standing balance-Leahy Scale: Poor                             ADL either performed or assessed with clinical judgement   ADL   Eating/Feeding: NPO       Upper Body Bathing: Total assistance;Bed level       Upper Body Dressing : Total assistance;Sitting       Toilet Transfer: Total assistance   Toileting- Clothing Manipulation and Hygiene: Total assistance;Bed level              Extremity/Trunk Assessment Upper Extremity Assessment Upper Extremity Assessment: Right hand dominant RUE Deficits / Details: Patient was squeezing fingers of PT to command, difficult with release, very rigid and continues to resist movement. RUE Coordination: decreased fine motor;decreased gross motor LUE Deficits / Details: Patient was squeezing fingers of PT to command, difficult with release, very rigid and continues to resist movement. LUE Coordination: decreased fine motor;decreased gross motor   Lower Extremity Assessment Lower Extremity Assessment: Defer to PT evaluation   Cervical / Trunk Assessment Cervical / Trunk Assessment: Kyphotic    Vision   Vision Assessment?: Vision impaired- to be further tested in functional context Additional Comments: eyes closed during majority of treatment   Perception Perception Perception: Not tested   Praxis Praxis Praxis: Not tested  Communication Communication Communication: Impaired Factors Affecting Communication: Difficulty expressing self;Reduced clarity of speech   Cognition Arousal: Obtunded Behavior During Therapy: Flat affect Cognition: Difficult to  assess Difficult to assess due to: Level of arousal                             Following commands: Impaired        Cueing   Cueing Techniques: Verbal cues, Tactile cues  Exercises      Shoulder Instructions       General Comments  HR 104 with EOB sitting.    Pertinent Vitals/ Pain       Pain Assessment Pain Assessment: Faces Faces Pain Scale: No hurt Pain Intervention(s): Monitored during session                                                          Frequency  Min 1X/week        Progress Toward Goals  OT Goals(current goals can now be found in the care plan section)  Progress towards OT goals: Goals drowngraded-see care plan  Acute Rehab OT Goals OT Goal Formulation: Patient unable to participate in goal setting Time For Goal Achievement: 05/27/23 Potential to Achieve Goals: Fair  Plan      Co-evaluation    PT/OT/SLP Co-Evaluation/Treatment: Yes Reason for Co-Treatment: Necessary to address cognition/behavior during functional activity;For patient/therapist safety;Complexity of the patient's impairments (multi-system involvement)   OT goals addressed during session: ADL's and self-care      AM-PAC OT "6 Clicks" Daily Activity     Outcome Measure   Help from another person eating meals?: Total Help from another person taking care of personal grooming?: Total Help from another person toileting, which includes using toliet, bedpan, or urinal?: Total Help from another person bathing (including washing, rinsing, drying)?: Total Help from another person to put on and taking off regular upper body clothing?: Total Help from another person to put on and taking off regular lower body clothing?: Total 6 Click Score: 6    End of Session Equipment Utilized During Treatment: Gait belt;Other (comment) (Steady)  OT Visit Diagnosis: Unsteadiness on feet (R26.81);Other symptoms and signs involving cognitive function;Muscle  weakness (generalized) (M62.81)   Activity Tolerance Patient limited by fatigue;Patient limited by lethargy;Treatment limited secondary to medical complications (Comment)   Patient Left in chair;with call bell/phone within reach;with chair alarm set;with family/visitor present   Nurse Communication Mobility status        Time: 1610-9604 OT Time Calculation (min): 23 min  Charges: OT General Charges $OT Visit: 1 Visit OT Treatments $Therapeutic Activity: 8-22 mins  05/19/2023  RP, OTR/L  Acute Rehabilitation Services  Office:  [phone]   Suzanna Obey 05/19/2023, 11:53 AM

## 2023-05-19 NOTE — Progress Notes (Signed)
 Nutrition Brief Note  Continue to follow patient s/p transfer to 4NP unit. Continues on TF regimen initiated while admitted to ICU. Fever trending down. Continues 10-day course of ABX for suspected aspiration PNA. Continues with significant congestion and secretions. He is receiving vest therapy.   2/19 admitted w/ R caudate bleed 2/21 - s/p cortrak placement; tip gastric  2/22 - bedside swallow: NPO, CT head, CXR 2/25 - CT of chest: PNA 2/26 - transfer to 4NP  He continues to struggle with rehab 2/2 decreased level of alertness. Speech in today for treatment. Continuing to recommend NPO w/ PRN spoon sips of water after oral care w/ full supervision from RN. Awaiting increased alertness and consistent initiation of swallow to conduct MBSS.   Tolerating TF regimen with stool output  and UOP yesterday. No noted N/V. Continues on Banatrol for bulking of stool.  Admit Weight: 61.9kg Current Weight: 63kg  Weight stable with slight trend up since admission. Continuing to monitor.   INTERVENTION:  Continue tube feeding via Cortrak tube: Osmolite 1.5 at 40 ml/h (960 ml per day) Prosource TF20 60 ml BID   Provides 1600 kcal, 100 gm protein, 729 ml free water daily   MVI with minerals daily via tube  Banatrol BID, each packet provides 5g soluble fiber  Myrtie Cruise MS, RD, LDN Registered Dietitian Clinical Nutrition RD Inpatient Contact Info in Amion

## 2023-05-19 NOTE — Plan of Care (Signed)

## 2023-05-19 NOTE — Progress Notes (Signed)
 Speech Language Pathology Treatment: Dysphagia  Patient Details Name: Derek Yates MRN: [id number] DOB: 08-Feb-1941 Today's Date: 05/19/2023 Time: 1320-1340 SLP Time Calculation (min) (ACUTE ONLY): 20 min  Assessment / Plan / Recommendation Clinical Impression  Patient seen by SLP for skilled treatment of dysphagia with son present. Patient was alert, but continues to easily fatigue. He was sitting in recliner chair. SLP performed oral care via suction toothbrush and Yankauer. Cued and spontaneous coughing was persistently congested but with times of patient being able to mobilize secretions enough that SLP able to suction out. SLP administered small controlled sips of water via cup, but patient started to swish in mouth and would not swallow. His son did state that patient is used to swishing with water from cup and he suggested we try spoon sips. After swishing and SLP oral suctioning, patient appeared to be chewing on something. He then spit out a piece of dried secretion. With spoon sips of water, patient still exhibited oral holding and required cues to swallow, however delay was significantly less than with cup sips. Although congested sounding cough did follow sips, frequency and intensity of cough was not comparatively different from cough prior to PO's. SLP is recommending continue NPO status but allow PRN spoon sips of water after oral care with full supervision from RN, SLP, and patient's son. Son asked when we would be doing another swallow test. SLP informed him that when patient is alert, consistently initiating a swallow, we will likely assess his swallow objectively via an MBS. SLP will continue to follow.    HPI HPI: Patient is an 83 y.o. male with PMH: dementia, recurrent UTI's, CVA, HTN. He presented to the hospital on 05/11/23 as a code stroke with speech deficits and left sided weakness. 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 Patient has been NPO; Cortrak placed 2/21. He failed [facility] swallow screen with RN.      SLP Plan  Continue with current plan of care      Recommendations for follow up therapy are one component of a multi-disciplinary discharge planning process, led by the attending physician.  Recommendations may be updated based on patient status, additional functional criteria and insurance authorization.    Recommendations  Diet recommendations: NPO Medication Administration: Via alternative means Postural Changes and/or Swallow Maneuvers: Seated upright 90 degrees                  Oral care QID;Oral care prior to ice chip/H20   Frequent or constant Supervision/Assistance Dysphagia, unspecified (R13.10)     Continue with current plan of care     Angela Nevin, MA, CCC-SLP Speech Therapy

## 2023-05-19 NOTE — Progress Notes (Addendum)
 Physical Therapy Treatment Patient Details Name: Derek Yates MRN: [id number] DOB: [birth year] Today's Date: 05/19/2023   History of Present Illness 83 yo male presents to [facility] on 2/19 as code stroke for speech deficits, L weakness. 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    PT Comments  Pt able to follow a few one step commands ~10%, but overall remains limited due to lethargy and decreased alertness. Session focused on sitting balance, promoting arousal, and transfer training. Utilized Stedy to transition from bed to chair with heavy two person assist. VSS throughout. Pt displays significantly weak core and cervical musculature. Patient will benefit from continued inpatient follow up therapy, <3 hours/day.   If plan is discharge home, recommend the following: Two people to help with walking and/or transfers;Two people to help with bathing/dressing/bathroom   Can travel by private vehicle     No  Equipment Recommendations  Wheelchair (measurements PT);Wheelchair cushion (measurements PT);Hospital bed;Hoyer lift    Recommendations for Other Services       Precautions / Restrictions Precautions Precautions: Fall Recall of Precautions/Restrictions: Impaired Precaution/Restrictions Comments: Rectal tube, condom cath, NG Feeding tube Restrictions Weight Bearing Restrictions Per Provider Order: No     Mobility  Bed Mobility Overal bed mobility: Needs Assistance Bed Mobility: Rolling, Sidelying to Sit Rolling: Total assist, +2 for physical assistance Sidelying to sit: Total assist, +2 for physical assistance       General bed mobility comments: Pt able to minimally bend R knee, verbal cues for reaching with R hand and turning head to left to facilitate roll, however, pt unable to execute. TotalA for bringing BLE's off edge of bed and trunk to upright. Use of bed pad to scoot hips forward     Transfers Overall transfer level: Needs assistance Equipment used: Ambulation equipment used Transfers: Sit to/from Stand, Bed to chair/wheelchair/BSC Sit to Stand: Max assist, +2 physical assistance Stand pivot transfers: Total assist         General transfer comment: Hand over hand guidance, heavy assist to boost to standing position. Tendency for forward trunk/hip flexion with kyphotic posture and forward head Transfer via Lift Equipment: Stedy  Ambulation/Gait                   Stairs             Wheelchair Mobility     Tilt Bed    Modified Rankin (Stroke Patients Only)       Balance Overall balance assessment: Needs assistance Sitting-balance support: Feet supported, Bilateral upper extremity supported Sitting balance-Leahy Scale: Poor Sitting balance - Comments: Posterior bias; use of R hand on foot board for support   Standing balance support: Bilateral upper extremity supported Standing balance-Leahy Scale: Zero                              Communication Communication Communication: Impaired Factors Affecting Communication: Difficulty expressing self;Reduced clarity of speech  Cognition Arousal: Obtunded Behavior During Therapy: Flat affect                             Following commands: Impaired      Cueing Cueing Techniques: Verbal cues, Tactile cues  Exercises      General Comments        Pertinent Vitals/Pain Pain Assessment Pain Assessment: Faces Faces Pain Scale:  No hurt    Home Living                          Prior Function            PT Goals (current goals can now be found in the care plan section) Acute Rehab PT Goals Potential to Achieve Goals: Fair    Frequency    Min 1X/week      PT Plan      Co-evaluation PT/OT/SLP Co-Evaluation/Treatment: Yes Reason for Co-Treatment: Necessary to address cognition/behavior during functional activity;For patient/therapist  safety;Complexity of the patient's impairments (multi-system involvement) PT goals addressed during session: Mobility/safety with mobility;Balance OT goals addressed during session: ADL's and self-care      AM-PAC PT "6 Clicks" Mobility   Outcome Measure  Help needed turning from your back to your side while in a flat bed without using bedrails?: Total Help needed moving from lying on your back to sitting on the side of a flat bed without using bedrails?: Total Help needed moving to and from a bed to a chair (including a wheelchair)?: Total Help needed standing up from a chair using your arms (e.g., wheelchair or bedside chair)?: Total Help needed to walk in hospital room?: Total Help needed climbing 3-5 steps with a railing? : Total 6 Click Score: 6    End of Session Equipment Utilized During Treatment: Oxygen Activity Tolerance: Patient limited by lethargy Patient left: in chair;with call bell/phone within reach;with chair alarm set Nurse Communication: Mobility status PT Visit Diagnosis: Other abnormalities of gait and mobility (R26.89);Muscle weakness (generalized) (M62.81)     Time: 1610-9604 PT Time Calculation (min) (ACUTE ONLY): 28 min  Charges:    $Therapeutic Activity: 23-37 mins PT General Charges $$ ACUTE PT VISIT: 1 Visit                    Lillia Pauls, PT, DPT Acute Rehabilitation Services Office [phone]    [name] 05/19/2023, 12:45 PM

## 2023-05-19 NOTE — Progress Notes (Signed)
 Chest vest is not available currently, also CPT not given at this time d/t RN administering meds currently.  RT will locate a chest vest for future treatments.

## 2023-05-20 DIAGNOSIS — F0390 Unspecified dementia without behavioral disturbance: Secondary | ICD-10-CM

## 2023-05-20 DIAGNOSIS — I619 Nontraumatic intracerebral hemorrhage, unspecified: Secondary | ICD-10-CM | POA: Diagnosis not present

## 2023-05-20 DIAGNOSIS — G910 Communicating hydrocephalus: Secondary | ICD-10-CM

## 2023-05-20 DIAGNOSIS — I6389 Other cerebral infarction: Secondary | ICD-10-CM | POA: Diagnosis not present

## 2023-05-20 LAB — CBC
HCT: 31.4 % — ABNORMAL LOW (ref 39.0–52.0)
Hemoglobin: 10.8 g/dL — ABNORMAL LOW (ref 13.0–17.0)
MCH: 28.3 pg (ref 26.0–34.0)
MCHC: 34.4 g/dL (ref 30.0–36.0)
MCV: 82.4 fL (ref 80.0–100.0)
Platelets: 319 10*3/uL (ref 150–400)
RBC: 3.81 MIL/uL — ABNORMAL LOW (ref 4.22–5.81)
RDW: 15.2 % (ref 11.5–15.5)
WBC: 7.8 10*3/uL (ref 4.0–10.5)
nRBC: 0 % (ref 0.0–0.2)

## 2023-05-20 LAB — GLUCOSE, CAPILLARY
Glucose-Capillary: 110 mg/dL — ABNORMAL HIGH (ref 70–99)
Glucose-Capillary: 128 mg/dL — ABNORMAL HIGH (ref 70–99)
Glucose-Capillary: 128 mg/dL — ABNORMAL HIGH (ref 70–99)
Glucose-Capillary: 129 mg/dL — ABNORMAL HIGH (ref 70–99)
Glucose-Capillary: 133 mg/dL — ABNORMAL HIGH (ref 70–99)
Glucose-Capillary: 144 mg/dL — ABNORMAL HIGH (ref 70–99)
Glucose-Capillary: 147 mg/dL — ABNORMAL HIGH (ref 70–99)

## 2023-05-20 LAB — BASIC METABOLIC PANEL
Anion gap: 10 (ref 5–15)
BUN: 37 mg/dL — ABNORMAL HIGH (ref 8–23)
CO2: 26 mmol/L (ref 22–32)
Calcium: 8.9 mg/dL (ref 8.9–10.3)
Chloride: 107 mmol/L (ref 98–111)
Creatinine, Ser: 0.8 mg/dL (ref 0.61–1.24)
GFR, Estimated: >60 mL/min (ref >60)
Glucose, Bld: 113 mg/dL — ABNORMAL HIGH (ref 70–99)
Potassium: 4 mmol/L (ref 3.5–5.1)
Sodium: 143 mmol/L (ref 135–145)

## 2023-05-20 LAB — CULTURE, RESPIRATORY W GRAM STAIN

## 2023-05-20 MED ORDER — AMOXICILLIN-POT CLAVULANATE 875-125 MG PO TABS
1.0000 | ORAL_TABLET | Freq: Two times a day (BID) | ORAL | Status: DC
Start: 2023-05-20 — End: 2023-05-20

## 2023-05-20 MED ORDER — AMOXICILLIN-POT CLAVULANATE 250-62.5 MG/5ML PO SUSR
500.0000 mg | Freq: Three times a day (TID) | ORAL | Status: DC
  Administered 2023-05-20: 500 mg
  Filled 2023-05-20 (×3): qty 10

## 2023-05-20 MED ORDER — AMOXICILLIN-POT CLAVULANATE 400-57 MG/5ML PO SUSR
500.0000 mg | Freq: Three times a day (TID) | ORAL | Status: DC
Start: 2023-05-20 — End: 2023-05-22
  Administered 2023-05-20 – 2023-05-22 (×6): 500 mg
  Filled 2023-05-20 (×9): qty 6.3

## 2023-05-20 NOTE — Plan of Care (Signed)
  Problem: Education: Goal: Knowledge of General Education information will improve Description: Including pain rating scale, medication(s)/side effects and non-pharmacologic comfort measures Outcome: Not Progressing   Problem: Health Behavior/Discharge Planning: Goal: Ability to manage health-related needs will improve Outcome: Not Progressing   Problem: Clinical Measurements: Goal: Ability to maintain clinical measurements within normal limits will improve Outcome: Not Progressing Goal: Will remain free from infection Outcome: Not Progressing Goal: Diagnostic test results will improve Outcome: Not Progressing Goal: Respiratory complications will improve Outcome: Not Progressing Goal: Cardiovascular complication will be avoided Outcome: Not Progressing   Problem: Activity: Goal: Risk for activity intolerance will decrease Outcome: Not Progressing   Problem: Nutrition: Goal: Adequate nutrition will be maintained Outcome: Not Progressing   Problem: Coping: Goal: Level of anxiety will decrease Outcome: Not Progressing   Problem: Elimination: Goal: Will not experience complications related to bowel motility Outcome: Not Progressing Goal: Will not experience complications related to urinary retention Outcome: Not Progressing   Problem: Pain Managment: Goal: General experience of comfort will improve and/or be controlled Outcome: Not Progressing   Problem: Safety: Goal: Ability to remain free from injury will improve Outcome: Not Progressing   Problem: Skin Integrity: Goal: Risk for impaired skin integrity will decrease Outcome: Not Progressing   Problem: Education: Goal: Knowledge of disease or condition will improve Outcome: Not Progressing Goal: Knowledge of secondary prevention will improve (MUST DOCUMENT ALL) Outcome: Not Progressing Goal: Knowledge of patient specific risk factors will improve (DELETE if not current risk factor) Outcome: Not Progressing    Problem: Intracerebral Hemorrhage Tissue Perfusion: Goal: Complications of Intracerebral Hemorrhage will be minimized Outcome: Not Progressing   Problem: Coping: Goal: Will verbalize positive feelings about self Outcome: Not Progressing Goal: Will identify appropriate support needs Outcome: Not Progressing   Problem: Health Behavior/Discharge Planning: Goal: Ability to manage health-related needs will improve Outcome: Not Progressing Goal: Goals will be collaboratively established with patient/family Outcome: Not Progressing   Problem: Self-Care: Goal: Ability to participate in self-care as condition permits will improve Outcome: Not Progressing Goal: Verbalization of feelings and concerns over difficulty with self-care will improve Outcome: Not Progressing Goal: Ability to communicate needs accurately will improve Outcome: Not Progressing   Problem: Nutrition: Goal: Risk of aspiration will decrease Outcome: Not Progressing Goal: Dietary intake will improve Outcome: Not Progressing   Problem: Education: Goal: Ability to describe self-care measures that may prevent or decrease complications (Diabetes Survival Skills Education) will improve Outcome: Not Progressing Goal: Individualized Educational Video(s) Outcome: Not Progressing   Problem: Coping: Goal: Ability to adjust to condition or change in health will improve Outcome: Not Progressing   Problem: Fluid Volume: Goal: Ability to maintain a balanced intake and output will improve Outcome: Not Progressing   Problem: Health Behavior/Discharge Planning: Goal: Ability to identify and utilize available resources and services will improve Outcome: Not Progressing Goal: Ability to manage health-related needs will improve Outcome: Not Progressing   Problem: Metabolic: Goal: Ability to maintain appropriate glucose levels will improve Outcome: Not Progressing   Problem: Nutritional: Goal: Maintenance of adequate  nutrition will improve Outcome: Not Progressing Goal: Progress toward achieving an optimal weight will improve Outcome: Not Progressing   Problem: Skin Integrity: Goal: Risk for impaired skin integrity will decrease Outcome: Not Progressing

## 2023-05-20 NOTE — Progress Notes (Addendum)
 Speech Language Pathology Treatment: Dysphagia  Patient Details Name: Derek Yates MRN: [id number] DOB: [birth year] Today's Date: 05/20/2023 Time: 4098-1191 SLP Time Calculation (min) (ACUTE ONLY): 11 min  Assessment / Plan / Recommendation Clinical Impression  Pt presents with seemingly further decreased alertness this date, requiring Max multimodal cueing to accept bolus presentations. RN performing oral care upon SLP arrival with pt's wife and MD in room. Pt was able to mobilize and expectorate a significant amount of thick secretions given one tspn of water, but this resulted in a prolonged bout of coughing that resembled a huff. Pt became increasingly fatigued following effort required to cough this extensively and no further POs were attempted. Recommend nursing staff continue to provide thorough oral care QID preceding tspns of water, the purpose of which is to provide comfort, promote pharyngeal hygiene, and preserve swallow function. In minimal quantities, this is not expected to be harmful to the pt even if some moisture is aspirated. Cued coughs after trials also will help airway protection. Encourage RN to continue these. Posted a sign for guidance. Discussed with MD and pt's wife that SLP is following for increased alertness to initiate plans for an instrumental swallow study as pt is able to participate.    HPI HPI: Patient is an 83 y.o. male with PMH: dementia, recurrent UTI's, CVA, HTN. He presented to the hospital on 05/11/23 as a code stroke with speech deficits and left sided weakness. 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 Patient has been NPO; Cortrak placed 2/21. He failed [facility] swallow screen with RN.      SLP Plan  Continue with current plan of care      Recommendations for follow up therapy are one component of a multi-disciplinary discharge planning  process, led by the attending physician.  Recommendations may be updated based on patient status, additional functional criteria and insurance authorization.    Recommendations  Diet recommendations: NPO Medication Administration: Via alternative means                  Oral care QID;Oral care prior to ice chip/H20   Frequent or constant Supervision/Assistance Dysphagia, unspecified (R13.10)     Continue with current plan of care     Gwynneth Aliment, M.A., CF-SLP Speech Language Pathology, Acute Rehabilitation Services  Secure Chat preferred [phone]   05/20/2023, 11:08 AM

## 2023-05-20 NOTE — Plan of Care (Signed)

## 2023-05-20 NOTE — Progress Notes (Signed)
 [facility] PROGRESS NOTE    Progress Note  Derek Yates  ZOX:[id number] DOB: 27-Jun-1940 DOA: 05/11/2023 PCP: [name], MD     Brief Narrative:   Derek Yates is an 83 y.o. male past medical history significant for essential hypertension, CVA recurrent UTI and dementia presented to [facility] with code stroke CT of the head showed intraparenchymal hemorrhage to the right caudate extending with intraventricular lesions, CCM consulted on 05/15/2023 for fevers for 48 hours, thought to be related to aspiration pneumonia transferred to CCM on 05/17/2023. 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 Repeated CT scan on 05/13/2023 showed 6 mm basal ganglia hemorrhage which showed increase in size from previous. Further management per neurology. Physical therapy evaluated the patient and recommended inpatient rehab Is not a candidate for antithrombotic therapy due to ICH CAA Still with tube feedings neurology to dictate if PEG tube placement needed. Speech evaluated the patient, will keep the patient NPO.  Aspiration pneumonia: CT chest on 05/17/2023 showed lower lobes multifocal mucous plugging and patchy airspace infiltrate concerning for aspiration pneumonia Tmax of 100.1. Transition antibiotics to oral Augmentin will complete a 10-day course. Leukocytosis resolved. Palliative care was consulted.  Acute respiratory failure with hypoxia due to aspiration pneumonia: Now been weaned to room air. Continue chest physiotherapy and inhalers.  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 Continue statins.  Acute kidney injury: Likely hemodynamic mediated resolved with IV fluid resuscitation.  Essential hypertension: Continue  nifedipine.  Hyperlipidemia: Continue statins.  Dysphagia: Currently n.p.o. with a core track.  Advance dementia: Severe memory loss  Protein-calorie malnutrition, severe noted   DVT prophylaxis: SCD Family Communication:none Status is: Inpatient Remains inpatient appropriate because: Acute intracranial hemorrhage    Code Status:     Code Status Orders  (From admission, onward)           Start     Ordered   05/17/23 1008  Do not attempt resuscitation (DNR)- Limited -Do Not Intubate (DNI)  (Code Status)  Continuous       Question Answer Comment  If pulseless and not breathing No CPR or chest compressions.   In Pre-Arrest Conditions (Patient Is Breathing and Has A Pulse) Do not intubate. Provide all appropriate non-invasive medical interventions. Avoid ICU transfer unless indicated or required.   Consent: Discussion documented in EHR or advanced directives reviewed      05/17/23 1008           Code Status History     Date Active Date Inactive Code Status Order ID Comments User Context   05/11/2023 2022 05/17/2023 1008 Full Code [id number]  [name], MD Inpatient         IV Access:   Peripheral IV   Procedures and diagnostic studies:   No results found.    Medical Consultants:   None.   Subjective:    Derek Yates sleepy this morning.  Objective:    Vitals:   05/19/23 2250 05/20/23 0000 05/20/23 0300 05/20/23 0338  BP: (!) 108/51  112/68   Pulse: 96  92   Resp: (!) 33  (!) 31   Temp: 100.1 F (37.8 C) 98.4 F (36.9 C) 99 F (37.2 C)   TempSrc: Axillary Axillary Axillary   SpO2: 91%  94%   Weight:    63.5 kg  SpO2: 94 % O2 Flow Rate (L/min): 2 L/min   Intake/Output Summary (Last 24 hours) at 05/20/2023 0714 Last data filed at 05/20/2023 0345 Gross per 24 hour  Intake 872 ml  Output 1000 ml  Net -128 ml   Filed Weights   05/18/23 0500 05/19/23 0500 05/20/23 0338  Weight: 61.8 kg 63 kg 63.5 kg     Exam: General exam: In no acute distress. Respiratory system: Good air movement and clear to auscultation. Cardiovascular system: S1 & S2 heard, RRR. No JVD. Gastrointestinal system: Abdomen is nondistended, soft and nontender.  Extremities: No pedal edema. Skin: No rashes, lesions or ulcers  Data Reviewed:    Labs: Basic Metabolic Panel: Recent Labs  Lab 05/13/23 1358 05/13/23 1644 05/14/23 0412 05/14/23 1712 05/15/23 0502 05/16/23 0528 05/17/23 0455 05/18/23 0834 05/19/23 0501 05/20/23 0537  NA  --   --  141  --    < > 141 142 143 141 143  K  --   --  4.1  --    < > 3.9 4.1 4.6 4.6 4.0  CL  --   --  102  --    < > 102 108 105 105 107  CO2  --   --  26  --    < > 28 29 30 25 26   GLUCOSE  --   --  160*  --    < > 169* 148* 135* 131* 113*  BUN  --   --  30*  --    < > 30* 36* 34* 39* 37*  CREATININE  --   --  0.97  --    < > 0.93 0.80 0.79 0.82 0.80  CALCIUM  --   --  9.7  --    < > 9.4 9.2 9.5 8.9 8.9  MG 2.4 2.3 2.4 2.4  --   --   --   --   --   --   PHOS 3.3 2.9 2.2* 2.5  --  3.1  --   --   --   --    < > = values in this interval not displayed.   GFR Estimated Creatinine Clearance: 60.6 mL/min (by C-G formula based on SCr of 0.8 mg/dL). Liver Function Tests: No results for input(s): "AST", "ALT", "ALKPHOS", "BILITOT", "PROT", "ALBUMIN" in the last 168 hours.  No results for input(s): "LIPASE", "AMYLASE" in the last 168 hours. No results for input(s): "AMMONIA" in the last 168 hours. Coagulation profile No results for input(s): "INR", "PROTIME" in the last 168 hours.  COVID-19 Labs  No results for input(s): "DDIMER", "FERRITIN", "LDH", "CRP" in the last 72 hours.  Lab Results  Component Value Date   SARSCOV2NAA Not Detected 09/14/2018    CBC: Recent Labs  Lab 05/16/23 0528 05/17/23 0455 05/18/23 0834 05/19/23 0501 05/20/23 0537  WBC 8.3 8.7 12.4* 8.8 7.8  HGB 11.6* 11.7* 11.6* 10.2* 10.8*  HCT 34.1* 33.9* 34.4* 29.7* 31.4*  MCV 82.8 82.1 83.1  83.0 82.4  PLT 258 273 287 239 319   Cardiac Enzymes: No results for input(s): "CKTOTAL", "CKMB", "CKMBINDEX", "TROPONINI" in the last 168 hours. BNP (last 3 results) No results for input(s): "PROBNP" in the last 8760 hours. CBG: Recent Labs  Lab 05/19/23 1136 05/19/23 1607 05/19/23 2001 05/19/23 2301 05/20/23 0332  GLUCAP 122* 121* 124* 123* 133*   D-Dimer: No results for input(s): "DDIMER" in the last 72 hours. Hgb A1c: No results for input(s): "HGBA1C" in the last 72 hours.  Lipid Profile: No results for input(s): "CHOL", "HDL", "LDLCALC", "TRIG", "CHOLHDL", "LDLDIRECT" in the last 72 hours. Thyroid function studies: No results for input(s): "TSH", "T4TOTAL", "T3FREE", "THYROIDAB" in the last 72 hours.  Invalid input(s): "FREET3" Anemia work up: No results for input(s): "VITAMINB12", "FOLATE", "FERRITIN", "TIBC", "IRON", "RETICCTPCT" in the last 72 hours. Sepsis Labs: Recent Labs  Lab 05/17/23 0455 05/18/23 0834 05/19/23 0501 05/20/23 0537  WBC 8.7 12.4* 8.8 7.8   Microbiology Recent Results (from the past 240 hours)  MRSA Next Gen by PCR, Nasal     Status: None   Collection Time: 05/11/23  8:12 PM   Specimen: Nasal Mucosa; Nasal Swab  Result Value Ref Range Status   MRSA by PCR Next Gen NOT DETECTED NOT DETECTED Final    Comment: (NOTE) The GeneXpert MRSA Assay (FDA approved for NASAL specimens only), is one component of a comprehensive MRSA colonization surveillance program. It is not intended to diagnose MRSA infection nor to guide or monitor treatment for MRSA infections. Test performance is not FDA approved in patients less than 54 years old. Performed at [facility], [address] [place], [place] [address]   Culture, blood (Routine X 2) w Reflex to ID Panel     Status: None   Collection Time: 05/14/23  5:12 PM   Specimen: BLOOD  Result Value Ref Range Status   Specimen Description BLOOD SITE NOT SPECIFIED  Final   Special Requests   Final     BOTTLES DRAWN AEROBIC AND ANAEROBIC Blood Culture adequate volume   Culture   Final    NO GROWTH 5 DAYS Performed at [facility], [address], [place], [place] [address]    Report Status 05/19/2023 FINAL  Final  Culture, blood (Routine X 2) w Reflex to ID Panel     Status: None   Collection Time: 05/14/23  5:20 PM   Specimen: BLOOD  Result Value Ref Range Status   Specimen Description BLOOD SITE NOT SPECIFIED  Final   Special Requests   Final    BOTTLES DRAWN AEROBIC AND ANAEROBIC Blood Culture adequate volume   Culture   Final    NO GROWTH 5 DAYS Performed at [facility], [address] [place], [place] [address]    Report Status 05/19/2023 FINAL  Final  Respiratory (~20 pathogens) panel by PCR     Status: None   Collection Time: 05/15/23 12:45 PM  Result Value Ref Range Status   Adenovirus NOT DETECTED NOT DETECTED Final   Coronavirus 229E NOT DETECTED NOT DETECTED Final    Comment: (NOTE) The Coronavirus on the Respiratory Panel, DOES NOT test for the novel  Coronavirus (2019 nCoV)    Coronavirus HKU1 NOT DETECTED NOT DETECTED Final   Coronavirus NL63 NOT DETECTED NOT DETECTED Final   Coronavirus OC43 NOT DETECTED NOT DETECTED Final   Metapneumovirus NOT DETECTED NOT DETECTED Final   Rhinovirus / Enterovirus NOT DETECTED NOT DETECTED Final   Influenza A NOT DETECTED NOT DETECTED Final   Influenza B NOT DETECTED NOT DETECTED Final   Parainfluenza Virus 1 NOT DETECTED NOT DETECTED Final   Parainfluenza Virus 2 NOT DETECTED NOT DETECTED Final   Parainfluenza Virus 3 NOT DETECTED NOT DETECTED Final   Parainfluenza Virus 4 NOT DETECTED NOT DETECTED Final   Respiratory Syncytial Virus NOT DETECTED NOT DETECTED Final   Bordetella pertussis NOT DETECTED NOT DETECTED Final   Bordetella Parapertussis NOT DETECTED NOT DETECTED Final   Chlamydophila pneumoniae NOT  DETECTED NOT DETECTED Final   Mycoplasma pneumoniae NOT DETECTED NOT DETECTED Final    Comment:  Performed at [facility], [address], [place], [place] [address]  Expectorated Sputum Assessment w Gram Stain, Rflx to Resp Cult     Status: None   Collection Time: 05/17/23 10:42 AM   Specimen: Sputum  Result Value Ref Range Status   Specimen Description SPUTUM  Final   Special Requests NONE  Final   Sputum evaluation   Final    THIS SPECIMEN IS ACCEPTABLE FOR SPUTUM CULTURE Performed at [facility], [address], [place], [place] [address]    Report Status 05/17/2023 FINAL  Final  Culture, Respiratory w Gram Stain     Status: None (Preliminary result)   Collection Time: 05/17/23 10:42 AM   Specimen: SPU  Result Value Ref Range Status   Specimen Description SPUTUM  Final   Special Requests NONE Reflexed from [id number]  Final   Gram Stain   Final    MODERATE WBC PRESENT, PREDOMINANTLY PMN MODERATE GRAM NEGATIVE RODS MODERATE GRAM POSITIVE COCCI    Culture   Final    MODERATE STAPHYLOCOCCUS EPIDERMIDIS SUSCEPTIBILITIES TO FOLLOW Performed at [facility], [address], [place], [place] [address]    Report Status PENDING  Incomplete  MRSA Next Gen by PCR, Nasal     Status: None   Collection Time: 05/17/23 10:51 AM   Specimen: Nasal Mucosa; Nasal Swab  Result Value Ref Range Status   MRSA by PCR Next Gen NOT DETECTED NOT DETECTED Final    Comment: (NOTE) The GeneXpert MRSA Assay (FDA approved for NASAL specimens only), is one component of a comprehensive MRSA colonization surveillance program. It is not intended to diagnose MRSA infection nor to guide or monitor treatment for MRSA infections. Test performance is not FDA approved in patients less than 69 years old. Performed at [facility], [address], [place], [place] [address]   Gastrointestinal Panel by PCR , Stool     Status: None   Collection Time: 05/17/23 11:24 AM   Specimen: Stool  Result Value Ref Range Status   Campylobacter species NOT DETECTED NOT DETECTED Final   Plesimonas  shigelloides NOT DETECTED NOT DETECTED Final   Salmonella species NOT DETECTED NOT DETECTED Final   Yersinia enterocolitica NOT DETECTED NOT DETECTED Final   Vibrio species NOT DETECTED NOT DETECTED Final   Vibrio cholerae NOT DETECTED NOT DETECTED Final   Enteroaggregative E coli (EAEC) NOT DETECTED NOT DETECTED Final   Enteropathogenic E coli (EPEC) NOT DETECTED NOT DETECTED Final   Enterotoxigenic E coli (ETEC) NOT DETECTED NOT DETECTED Final   Shiga like toxin producing E coli (STEC) NOT DETECTED NOT DETECTED Final   Shigella/Enteroinvasive E coli (EIEC) NOT DETECTED NOT DETECTED Final   Cryptosporidium NOT DETECTED NOT DETECTED Final   Cyclospora cayetanensis NOT DETECTED NOT DETECTED Final   Entamoeba histolytica NOT DETECTED NOT DETECTED Final   Giardia lamblia NOT DETECTED NOT DETECTED Final   Adenovirus F40/41 NOT DETECTED NOT DETECTED Final   Astrovirus NOT DETECTED NOT DETECTED Final   Norovirus GI/GII NOT DETECTED NOT DETECTED Final   Rotavirus A NOT DETECTED NOT DETECTED Final   Sapovirus (I, II, IV, and V) NOT DETECTED NOT DETECTED Final    Comment: Performed at [facility], [address] [place], [place] [address]  Culture, blood (Routine X 2) w Reflex to ID Panel     Status: None (Preliminary  result)   Collection Time: 05/17/23  4:04 PM   Specimen: BLOOD  Result Value Ref Range Status   Specimen Description BLOOD SITE NOT SPECIFIED  Final   Special Requests   Final    BOTTLES DRAWN AEROBIC AND ANAEROBIC Blood Culture results may not be optimal due to an inadequate volume of blood received in culture bottles   Culture   Final    NO GROWTH 3 DAYS Performed at [facility], [address] [place], [place] [address]    Report Status PENDING  Incomplete  Culture, blood (Routine X 2) w Reflex to ID Panel     Status: None (Preliminary result)   Collection Time: 05/17/23  4:04 PM   Specimen: BLOOD  Result Value Ref Range Status   Specimen Description  BLOOD SITE NOT SPECIFIED  Final   Special Requests   Final    BOTTLES DRAWN AEROBIC AND ANAEROBIC Blood Culture results may not be optimal due to an inadequate volume of blood received in culture bottles   Culture   Final    NO GROWTH 3 DAYS Performed at [facility], [address] [place], [place] [address]    Report Status PENDING  Incomplete  Culture, OB Urine     Status: None   Collection Time: 05/17/23  4:14 PM   Specimen: Urine, Random  Result Value Ref Range Status   Specimen Description URINE, RANDOM  Final   Special Requests NONE  Final   Culture   Final    NO GROWTH NO GROUP B STREP (S.AGALACTIAE) ISOLATED Performed at [facility], [address] [place], [place] [address]    Report Status 05/18/2023 FINAL  Final     Medications:    amLODipine  10 mg Per Tube Daily   Chlorhexidine Gluconate Cloth  6 each Topical Daily   feeding supplement (PROSource TF20)  60 mL Per Tube BID   fiber supplement (BANATROL TF)  60 mL Per Tube BID   guaiFENesin  15 mL Per Tube Q6H   heparin injection (subcutaneous)  5,000 Units Subcutaneous Q12H   insulin aspart  0-6 Units Subcutaneous Q4H   multivitamin with minerals  1 tablet Per Tube Daily   mouth rinse  15 mL Mouth Rinse 4 times per day   pantoprazole (PROTONIX) IV  40 mg Intravenous QHS   Continuous Infusions:  ceFEPime (MAXIPIME) IV 2 g (05/19/23 2208)   clevidipine     feeding supplement (OSMOLITE 1.5 CAL) 1,000 mL (05/20/23 0509)   metronidazole 500 mg (05/20/23 0109)      LOS: 9 days   Marinda Elk  [facility]  05/20/2023, 7:14 AM

## 2023-05-20 NOTE — Progress Notes (Addendum)
 STROKE TEAM PROGRESS NOTE   INTERIM HISTORY/SUBJECTIVE  Neurological exam stable.  Today, WBCs have improved, afebrile. Wife at bedside.  Plan of care and assessment discussed.  OBJECTIVE  CBC    Component Value Date/Time   WBC 7.8 05/20/2023 0537   RBC 3.81 (L) 05/20/2023 0537   HGB 10.8 (L) 05/20/2023 0537   HGB 13.3 02/03/2023 0852   HCT 31.4 (L) 05/20/2023 0537   HCT 39.9 02/03/2023 0852   PLT 319 05/20/2023 0537   PLT 271 02/03/2023 0852   MCV 82.4 05/20/2023 0537   MCV 87 02/03/2023 0852   MCH 28.3 05/20/2023 0537   MCHC 34.4 05/20/2023 0537   RDW 15.2 05/20/2023 0537   RDW 13.2 02/03/2023 0852   LYMPHSABS 2.8 05/11/2023 1344   LYMPHSABS 2.6 02/03/2023 0852   MONOABS 0.8 05/11/2023 1344   EOSABS 0.1 05/11/2023 1344   EOSABS 0.3 02/03/2023 0852   BASOSABS 0.0 05/11/2023 1344   BASOSABS 0.1 02/03/2023 0852    BMET    Component Value Date/Time   NA 143 05/20/2023 0537   NA 143 02/03/2023 0852   K 4.0 05/20/2023 0537   CL 107 05/20/2023 0537   CO2 26 05/20/2023 0537   GLUCOSE 113 (H) 05/20/2023 0537   BUN 37 (H) 05/20/2023 0537   BUN 15 02/03/2023 0852   CREATININE 0.80 05/20/2023 0537   CALCIUM 8.9 05/20/2023 0537   EGFR 76 02/03/2023 0852   GFRNONAA >60 05/20/2023 0537    IMAGING past 24 hours No results found.    Vitals:   05/19/23 2250 05/20/23 0000 05/20/23 0300 05/20/23 0338  BP: (!) 108/51  112/68   Pulse: 96  92   Resp: (!) 33  (!) 31   Temp: 100.1 F (37.8 C) 98.4 F (36.9 C) 99 F (37.2 C)   TempSrc: Axillary Axillary Axillary   SpO2: 91%  94%   Weight:    63.5 kg     PHYSICAL EXAM General: Lethargic critically ill elderly male Psych: Unable to assess CV: Regular rate and rhythm on monitor Respiratory:  Regular, unlabored respirations on room air. Mild rhonchi on right side.  NEURO:  Mental Status: more drowsy today but will arouse. Oriented to name, place.  States age is 26 poor attention.  Follows commands. Sticks tongue  out. Showed me 2 fingers. Speech/Language: Hypophonic.  No dysarthria or aphasia.  Does have some intermittent perseveration  Cranial Nerves:  II: PERRL.  No blink to threat on left III, IV, VI: EOMI. Eyelids elevate symmetrically.  V: Sensation is intact to light touch and symmetrical to face.  VII: Left facial droop VIII: hearing intact to voice. IX, X: Palate elevates symmetrically. Phonation is normal.  YQ:MVHQIONG shrug 5/5. XII: tongue is midline without fasciculations. Quiet voice.  Motor: Overall generalized weakness Left arm weakness 3/5, bilateral lower extremities not antigravity, can wiggle toes, withdraws on both lower extremities right greater than left Tone: is normal and bulk is normal Sensation- Intact to light touch bilaterally. Extinction absent to light touch to DSS.   Coordination: FTN intact bilaterally, HKS: no ataxia in BLE.No drift.  Gait- deferred     ASSESSMENT/PLAN  [name] is a 83 y.o. male with history of hypertension, CVA, recurrent UTIs, dementia who presented to [facility] as a code stroke due to speech deficits, left-sided deficits.  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 Intraventricular penetration with the amount of intraventricular blood being similar. CTA not pursued as it will not change mangement MRI ICH centered at the right caudate head with IVH and mild communicating hydrocephalus. 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 Old left frontal, left occipital and right  parieto-occipital infarcts. CT repeat  2/21: Roughly 6 mL Right basal ganglia hemorrhage has increased from approximately 4 mL at presentation. Stable moderate volume of IVH. No acute ventriculomegaly.  Mild leftward shift of 3 mm stable CT head repeat 2/22 stable 2D Echo: LVEF 65 to 70%  LDL 69 HgbA1c 5.7 VTE prophylaxis - SCDs No antithrombotic prior to admission, now on No antithrombotic due to ICH and CAA Therapy recommendations: SNF Disposition pending  Advanced CAA MRI showed advanced CAA No antithrombotics Strict BP control less than 140 Recommend no statin at this time  Hypertension Home meds:  nifedipine 30mg  Stable BP goal less than 140 given CAA Continue amlodipine 10 Strict BP control at home  Hyperlipidemia Home meds:  lipitor 10mg  LDL 66, goal < 70 Recommend no statin given advanced CAA  Aspirin pneumonia Fever Tmax 101.6-> afebrile Leukocytosis WBC 8.7--12.4--8.8--7.8 Chest CT 2/25 consistent with aspiration pneumonia On chest PT off cefepime and Flagyl  Dysphagia Patient has post-stroke dysphagia SLP consulted Now n.p.o. Core track placed On tube feeds  Dementia Severe memory loss at baseline Able to walk with assistance at home Home meds including Aricept  Other Stroke Risk Factors Advanced Age  Other Active Problems Recurrent UTIs PTA AKI, Cr 1.40-1.24>0.93--0.82--0.80  Hospital day # 9   Pt seen by Neuro NP/APP and later by MD. Note/plan to be edited by MD as needed.    Lynnae January, DNP, AGACNP-BC [facility] Please use AMION for contact information & EPIC for messaging.  ATTENDING NOTE: I reviewed above note and agree with the assessment and plan. Pt was seen and examined.   Wife at the bedside. Pt lethargic but eyes open, more awake alert and less lethargic than yesterday. Orientated to place and wife but not to age or time. Able to answer simple questions with brief words, severe dysarthria with intermittent  coughing. No gaze palsy, eyes midline, tracking bilaterally with voice, blinking to visual threat bilaterally, PERRL. Left facial droop. Tongue protrusion not cooperative. LUE drift but not touching bed, RUE no drift, BLE withdraw to pain seems LLE less than RLE. Sensation, coordination not cooperative and gait not tested.   Pt showing sign of improvement on neuro status and pneumonia control. Afebrile and normal WBC, Cre improving. BP under control. Continue current management, no antiplatelet, no statin. Strict BP management. PT and OT recommend SNF.  For detailed assessment and plan, please refer to above/below as I have made changes wherever appropriate.   Neurology will sign off. Please call with questions. Pt will follow up with stroke clinic NP at [facility] in about 4 weeks after discharge. Thanks for the consult.   Marvel Plan, MD PhD Stroke Neurology 05/20/2023 1:52 PM

## 2023-05-20 NOTE — Progress Notes (Signed)
 Physical Therapy Treatment Patient Details Name: Derek Yates MRN: [id number] DOB: Jul 26, 1940 Today's Date: 05/20/2023   History of Present Illness 83 yo male presents to [facility] on 2/19 as code stroke for speech deficits, L weakness. 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    PT Comments  Patient with limited progress due to lethargy and cognition.  Patient tight on L side and leaning that way, was able to position therapeutically in chair and noted improved SpO2 from in supine.  Patient benefiting from skilled PT for positioning, mobility and to maximize cardiopulmonary endurance.  PT will continue to follow.    If plan is discharge home, recommend the following: Two people to help with walking and/or transfers;Two people to help with bathing/dressing/bathroom   Can travel by private vehicle     No  Equipment Recommendations  Wheelchair (measurements PT);Wheelchair cushion (measurements PT);Hospital bed;Hoyer lift    Recommendations for Other Services       Precautions / Restrictions Precautions Precautions: Fall Recall of Precautions/Restrictions: Impaired Precaution/Restrictions Comments: Rectal tube, condom cath, NG Feeding tube     Mobility  Bed Mobility Overal bed mobility: Needs Assistance Bed Mobility: Supine to Sit     Supine to sit: Total assist, HOB elevated, +2 for physical assistance     General bed mobility comments: assist to lift trunk and scoot hips, HOB up    Transfers Overall transfer level: Needs assistance Equipment used: Ambulation equipment used Transfers: Sit to/from Stand, Bed to chair/wheelchair/BSC Sit to Stand: Max assist, +2 physical assistance           General transfer comment: assist for L hand on Stedy x 2 and heavy lifting of hips to place Stedy flaps under; better for up to remove pads when getting to chair; flexed at hips and kyphotic with L lateral  lean Transfer via Lift Equipment: Stedy  Ambulation/Gait               General Gait Details: unable at this time   Comptroller Bed    Modified Rankin (Stroke Patients Only) Modified Rankin (Stroke Patients Only) Pre-Morbid Rankin Score: Slight disability Modified Rankin: Severe disability     Balance Overall balance assessment: Needs assistance   Sitting balance-Leahy Scale: Poor Sitting balance - Comments: leans L; working initially for leaning on R elbow for L lateral trunk stretch with pt able to push up to sit with R arm and maintained midline about 1 to 1.5 minutes prior to leaning back to L   Standing balance support: Bilateral upper extremity supported Standing balance-Leahy Scale: Zero Standing balance comment: up to partial standing only with Stedy lift and flexed posture and only briefly                            Communication Communication Communication: Impaired Factors Affecting Communication: Difficulty expressing self;Reduced clarity of speech  Cognition Arousal: Obtunded Behavior During Therapy: Flat affect   PT - Cognitive impairments: Attention, Initiation                       PT - Cognition Comments: patient opening eyes briefly, followed commands about 20% and able to attempt to verbalize twice, though not intelligible. Following commands: Impaired Following commands impaired: Follows one step commands inconsistently,  Follows one step commands with increased time    Cueing    Exercises General Exercises - Lower Extremity Long Arc Quad: AROM, Both, 5 reps, Seated Other Exercises Other Exercises: extra time for L lateral cervical stretch and soft tissue mobs while in chair    General Comments General comments (skin integrity, edema, etc.): SpO2 in supine 88%, coughing intermittently but non-productive but SpO2 94% in sitting on 2L O2      Pertinent Vitals/Pain Pain  Assessment Pain Assessment: Faces Faces Pain Scale: Hurts little more Pain Location: indicated by pushing to avoid leaning R Pain Descriptors / Indicators: Grimacing, Discomfort Pain Intervention(s): Monitored during session, Repositioned, Utilized relaxation techniques    Home Living                          Prior Function            PT Goals (current goals can now be found in the care plan section) Progress towards PT goals: Not progressing toward goals - comment    Frequency    Min 1X/week      PT Plan      Co-evaluation              AM-PAC PT "6 Clicks" Mobility   Outcome Measure  Help needed turning from your back to your side while in a flat bed without using bedrails?: Total Help needed moving from lying on your back to sitting on the side of a flat bed without using bedrails?: Total Help needed moving to and from a bed to a chair (including a wheelchair)?: Total Help needed standing up from a chair using your arms (e.g., wheelchair or bedside chair)?: Total Help needed to walk in hospital room?: Total Help needed climbing 3-5 steps with a railing? : Total 6 Click Score: 6    End of Session Equipment Utilized During Treatment: Oxygen Activity Tolerance: Patient limited by lethargy Patient left: in chair;with call bell/phone within reach;Other (comment);with chair alarm set (positioned with wedge) Nurse Communication: Mobility status;Need for lift equipment PT Visit Diagnosis: Other abnormalities of gait and mobility (R26.89);Muscle weakness (generalized) (M62.81)     Time: 1027-2536 PT Time Calculation (min) (ACUTE ONLY): 30 min  Charges:    $Therapeutic Activity: 23-37 mins PT General Charges $$ ACUTE PT VISIT: 1 Visit                     Derek Yates, PT Acute Rehabilitation Services Office:[phone] 05/20/2023    Derek Yates 05/20/2023, 6:45 PM

## 2023-05-21 DIAGNOSIS — Z7189 Other specified counseling: Secondary | ICD-10-CM | POA: Diagnosis not present

## 2023-05-21 DIAGNOSIS — I610 Nontraumatic intracerebral hemorrhage in hemisphere, subcortical: Secondary | ICD-10-CM | POA: Diagnosis not present

## 2023-05-21 DIAGNOSIS — Z515 Encounter for palliative care: Secondary | ICD-10-CM

## 2023-05-21 DIAGNOSIS — I6389 Other cerebral infarction: Secondary | ICD-10-CM | POA: Diagnosis not present

## 2023-05-21 LAB — GLUCOSE, CAPILLARY
Glucose-Capillary: 123 mg/dL — ABNORMAL HIGH (ref 70–99)
Glucose-Capillary: 147 mg/dL — ABNORMAL HIGH (ref 70–99)
Glucose-Capillary: 155 mg/dL — ABNORMAL HIGH (ref 70–99)
Glucose-Capillary: 147 mg/dL — ABNORMAL HIGH (ref 70–99)
Glucose-Capillary: 123 mg/dL — ABNORMAL HIGH (ref 70–99)

## 2023-05-21 NOTE — Plan of Care (Signed)
   Problem: Coping: Goal: Level of anxiety will decrease Outcome: Progressing

## 2023-05-21 NOTE — Progress Notes (Signed)
 [facility] PROGRESS NOTE    Progress Note  Derek Yates  [name]:[id number] DOB: 09/28/1940 DOA: 05/11/2023 PCP: [name], MD     Brief Narrative:   Derek Yates is an 83 y.o. male past medical history significant for essential hypertension, CVA recurrent UTI and dementia presented to [facility] with code stroke CT of the head showed intraparenchymal hemorrhage to the right caudate extending with intraventricular lesions, CCM consulted on 05/15/2023 for fevers for 48 hours, thought to be related to aspiration pneumonia transferred to CCM on 05/17/2023. 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 Repeated CT scan on 05/13/2023 showed 6 mm basal ganglia hemorrhage which showed increase in size from previous. Further management per neurology. 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  Aspiration pneumonia: CT chest on 05/17/2023 showed lower lobes multifocal mucous plugging and patchy airspace infiltrate concerning for aspiration pneumonia Tmax of 100.9. Continue oral Augmentin for total of 10-day course. Leukocytosis resolved. Palliative care was consulted.  Acute respiratory failure with hypoxia due to aspiration pneumonia: Now been weaned to room air. Continue chest physiotherapy and inhalers.  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 Continue statins.  Acute kidney injury: Likely hemodynamic mediated resolved with IV fluid resuscitation.  Essential hypertension: Continue  nifedipine.  Hyperlipidemia: Continue statins.  Dysphagia: Currently n.p.o. with a core track.  Advance dementia: Severe memory loss  Protein-calorie malnutrition, severe noted   DVT prophylaxis: SCD Family Communication:none Status is: Inpatient Remains inpatient appropriate because: Acute intracranial hemorrhage, okay to transfer to the floor    Code Status:     Code Status Orders  (From admission, onward)           Start     Ordered   05/17/23 1008  Do not attempt resuscitation (DNR)- Limited -Do Not Intubate (DNI)  (Code Status)  Continuous       Question Answer Comment  If pulseless and not breathing No CPR or chest compressions.   In Pre-Arrest Conditions (Patient Is Breathing and Has A Pulse) Do not intubate. Provide all appropriate non-invasive medical interventions. Avoid ICU transfer unless indicated or required.   Consent: Discussion documented in EHR or advanced directives reviewed      05/17/23 1008           Code Status History     Date Active Date Inactive Code Status Order ID Comments User Context   05/11/2023 2022 05/17/2023 1008 Full Code [id number]  [name], MD Inpatient         IV Access:   Peripheral IV   Procedures and diagnostic studies:   No results found.    Medical Consultants:   None.   Subjective:    Derek Yates more this morning was still sleeping  Objective:    Vitals:   05/20/23 1942 05/20/23 2300 05/21/23 0410 05/21/23 0700  BP: 134/73  129/70 130/72  Pulse:    95  Resp:    (!) 22  Temp: 98.6 F (37 C) 99.7 F (37.6 C) 100.2 F (37.9 C) 99.1 F (37.3 C)  TempSrc: Axillary Axillary Axillary Axillary  SpO2:  95%  Weight:       SpO2: 95 % O2 Flow Rate (L/min): 2 L/min   Intake/Output Summary (Last 24 hours) at 05/21/2023 0836 Last data filed at 05/21/2023 0400 Gross per 24 hour  Intake 934 ml  Output 1270 ml  Net -336 ml   Filed Weights   05/18/23 0500 05/19/23 0500  05/20/23 0338  Weight: 61.8 kg 63 kg 63.5 kg    Exam: General exam: In no acute distress. Respiratory system: Good air movement and clear to auscultation. Cardiovascular system: S1 & S2 heard, RRR. No JVD. Gastrointestinal system: Abdomen is nondistended, soft and nontender.  Extremities: No pedal edema. Skin: No rashes, lesions or ulcers  Data Reviewed:    Labs: Basic Metabolic Panel: Recent Labs  Lab 05/14/23 1712 05/15/23 0502 05/16/23 1610 05/17/23 0455 05/18/23 0834 05/19/23 0501 05/20/23 0537  NA  --    < > 141 142 143 141 143  K  --    < > 3.9 4.1 4.6 4.6 4.0  CL  --    < > 102 108 105 105 107  CO2  --    < > 28 29 30 25 26   GLUCOSE  --    < > 169* 148* 135* 131* 113*  BUN  --    < > 30* 36* 34* 39* 37*  CREATININE  --    < > 0.93 0.80 0.79 0.82 0.80  CALCIUM  --    < > 9.4 9.2 9.5 8.9 8.9  MG 2.4  --   --   --   --   --   --   PHOS 2.5  --  3.1  --   --   --   --    < > = values in this interval not displayed.   GFR Estimated Creatinine Clearance: 60.6 mL/min (by C-G formula based on SCr of 0.8 mg/dL). Liver Function Tests: No results for input(s): "AST", "ALT", "ALKPHOS", "BILITOT", "PROT", "ALBUMIN" in the last 168 hours.  No results for input(s): "LIPASE", "AMYLASE" in the last 168 hours. No results for input(s): "AMMONIA" in the last 168 hours. Coagulation profile No results for input(s): "INR", "PROTIME" in the last 168 hours.  COVID-19 Labs  No results for input(s): "DDIMER", "FERRITIN", "LDH", "CRP" in the last 72 hours.  Lab Results  Component Value Date   SARSCOV2NAA Not Detected 09/14/2018    CBC: Recent Labs  Lab 05/16/23 0528 05/17/23 0455 05/18/23 0834 05/19/23 0501 05/20/23 0537  WBC 8.3 8.7 12.4* 8.8 7.8  HGB 11.6* 11.7* 11.6* 10.2* 10.8*  HCT 34.1* 33.9* 34.4* 29.7* 31.4*  MCV 82.8 82.1 83.1 83.0 82.4  PLT 258 273 287 239 319   Cardiac Enzymes: No results for input(s): "CKTOTAL", "CKMB", "CKMBINDEX", "TROPONINI" in the  last 168 hours. BNP (last 3 results) No results for input(s): "PROBNP" in the last 8760 hours. CBG: Recent Labs  Lab 05/20/23 1534 05/20/23 1946 05/20/23 2357 05/21/23 0407 05/21/23 0733  GLUCAP 128* 128* 129* 123* 147*   D-Dimer: No results for input(s): "DDIMER" in the last 72 hours. Hgb A1c: No results for input(s): "HGBA1C" in the last 72 hours. Lipid Profile: No results for input(s): "CHOL", "HDL", "LDLCALC", "TRIG", "CHOLHDL", "LDLDIRECT" in the last 72 hours. Thyroid function studies: No results for input(s): "TSH", "T4TOTAL", "T3FREE", "THYROIDAB" in the last 72 hours.  Invalid input(s): "FREET3" Anemia work up: No results for input(s): "VITAMINB12", "FOLATE", "FERRITIN", "TIBC", "IRON", "RETICCTPCT" in the last 72 hours. Sepsis Labs: Recent Labs  Lab 05/17/23 0455 05/18/23 0834 05/19/23 0501 05/20/23 0537  WBC 8.7 12.4* 8.8 7.8   Microbiology Recent Results (from the past 240 hours)  MRSA Next Gen by PCR, Nasal     Status: None   Collection Time: 05/11/23  8:12 PM   Specimen: Nasal Mucosa; Nasal Swab  Result Value Ref Range Status   MRSA by PCR Next Gen NOT DETECTED NOT DETECTED Final    Comment: (NOTE) The GeneXpert MRSA Assay (FDA approved for NASAL specimens only), is one component of a comprehensive MRSA colonization surveillance program. It is not intended to diagnose MRSA infection nor to guide or monitor treatment for MRSA infections. Test performance is not FDA approved in patients less than 54 years old. Performed at [facility], [address], [place], [place] [address]   Culture, blood (Routine X 2) w Reflex to ID Panel     Status: None   Collection Time: 05/14/23  5:12 PM   Specimen: BLOOD  Result Value Ref Range Status   Specimen Description BLOOD SITE NOT SPECIFIED  Final   Special Requests   Final    BOTTLES DRAWN AEROBIC AND ANAEROBIC Blood Culture adequate volume   Culture   Final    NO GROWTH 5 DAYS Performed at [facility], [address], [place], [place] [address]    Report Status 05/19/2023 FINAL  Final  Culture, blood (Routine X 2) w Reflex to ID Panel     Status: None   Collection Time: 05/14/23  5:20 PM   Specimen: BLOOD  Result Value Ref Range Status   Specimen Description BLOOD SITE NOT SPECIFIED  Final   Special Requests   Final    BOTTLES DRAWN AEROBIC AND ANAEROBIC Blood Culture adequate volume   Culture   Final    NO GROWTH 5 DAYS Performed at [facility], [address], [place], [place] [address]    Report Status 05/19/2023 FINAL  Final  Respiratory (~20 pathogens) panel by PCR     Status: None   Collection Time: 05/15/23 12:45 PM  Result Value Ref Range Status   Adenovirus NOT DETECTED NOT DETECTED Final   Coronavirus 229E NOT DETECTED NOT DETECTED Final    Comment: (NOTE) The Coronavirus on the Respiratory Panel, DOES NOT test for the novel  Coronavirus (2019 nCoV)    Coronavirus HKU1 NOT DETECTED NOT DETECTED Final   Coronavirus NL63 NOT DETECTED NOT DETECTED Final   Coronavirus OC43 NOT DETECTED NOT DETECTED Final   Metapneumovirus NOT DETECTED NOT DETECTED Final   Rhinovirus / Enterovirus NOT DETECTED NOT DETECTED Final   Influenza A NOT DETECTED NOT DETECTED Final   Influenza B NOT DETECTED NOT DETECTED Final   Parainfluenza Virus 1 NOT DETECTED NOT DETECTED Final   Parainfluenza Virus 2 NOT DETECTED NOT DETECTED Final   Parainfluenza Virus 3 NOT DETECTED NOT DETECTED Final   Parainfluenza Virus 4 NOT DETECTED NOT DETECTED Final   Respiratory Syncytial Virus NOT DETECTED NOT DETECTED Final   Bordetella pertussis NOT DETECTED NOT DETECTED Final   Bordetella Parapertussis NOT DETECTED NOT DETECTED Final   Chlamydophila pneumoniae NOT DETECTED NOT DETECTED Final   Mycoplasma pneumoniae NOT DETECTED NOT DETECTED Final    Comment: Performed at [facility], [address], [place], [place] [address]  Expectorated Sputum Assessment w Gram Stain, Rflx to Resp  Cult     Status: None   Collection Time: 05/17/23 10:42 AM   Specimen: Sputum  Result Value Ref Range Status   Specimen Description SPUTUM  Final   Special Requests NONE  Final   Sputum evaluation   Final    THIS SPECIMEN IS ACCEPTABLE FOR SPUTUM CULTURE Performed at [facility], [address] [place], [place] [address]    Report Status 05/17/2023 FINAL  Final  Culture, Respiratory w Gram Stain     Status: None   Collection Time: 05/17/23 10:42 AM   Specimen: SPU  Result Value Ref Range Status   Specimen Description SPUTUM  Final   Special Requests NONE Reflexed from [id number]  Final   Gram Stain   Final    MODERATE WBC PRESENT, PREDOMINANTLY PMN MODERATE GRAM NEGATIVE RODS MODERATE GRAM POSITIVE COCCI Performed at [facility], [address], [place], [place] [address]    Culture MODERATE STAPHYLOCOCCUS EPIDERMIDIS  Final   Report Status 05/20/2023 FINAL  Final   Organism ID, Bacteria STAPHYLOCOCCUS EPIDERMIDIS  Final      Susceptibility   Staphylococcus epidermidis - MIC*    CIPROFLOXACIN >=8 RESISTANT Resistant     ERYTHROMYCIN >=8 RESISTANT Resistant     GENTAMICIN <=0.5 SENSITIVE Sensitive     OXACILLIN >=4 RESISTANT Resistant     TETRACYCLINE >=16 RESISTANT Resistant     VANCOMYCIN 2 SENSITIVE Sensitive     TRIMETH/SULFA 160 RESISTANT Resistant     CLINDAMYCIN <=0.25 SENSITIVE Sensitive     RIFAMPIN <=0.5 SENSITIVE Sensitive     Inducible Clindamycin NEGATIVE Sensitive     * MODERATE STAPHYLOCOCCUS EPIDERMIDIS  MRSA Next Gen by PCR, Nasal     Status: None   Collection Time: 05/17/23 10:51 AM   Specimen: Nasal Mucosa; Nasal Swab  Result Value Ref Range Status   MRSA by PCR Next Gen NOT DETECTED NOT DETECTED Final    Comment: (NOTE) The GeneXpert MRSA Assay (FDA approved for NASAL specimens only), is one component of a comprehensive MRSA colonization surveillance program. It is not intended to diagnose MRSA infection nor to guide or monitor treatment  for MRSA infections. Test performance is not FDA approved in patients less than 97 years old. Performed at [facility], [address] [place], [place] [address]   Gastrointestinal Panel by PCR , Stool     Status: None   Collection Time: 05/17/23 11:24 AM   Specimen: Stool  Result Value Ref Range Status   Campylobacter species NOT DETECTED NOT DETECTED Final   Plesimonas shigelloides NOT DETECTED NOT DETECTED Final   Salmonella species NOT DETECTED NOT DETECTED Final   Yersinia enterocolitica NOT DETECTED NOT DETECTED Final   Vibrio species NOT DETECTED NOT DETECTED Final   Vibrio cholerae NOT DETECTED NOT DETECTED Final   Enteroaggregative E coli (EAEC) NOT DETECTED NOT DETECTED Final   Enteropathogenic E coli (EPEC) NOT DETECTED NOT DETECTED Final   Enterotoxigenic E coli (ETEC) NOT DETECTED NOT DETECTED Final   Shiga like toxin producing E coli (STEC) NOT DETECTED NOT DETECTED Final   Shigella/Enteroinvasive E coli (EIEC) NOT DETECTED NOT DETECTED Final   Cryptosporidium NOT DETECTED NOT DETECTED Final   Cyclospora cayetanensis NOT DETECTED NOT DETECTED Final   Entamoeba histolytica NOT DETECTED NOT DETECTED Final   Giardia lamblia NOT DETECTED NOT DETECTED Final   Adenovirus F40/41 NOT DETECTED NOT DETECTED Final   Astrovirus NOT DETECTED NOT DETECTED Final   Norovirus GI/GII NOT DETECTED NOT DETECTED Final   Rotavirus A NOT DETECTED NOT DETECTED Final   Sapovirus (I, II, IV, and V)  NOT DETECTED NOT DETECTED Final    Comment: Performed at [facility], [address] [place], [place] [address]  Culture, blood (Routine X 2) w Reflex to ID Panel     Status: None (Preliminary result)   Collection Time: 05/17/23  4:04 PM   Specimen: BLOOD  Result Value Ref Range Status   Specimen Description BLOOD SITE NOT SPECIFIED  Final   Special Requests   Final    BOTTLES DRAWN AEROBIC AND ANAEROBIC Blood Culture results may not be optimal due to an inadequate volume of  blood received in culture bottles   Culture   Final    NO GROWTH 3 DAYS Performed at [facility], [address] [place], [place] [address]    Report Status PENDING  Incomplete  Culture, blood (Routine X 2) w Reflex to ID Panel     Status: None (Preliminary result)   Collection Time: 05/17/23  4:04 PM   Specimen: BLOOD  Result Value Ref Range Status   Specimen Description BLOOD SITE NOT SPECIFIED  Final   Special Requests   Final    BOTTLES DRAWN AEROBIC AND ANAEROBIC Blood Culture results may not be optimal due to an inadequate volume of blood received in culture bottles   Culture   Final    NO GROWTH 3 DAYS Performed at [facility], [address], [place], [place] [address]    Report Status PENDING  Incomplete  Culture, OB Urine     Status: None   Collection Time: 05/17/23  4:14 PM   Specimen: Urine, Random  Result Value Ref Range Status   Specimen Description URINE, RANDOM  Final   Special Requests NONE  Final   Culture   Final    NO GROWTH NO GROUP B STREP (S.AGALACTIAE) ISOLATED Performed at [facility], [address] [place], [place] [address]    Report Status 05/18/2023 FINAL  Final     Medications:    amLODipine  10 mg Per Tube Daily   amoxicillin-clavulanate  500 mg Per Tube Q8H   feeding supplement (PROSource TF20)  60 mL Per Tube BID   fiber supplement (BANATROL TF)  60 mL Per Tube BID   guaiFENesin  15 mL Per Tube Q6H   heparin injection (subcutaneous)  5,000 Units Subcutaneous Q12H   insulin aspart  0-6 Units Subcutaneous Q4H   multivitamin with minerals  1 tablet Per Tube Daily   mouth rinse  15 mL Mouth Rinse 4 times per day   pantoprazole (PROTONIX) IV  40 mg Intravenous QHS   Continuous Infusions:  clevidipine     feeding supplement (OSMOLITE 1.5 CAL) 1,000 mL (05/20/23 0509)      LOS: 10 days   Marinda Elk  [facility]  05/21/2023, 8:36 AM

## 2023-05-22 DIAGNOSIS — I6389 Other cerebral infarction: Secondary | ICD-10-CM | POA: Diagnosis not present

## 2023-05-22 LAB — CULTURE, BLOOD (ROUTINE X 2)
Culture: NO GROWTH
Culture: NO GROWTH

## 2023-05-22 LAB — GLUCOSE, CAPILLARY
Glucose-Capillary: 114 mg/dL — ABNORMAL HIGH (ref 70–99)
Glucose-Capillary: 126 mg/dL — ABNORMAL HIGH (ref 70–99)
Glucose-Capillary: 130 mg/dL — ABNORMAL HIGH (ref 70–99)
Glucose-Capillary: 134 mg/dL — ABNORMAL HIGH (ref 70–99)
Glucose-Capillary: 156 mg/dL — ABNORMAL HIGH (ref 70–99)

## 2023-05-22 MED ORDER — SODIUM CHLORIDE 0.9 % IV SOLN
2.0000 g | Freq: Three times a day (TID) | INTRAVENOUS | Status: DC
  Administered 2023-05-22 – 2023-06-02 (×33): 2 g via INTRAVENOUS
  Filled 2023-05-22 (×34): qty 12.5

## 2023-05-22 NOTE — Progress Notes (Signed)
 [facility] PROGRESS NOTE    Progress Note  Derek Yates  ZOX:[id number] DOB: [birth year] DOA: 05/11/2023 PCP: [name], MD     Brief Narrative:   Derek Yates is an 83 y.o. male past medical history significant for essential hypertension, CVA recurrent UTI and dementia presented to [facility] with code stroke CT of the head showed intraparenchymal hemorrhage to the right caudate extending with intraventricular lesions, CCM consulted on 05/15/2023 for fevers for 48 hours, thought to be related to aspiration pneumonia transferred to CCM on 05/17/2023. 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 Repeated CT scan on 05/13/2023 showed 6 mm basal ganglia hemorrhage which showed increase in size from previous. 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 Palliative care on board patient is a DNR/DNI working with family as he has a poor prognosis.  Aspiration pneumonia: CT chest on 05/17/2023 showed lower lobes multifocal mucous plugging and patchy airspace infiltrate concerning for aspiration pneumonia Tmax of 100.1. Continue oral Augmentin for total of 10-day course. Leukocytosis resolved. Palliative care was consulted.  Acute respiratory failure with hypoxia due to aspiration pneumonia: Now been weaned to room air. Continue chest physiotherapy and inhalers.  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 Continue statins.  Acute kidney injury: Likely hemodynamic mediated resolved with IV fluid  resuscitation.  Essential hypertension: Continue nifedipine.  Hyperlipidemia: Continue statins.  Dysphagia: Currently n.p.o. with a core track. Speech to evaluate early this week  Advance dementia: Severe memory loss  Protein-calorie malnutrition, severe noted   DVT prophylaxis: SCD Family Communication:none Status is: Inpatient Remains inpatient appropriate because: Acute intracranial hemorrhage, okay to transfer to the floor    Code Status:     Code Status Orders  (From admission, onward)           Start     Ordered   05/17/23 1008  Do not attempt resuscitation (DNR)- Limited -Do Not Intubate (DNI)  (Code Status)  Continuous       Question Answer Comment  If pulseless and not breathing No CPR or chest compressions.   In Pre-Arrest Conditions (Patient Is Breathing and Has A Pulse) Do not intubate. Provide all appropriate non-invasive medical interventions. Avoid ICU transfer unless indicated or required.   Consent: Discussion documented in EHR or advanced directives reviewed      05/17/23 1008           Code Status History     Date Active Date Inactive Code Status Order ID Comments User Context   05/11/2023 2022 05/17/2023 1008 Full Code [id number]  [name], MD Inpatient         IV Access:   Peripheral IV   Procedures and diagnostic studies:   No results found.    Medical Consultants:   None.   Subjective:    Derek Yates no complaints this morning  Objective:    Vitals:   05/22/23 0400 05/22/23 0500 05/22/23 0600 05/22/23 0722  BP: 91/60 113/61 126/73   Pulse: 89 92 97   Resp: (!) 25 (!) 24 (!) 27   Temp: 99.2 F (37.3 C)  99.3 F (  37.4 C)   TempSrc: Oral  Oral Axillary  SpO2: 95% 99% 97%   Weight:       SpO2: 97 % O2 Flow Rate (L/min): 4 L/min   Intake/Output Summary (Last 24 hours) at 05/22/2023 0814 Last data filed at 05/22/2023 0400 Gross per 24 hour  Intake 1236 ml  Output 1100 ml  Net 136 ml    Filed Weights   05/18/23 0500 05/19/23 0500 05/20/23 0338  Weight: 61.8 kg 63 kg 63.5 kg    Exam: General exam: In no acute distress. Respiratory system: Good air movement and clear to auscultation. Cardiovascular system: S1 & S2 heard, RRR. No JVD. Gastrointestinal system: Abdomen is nondistended, soft and nontender.  Extremities: No pedal edema. Skin: No rashes, lesions or ulcers  Data Reviewed:    Labs: Basic Metabolic Panel: Recent Labs  Lab 05/16/23 0528 05/17/23 0455 05/18/23 0834 05/19/23 0501 05/20/23 0537  NA 141 142 143 141 143  K 3.9 4.1 4.6 4.6 4.0  CL 102 108 105 105 107  CO2 28 29 30 25 26   GLUCOSE 169* 148* 135* 131* 113*  BUN 30* 36* 34* 39* 37*  CREATININE 0.93 0.80 0.79 0.82 0.80  CALCIUM 9.4 9.2 9.5 8.9 8.9  PHOS 3.1  --   --   --   --    GFR Estimated Creatinine Clearance: 60.6 mL/min (by C-G formula based on SCr of 0.8 mg/dL). Liver Function Tests: No results for input(s): "AST", "ALT", "ALKPHOS", "BILITOT", "PROT", "ALBUMIN" in the last 168 hours.  No results for input(s): "LIPASE", "AMYLASE" in the last 168 hours. No results for input(s): "AMMONIA" in the last 168 hours. Coagulation profile No results for input(s): "INR", "PROTIME" in the last 168 hours.  COVID-19 Labs  No results for input(s): "DDIMER", "FERRITIN", "LDH", "CRP" in the last 72 hours.  Lab Results  Component Value Date   SARSCOV2NAA Not Detected 09/14/2018    CBC: Recent Labs  Lab 05/16/23 0528 05/17/23 0455 05/18/23 0834 05/19/23 0501 05/20/23 0537  WBC 8.3 8.7 12.4* 8.8 7.8  HGB 11.6* 11.7* 11.6* 10.2* 10.8*  HCT 34.1* 33.9* 34.4* 29.7* 31.4*  MCV 82.8 82.1 83.1 83.0 82.4  PLT 258 273 287 239 319   Cardiac Enzymes: No results for input(s): "CKTOTAL", "CKMB", "CKMBINDEX", "TROPONINI" in the last 168 hours. BNP (last 3 results) No results for input(s): "PROBNP" in the last 8760 hours. CBG: Recent Labs  Lab 05/21/23 1506 05/21/23 1932 05/22/23 0005  05/22/23 0416 05/22/23 0720  GLUCAP 147* 123* 126* 134* 130*   D-Dimer: No results for input(s): "DDIMER" in the last 72 hours. Hgb A1c: No results for input(s): "HGBA1C" in the last 72 hours. Lipid Profile: No results for input(s): "CHOL", "HDL", "LDLCALC", "TRIG", "CHOLHDL", "LDLDIRECT" in the last 72 hours. Thyroid function studies: No results for input(s): "TSH", "T4TOTAL", "T3FREE", "THYROIDAB" in the last 72 hours.  Invalid input(s): "FREET3" Anemia work up: No results for input(s): "VITAMINB12", "FOLATE", "FERRITIN", "TIBC", "IRON", "RETICCTPCT" in the last 72 hours. Sepsis Labs: Recent Labs  Lab 05/17/23 0455 05/18/23 0834 05/19/23 0501 05/20/23 0537  WBC 8.7 12.4* 8.8 7.8   Microbiology Recent Results (from the past 240 hours)  Culture, blood (Routine X 2) w Reflex to ID Panel     Status: None   Collection Time: 05/14/23  5:12 PM   Specimen: BLOOD  Result Value Ref Range Status   Specimen Description BLOOD SITE NOT SPECIFIED  Final   Special Requests   Final    BOTTLES  DRAWN AEROBIC AND ANAEROBIC Blood Culture adequate volume   Culture   Final    NO GROWTH 5 DAYS Performed at [facility], [address], [place], [place] [address]    Report Status 05/19/2023 FINAL  Final  Culture, blood (Routine X 2) w Reflex to ID Panel     Status: None   Collection Time: 05/14/23  5:20 PM   Specimen: BLOOD  Result Value Ref Range Status   Specimen Description BLOOD SITE NOT SPECIFIED  Final   Special Requests   Final    BOTTLES DRAWN AEROBIC AND ANAEROBIC Blood Culture adequate volume   Culture   Final    NO GROWTH 5 DAYS Performed at [facility], [address], [place], [place] [address]    Report Status 05/19/2023 FINAL  Final  Respiratory (~20 pathogens) panel by PCR     Status: None   Collection Time: 05/15/23 12:45 PM  Result Value Ref Range Status   Adenovirus NOT DETECTED NOT DETECTED Final   Coronavirus 229E NOT DETECTED NOT DETECTED Final     Comment: (NOTE) The Coronavirus on the Respiratory Panel, DOES NOT test for the novel  Coronavirus (2019 nCoV)    Coronavirus HKU1 NOT DETECTED NOT DETECTED Final   Coronavirus NL63 NOT DETECTED NOT DETECTED Final   Coronavirus OC43 NOT DETECTED NOT DETECTED Final   Metapneumovirus NOT DETECTED NOT DETECTED Final   Rhinovirus / Enterovirus NOT DETECTED NOT DETECTED Final   Influenza A NOT DETECTED NOT DETECTED Final   Influenza B NOT DETECTED NOT DETECTED Final   Parainfluenza Virus 1 NOT DETECTED NOT DETECTED Final   Parainfluenza Virus 2 NOT DETECTED NOT DETECTED Final   Parainfluenza Virus 3 NOT DETECTED NOT DETECTED Final   Parainfluenza Virus 4 NOT DETECTED NOT DETECTED Final   Respiratory Syncytial Virus NOT DETECTED NOT DETECTED Final   Bordetella pertussis NOT DETECTED NOT DETECTED Final   Bordetella Parapertussis NOT DETECTED NOT DETECTED Final   Chlamydophila pneumoniae NOT DETECTED NOT DETECTED Final   Mycoplasma pneumoniae NOT DETECTED NOT DETECTED Final    Comment: Performed at [facility], [address], [place], [place] [address]  Expectorated Sputum Assessment w Gram Stain, Rflx to Resp Cult     Status: None   Collection Time: 05/17/23 10:42 AM   Specimen: Sputum  Result Value Ref Range Status   Specimen Description SPUTUM  Final   Special Requests NONE  Final   Sputum evaluation   Final    THIS SPECIMEN IS ACCEPTABLE FOR SPUTUM CULTURE Performed at [facility], [address] [place], [place] [address]    Report Status 05/17/2023 FINAL  Final  Culture, Respiratory w Gram Stain     Status: None   Collection Time: 05/17/23 10:42 AM   Specimen: SPU  Result Value Ref Range Status   Specimen Description SPUTUM  Final   Special Requests NONE Reflexed from [id number]  Final   Gram Stain   Final    MODERATE WBC PRESENT, PREDOMINANTLY PMN MODERATE GRAM NEGATIVE RODS MODERATE GRAM POSITIVE COCCI Performed at [facility], [address]  [place], [place] [address]    Culture MODERATE STAPHYLOCOCCUS EPIDERMIDIS  Final   Report Status 05/20/2023 FINAL  Final   Organism ID, Bacteria STAPHYLOCOCCUS EPIDERMIDIS  Final      Susceptibility   Staphylococcus epidermidis - MIC*    CIPROFLOXACIN >=8 RESISTANT Resistant     ERYTHROMYCIN >=8 RESISTANT Resistant  GENTAMICIN <=0.5 SENSITIVE Sensitive     OXACILLIN >=4 RESISTANT Resistant     TETRACYCLINE >=16 RESISTANT Resistant     VANCOMYCIN 2 SENSITIVE Sensitive     TRIMETH/SULFA 160 RESISTANT Resistant     CLINDAMYCIN <=0.25 SENSITIVE Sensitive     RIFAMPIN <=0.5 SENSITIVE Sensitive     Inducible Clindamycin NEGATIVE Sensitive     * MODERATE STAPHYLOCOCCUS EPIDERMIDIS  MRSA Next Gen by PCR, Nasal     Status: None   Collection Time: 05/17/23 10:51 AM   Specimen: Nasal Mucosa; Nasal Swab  Result Value Ref Range Status   MRSA by PCR Next Gen NOT DETECTED NOT DETECTED Final    Comment: (NOTE) The GeneXpert MRSA Assay (FDA approved for NASAL specimens only), is one component of a comprehensive MRSA colonization surveillance program. It is not intended to diagnose MRSA infection nor to guide or monitor treatment for MRSA infections. Test performance is not FDA approved in patients less than 24 years old. Performed at [facility], [address] [place], [place] [address]   Gastrointestinal Panel by PCR , Stool     Status: None   Collection Time: 05/17/23 11:24 AM   Specimen: Stool  Result Value Ref Range Status   Campylobacter species NOT DETECTED NOT DETECTED Final   Plesimonas shigelloides NOT DETECTED NOT DETECTED Final   Salmonella species NOT DETECTED NOT DETECTED Final   Yersinia enterocolitica NOT DETECTED NOT DETECTED Final   Vibrio species NOT DETECTED NOT DETECTED Final   Vibrio cholerae NOT DETECTED NOT DETECTED Final   Enteroaggregative E coli (EAEC) NOT DETECTED NOT DETECTED Final   Enteropathogenic E coli (EPEC) NOT DETECTED NOT DETECTED Final    Enterotoxigenic E coli (ETEC) NOT DETECTED NOT DETECTED Final   Shiga like toxin producing E coli (STEC) NOT DETECTED NOT DETECTED Final   Shigella/Enteroinvasive E coli (EIEC) NOT DETECTED NOT DETECTED Final   Cryptosporidium NOT DETECTED NOT DETECTED Final   Cyclospora cayetanensis NOT DETECTED NOT DETECTED Final   Entamoeba histolytica NOT DETECTED NOT DETECTED Final   Giardia lamblia NOT DETECTED NOT DETECTED Final   Adenovirus F40/41 NOT DETECTED NOT DETECTED Final   Astrovirus NOT DETECTED NOT DETECTED Final   Norovirus GI/GII NOT DETECTED NOT DETECTED Final   Rotavirus A NOT DETECTED NOT DETECTED Final   Sapovirus (I, II, IV, and V) NOT DETECTED NOT DETECTED Final    Comment: Performed at [facility], [address] [place], [place] [address]  Culture, blood (Routine X 2) w Reflex to ID Panel     Status: None (Preliminary result)   Collection Time: 05/17/23  4:04 PM   Specimen: BLOOD  Result Value Ref Range Status   Specimen Description BLOOD SITE NOT SPECIFIED  Final   Special Requests   Final    BOTTLES DRAWN AEROBIC AND ANAEROBIC Blood Culture results may not be optimal due to an inadequate volume of blood received in culture bottles   Culture   Final    NO GROWTH 4 DAYS Performed at [facility], [address], [place], [place] [address]    Report Status PENDING  Incomplete  Culture, blood (Routine X 2) w Reflex to ID Panel     Status: None (Preliminary result)   Collection Time: 05/17/23  4:04 PM   Specimen: BLOOD  Result Value Ref Range Status   Specimen Description BLOOD SITE NOT SPECIFIED  Final   Special Requests   Final    BOTTLES DRAWN AEROBIC AND ANAEROBIC  Blood Culture results may not be optimal due to an inadequate volume of blood received in culture bottles   Culture   Final    NO GROWTH 4 DAYS Performed at [facility], [address] [place], [place] [address]    Report Status PENDING  Incomplete  Culture, OB Urine     Status:  None   Collection Time: 05/17/23  4:14 PM   Specimen: Urine, Random  Result Value Ref Range Status   Specimen Description URINE, RANDOM  Final   Special Requests NONE  Final   Culture   Final    NO GROWTH NO GROUP B STREP (S.AGALACTIAE) ISOLATED Performed at [facility], [address], [place], [place] [address]    Report Status 05/18/2023 FINAL  Final     Medications:    amLODipine  10 mg Per Tube Daily   amoxicillin-clavulanate  500 mg Per Tube Q8H   feeding supplement (PROSource TF20)  60 mL Per Tube BID   fiber supplement (BANATROL TF)  60 mL Per Tube BID   guaiFENesin  15 mL Per Tube Q6H   heparin injection (subcutaneous)  5,000 Units Subcutaneous Q12H   insulin aspart  0-6 Units Subcutaneous Q4H   multivitamin with minerals  1 tablet Per Tube Daily   mouth rinse  15 mL Mouth Rinse 4 times per day   pantoprazole (PROTONIX) IV  40 mg Intravenous QHS   Continuous Infusions:  clevidipine     feeding supplement (OSMOLITE 1.5 CAL) 40 mL/hr at 05/22/23 0400      LOS: 11 days   Marinda Elk  [facility]  05/22/2023, 8:14 AM

## 2023-05-22 NOTE — Progress Notes (Signed)
 Patient with Red MEWS. Vitals taken shortly after bed bath. [name] HR came down to 96bpm, RR 24bpm 15 minutes after vitals taken. PRN tylenol given. Dr. David Stall and charge nurse Ginger notified, no new orders.    05/22/23 1132  Assess: MEWS Score  Temp (!) 101.3 F (38.5 C)  BP 131/68  MAP (mmHg) 85  Pulse Rate (!) 101  ECG Heart Rate (!) 101  Resp (!) 29  SpO2 98 %  O2 Device Nasal Cannula  O2 Flow Rate (L/min) 4 L/min  Assess: MEWS Score  MEWS Temp 1  MEWS Systolic 0  MEWS Pulse 1  MEWS RR 2  MEWS LOC 0  MEWS Score 4  MEWS Score Color Red  Assess: if the MEWS score is Yellow or Red  Were vital signs accurate and taken at a resting state? No, vital signs rechecked  Does the patient meet 2 or more of the SIRS criteria? Yes  Does the patient have a confirmed or suspected source of infection? Yes  MEWS guidelines implemented  Yes, red  Treat  MEWS Interventions Considered administering scheduled or prn medications/treatments as ordered  Take Vital Signs  Increase Vital Sign Frequency  Red: Q1hr x2, continue Q4hrs until patient remains green for 12hrs  Escalate  MEWS: Escalate Red: Discuss with charge nurse and notify provider. Consider notifying RRT. If remains red for 2 hours consider need for higher level of care  Notify: Charge Nurse/RN  Name of Charge Nurse/RN Notified Ginger RN  Provider Notification  Provider Name/Title Dr. Marinda Elk  Date Provider Notified 05/22/23  Time Provider Notified 1140  Method of Notification Page (securechat)  Notification Reason Other (Comment) (Red MEWS)  Provider response No new orders  Date of Provider Response 05/22/23  Time of Provider Response 1140  Assess: SIRS CRITERIA  SIRS Temperature  1  SIRS Respirations  1  SIRS Pulse 1  SIRS WBC 0  SIRS Score Sum  3

## 2023-05-23 DIAGNOSIS — I6389 Other cerebral infarction: Secondary | ICD-10-CM | POA: Diagnosis not present

## 2023-05-23 LAB — CBC WITH DIFFERENTIAL/PLATELET
Abs Immature Granulocytes: 0.07 10*3/uL (ref 0.00–0.07)
Basophils Absolute: 0.1 10*3/uL (ref 0.0–0.1)
Basophils Relative: 0 %
Eosinophils Absolute: 0.1 10*3/uL (ref 0.0–0.5)
Eosinophils Relative: 1 %
HCT: 34.8 % — ABNORMAL LOW (ref 39.0–52.0)
Hemoglobin: 11.7 g/dL — ABNORMAL LOW (ref 13.0–17.0)
Immature Granulocytes: 1 %
Lymphocytes Relative: 21 %
Lymphs Abs: 2.4 10*3/uL (ref 0.7–4.0)
MCH: 28 pg (ref 26.0–34.0)
MCHC: 33.6 g/dL (ref 30.0–36.0)
MCV: 83.3 fL (ref 80.0–100.0)
Monocytes Absolute: 0.6 10*3/uL (ref 0.1–1.0)
Monocytes Relative: 6 %
Neutro Abs: 8.1 10*3/uL — ABNORMAL HIGH (ref 1.7–7.7)
Neutrophils Relative %: 71 %
Platelets: 433 10*3/uL — ABNORMAL HIGH (ref 150–400)
RBC: 4.18 MIL/uL — ABNORMAL LOW (ref 4.22–5.81)
RDW: 15.5 % (ref 11.5–15.5)
WBC: 11.4 10*3/uL — ABNORMAL HIGH (ref 4.0–10.5)
nRBC: 0 % (ref 0.0–0.2)

## 2023-05-23 LAB — BASIC METABOLIC PANEL
Anion gap: 9 (ref 5–15)
BUN: 33 mg/dL — ABNORMAL HIGH (ref 8–23)
CO2: 23 mmol/L (ref 22–32)
Calcium: 8.9 mg/dL (ref 8.9–10.3)
Chloride: 106 mmol/L (ref 98–111)
Creatinine, Ser: 0.86 mg/dL (ref 0.61–1.24)
GFR, Estimated: >60 mL/min (ref >60)
Glucose, Bld: 178 mg/dL — ABNORMAL HIGH (ref 70–99)
Potassium: 3.9 mmol/L (ref 3.5–5.1)
Sodium: 138 mmol/L (ref 135–145)

## 2023-05-23 MED ORDER — METRONIDAZOLE 500 MG/100ML IV SOLN
500.0000 mg | Freq: Two times a day (BID) | INTRAVENOUS | Status: DC
  Administered 2023-05-23 – 2023-06-02 (×21): 500 mg via INTRAVENOUS
  Filled 2023-05-23 (×21): qty 100

## 2023-05-23 NOTE — Progress Notes (Signed)
 Physical Therapy Treatment Patient Details Name: Derek Yates MRN: [id number] DOB: Nov 07, 1940 Today's Date: 05/23/2023   History of Present Illness 83 yo male presents to [facility] on 2/19 as code stroke for speech deficits, L weakness. 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 PMH includes dementia, recurrent UTIs, CVA, HTN.    PT Comments  The pt was seen for continued progression of OOB mobility and balance training. He continues to require significant assistance to complete bed mobility and maintain sitting balance initially due to L lateral and posterior lean and increased processing time with commands. The pt was able to progress to static sitting with CGA only, but needed max cues and facilitation of R lateral lean and forward trunk flexion first. He continues to need +2 assistance for OOB mobility and use of stedy, continue to recommend inpatient rehab <3hours/day to facilitate improvement towards maximal level of independence.     If plan is discharge home, recommend the following: Two people to help with walking and/or transfers;Two people to help with bathing/dressing/bathroom   Can travel by private vehicle     No  Equipment Recommendations  Wheelchair (measurements PT);Wheelchair cushion (measurements PT);Hospital bed;Hoyer lift    Recommendations for Other Services       Precautions / Restrictions Precautions Precautions: Fall Recall of Precautions/Restrictions: Impaired Precaution/Restrictions Comments: NG Feeding tube Restrictions Weight Bearing Restrictions Per Provider Order: No     Mobility  Bed Mobility Overal bed mobility: Needs Assistance Bed Mobility: Supine to Sit Rolling: Total assist, +2 for safety/equipment         General bed mobility comments: totalA with pt resisting at times, assist to facilitate trunk flexion to prevent hips sliding out of bed. pt with strong lean to L with initial transition to sitting.     Transfers Overall transfer level: Needs assistance Equipment used: Ambulation equipment used Transfers: Sit to/from Stand, Bed to chair/wheelchair/BSC Sit to Stand: Max assist, +2 physical assistance, +2 safety/equipment, From elevated surface, Via lift equipment Stand pivot transfers: Total assist, +2 safety/equipment         General transfer comment: assist to facilitate hand placement then to raise hips from bed, assist to facilitate hip extension and posture at midline, increased time to rise Transfer via Lift Equipment: Stedy  Ambulation/Gait               General Gait Details: unable at this time    Modified Rankin (Stroke Patients Only) Modified Rankin (Stroke Patients Only) Pre-Morbid Rankin Score: Slight disability Modified Rankin: Severe disability     Balance Overall balance assessment: Needs assistance Sitting-balance support: Feet supported, Bilateral upper extremity supported Sitting balance-Leahy Scale: Poor Sitting balance - Comments: leans to L initially with pt pushing on R hand, able to work on leaning down to R elbow x5, eventually able to maintain short bouts of sitting without assistance. worked on forward trunk flexion from sitting x10, more participation with RUE reaching comparedto LUE Postural control: Posterior lean, Left lateral lean Standing balance support: Bilateral upper extremity supported Standing balance-Leahy Scale: Zero Standing balance comment: dependent on external support and UE support on stedy                            Communication Communication Communication: Impaired Factors Affecting Communication: Difficulty expressing self;Reduced clarity of speech  Cognition Arousal: Lethargic Behavior During Therapy: Flat affect   PT - Cognitive impairments: Attention, Initiation  PT - Cognition Comments: pt opening eyes with increased cues, decreased awareness of position, posture,  deficits. increased processing time Following commands: Impaired Following commands impaired: Follows one step commands inconsistently, Follows one step commands with increased time    Cueing Cueing Techniques: Verbal cues, Tactile cues, Gestural cues, Visual cues  Exercises Other Exercises Other Exercises: sitting balance/positioning with leaning to R with head turns Other Exercises: sitting EOB with trunk flexion and UE reaching x10, hand over hand to reach with LUE Other Exercises: seated marches x5 with 10 sec hold    General Comments General comments (skin integrity, edema, etc.): VSS on RA, wife present and supportive      Pertinent Vitals/Pain Pain Assessment Pain Assessment: Faces Faces Pain Scale: Hurts little more Pain Location: indicated by pushing to avoid leaning R Pain Descriptors / Indicators: Grimacing, Discomfort Pain Intervention(s): Limited activity within patient's tolerance, Monitored during session, Repositioned     PT Goals (current goals can now be found in the care plan section) Acute Rehab PT Goals PT Goal Formulation: With patient/family Time For Goal Achievement: 05/27/23 Potential to Achieve Goals: Fair Progress towards PT goals: Progressing toward goals    Frequency    Min 1X/week       AM-PAC PT "6 Clicks" Mobility   Outcome Measure  Help needed turning from your back to your side while in a flat bed without using bedrails?: Total Help needed moving from lying on your back to sitting on the side of a flat bed without using bedrails?: Total Help needed moving to and from a bed to a chair (including a wheelchair)?: Total Help needed standing up from a chair using your arms (e.g., wheelchair or bedside chair)?: Total Help needed to walk in hospital room?: Total Help needed climbing 3-5 steps with a railing? : Total 6 Click Score: 6    End of Session Equipment Utilized During Treatment: Gait belt (stedy) Activity Tolerance: Patient  tolerated treatment well Patient left: in chair;with call bell/phone within reach;with chair alarm set;with family/visitor present Nurse Communication: Mobility status;Need for lift equipment PT Visit Diagnosis: Other abnormalities of gait and mobility (R26.89);Muscle weakness (generalized) (M62.81)     Time: 4132-4401 PT Time Calculation (min) (ACUTE ONLY): 34 min  Charges:    $Therapeutic Activity: 8-22 mins $Neuromuscular Re-education: 8-22 mins PT General Charges $$ ACUTE PT VISIT: 1 Visit                     Vickki Muff, PT, DPT   Acute Rehabilitation Department Office [phone] Secure Chat Communication Preferred   Ronnie Derby 05/23/2023, 12:24 PM

## 2023-05-23 NOTE — Progress Notes (Signed)
 [facility] PROGRESS NOTE    Progress Note  [name]  [name]:[id number] DOB: May 27, 1940 DOA: 05/11/2023 PCP: [name], MD     Brief Narrative:   Derek Yates is an 83 y.o. male past medical history significant for essential hypertension, CVA recurrent UTI and dementia presented to [facility] with code stroke CT of the head showed intraparenchymal hemorrhage to the right caudate extending with intraventricular lesions, CCM consulted on 05/15/2023 for fevers for 48 hours, thought to be related to aspiration pneumonia transferred to CCM on 05/17/2023. 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 Repeated CT scan on 05/13/2023 showed 6 mm basal ganglia hemorrhage which showed increase in size from previous. 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 Palliative care on board patient is a DNR/DNI working with family as he has a poor prognosis.  Aspiration pneumonia: CT chest on 05/17/2023 showed lower lobes multifocal mucous plugging and patchy airspace infiltrate concerning for aspiration pneumonia Tmax of one 101.3, this possibly due to recurrent aspiration. Fever transition to IV pain.  Check a CBC with differential. Palliative care was consulted.  Acute respiratory failure with hypoxia due to aspiration pneumonia: Now been weaned to room air. Continue chest physiotherapy and inhalers.  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 Continue statins.  Acute kidney injury: Likely hemodynamic mediated resolved  with IV fluid resuscitation.  Essential hypertension: Continue nifedipine.  Hyperlipidemia: Continue statins.  Dysphagia: Currently n.p.o. with a core track. Speech to evaluate early this week  Advance dementia: Severe memory loss  Protein-calorie malnutrition, severe Noted.   DVT prophylaxis: SCD Family Communication:none Status is: Inpatient Remains inpatient appropriate because: Acute intracranial hemorrhage, okay to transfer to the floor    Code Status:     Code Status Orders  (From admission, onward)           Start     Ordered   05/17/23 1008  Do not attempt resuscitation (DNR)- Limited -Do Not Intubate (DNI)  (Code Status)  Continuous       Question Answer Comment  If pulseless and not breathing No CPR or chest compressions.   In Pre-Arrest Conditions (Patient Is Breathing and Has A Pulse) Do not intubate. Provide all appropriate non-invasive medical interventions. Avoid ICU transfer unless indicated or required.   Consent: Discussion documented in EHR or advanced directives reviewed      05/17/23 1008           Code Status History     Date Active Date Inactive Code Status Order ID Comments User Context   05/11/2023 2022 05/17/2023 1008 Full Code [id number]  [name], MD Inpatient         IV Access:   Peripheral IV   Procedures and diagnostic studies:   No results found.    Medical Consultants:   None.   Subjective:    Derek Yates no complaints.  Objective:    Vitals:   05/22/23 1918 05/22/23 2325 05/23/23 0300 05/23/23 0500  BP: 128/69 125/66    Pulse: 93 (!) 107 100   Resp: (!) 22 18 18    Temp: 98.8 F (  37.1 C) 99.5 F (37.5 C) 99 F (37.2 C)   TempSrc: Axillary Oral Oral   SpO2: 97% 95%    Weight:    63.5 kg   SpO2: 95 % O2 Flow Rate (L/min): 4 L/min   Intake/Output Summary (Last 24 hours) at 05/23/2023 0806 Last data filed at 05/23/2023 0400 Gross per 24 hour  Intake 654.67 ml  Output 1900  ml  Net -1245.33 ml   Filed Weights   05/19/23 0500 05/20/23 0338 05/23/23 0500  Weight: 63 kg 63.5 kg 63.5 kg    Exam: General exam: In no acute distress. Respiratory system: Good air movement and clear to auscultation. Cardiovascular system: S1 & S2 heard, RRR. No JVD. Gastrointestinal system: Abdomen is nondistended, soft and nontender.  Extremities: No pedal edema. Skin: No rashes, lesions or ulcers  Data Reviewed:    Labs: Basic Metabolic Panel: Recent Labs  Lab 05/17/23 0455 05/18/23 0834 05/19/23 0501 05/20/23 0537  NA 142 143 141 143  K 4.1 4.6 4.6 4.0  CL 108 105 105 107  CO2 29 30 25 26   GLUCOSE 148* 135* 131* 113*  BUN 36* 34* 39* 37*  CREATININE 0.80 0.79 0.82 0.80  CALCIUM 9.2 9.5 8.9 8.9   GFR Estimated Creatinine Clearance: 60.6 mL/min (by C-G formula based on SCr of 0.8 mg/dL). Liver Function Tests: No results for input(s): "AST", "ALT", "ALKPHOS", "BILITOT", "PROT", "ALBUMIN" in the last 168 hours.  No results for input(s): "LIPASE", "AMYLASE" in the last 168 hours. No results for input(s): "AMMONIA" in the last 168 hours. Coagulation profile No results for input(s): "INR", "PROTIME" in the last 168 hours.  COVID-19 Labs  No results for input(s): "DDIMER", "FERRITIN", "LDH", "CRP" in the last 72 hours.  Lab Results  Component Value Date   SARSCOV2NAA Not Detected 09/14/2018    CBC: Recent Labs  Lab 05/17/23 0455 05/18/23 0834 05/19/23 0501 05/20/23 0537  WBC 8.7 12.4* 8.8 7.8  HGB 11.7* 11.6* 10.2* 10.8*  HCT 33.9* 34.4* 29.7* 31.4*  MCV 82.1 83.1 83.0 82.4  PLT 273 287 239 319   Cardiac Enzymes: No results for input(s): "CKTOTAL", "CKMB", "CKMBINDEX", "TROPONINI" in the last 168 hours. BNP (last 3 results) No results for input(s): "PROBNP" in the last 8760 hours. CBG: Recent Labs  Lab 05/22/23 0005 05/22/23 0416 05/22/23 0720 05/22/23 1130 05/22/23 1523  GLUCAP 126* 134* 130* 114* 156*   D-Dimer: No results for  input(s): "DDIMER" in the last 72 hours. Hgb A1c: No results for input(s): "HGBA1C" in the last 72 hours. Lipid Profile: No results for input(s): "CHOL", "HDL", "LDLCALC", "TRIG", "CHOLHDL", "LDLDIRECT" in the last 72 hours. Thyroid function studies: No results for input(s): "TSH", "T4TOTAL", "T3FREE", "THYROIDAB" in the last 72 hours.  Invalid input(s): "FREET3" Anemia work up: No results for input(s): "VITAMINB12", "FOLATE", "FERRITIN", "TIBC", "IRON", "RETICCTPCT" in the last 72 hours. Sepsis Labs: Recent Labs  Lab 05/17/23 0455 05/18/23 0834 05/19/23 0501 05/20/23 0537  WBC 8.7 12.4* 8.8 7.8   Microbiology Recent Results (from the past 240 hours)  Culture, blood (Routine X 2) w Reflex to ID Panel     Status: None   Collection Time: 05/14/23  5:12 PM   Specimen: BLOOD  Result Value Ref Range Status   Specimen Description BLOOD SITE NOT SPECIFIED  Final   Special Requests   Final    BOTTLES DRAWN AEROBIC AND ANAEROBIC Blood Culture adequate volume   Culture   Final    NO GROWTH 5 DAYS Performed at  [facility], [address] [place], [place] [address]    Report Status 05/19/2023 FINAL  Final  Culture, blood (Routine X 2) w Reflex to ID Panel     Status: None   Collection Time: 05/14/23  5:20 PM   Specimen: BLOOD  Result Value Ref Range Status   Specimen Description BLOOD SITE NOT SPECIFIED  Final   Special Requests   Final    BOTTLES DRAWN AEROBIC AND ANAEROBIC Blood Culture adequate volume   Culture   Final    NO GROWTH 5 DAYS Performed at [facility], [address] [place], [place] [address]    Report Status 05/19/2023 FINAL  Final  Respiratory (~20 pathogens) panel by PCR     Status: None   Collection Time: 05/15/23 12:45 PM  Result Value Ref Range Status   Adenovirus NOT DETECTED NOT DETECTED Final   Coronavirus 229E NOT DETECTED NOT DETECTED Final    Comment: (NOTE) The Coronavirus on the Respiratory Panel, DOES NOT test for the novel   Coronavirus (2019 nCoV)    Coronavirus HKU1 NOT DETECTED NOT DETECTED Final   Coronavirus NL63 NOT DETECTED NOT DETECTED Final   Coronavirus OC43 NOT DETECTED NOT DETECTED Final   Metapneumovirus NOT DETECTED NOT DETECTED Final   Rhinovirus / Enterovirus NOT DETECTED NOT DETECTED Final   Influenza A NOT DETECTED NOT DETECTED Final   Influenza B NOT DETECTED NOT DETECTED Final   Parainfluenza Virus 1 NOT DETECTED NOT DETECTED Final   Parainfluenza Virus 2 NOT DETECTED NOT DETECTED Final   Parainfluenza Virus 3 NOT DETECTED NOT DETECTED Final   Parainfluenza Virus 4 NOT DETECTED NOT DETECTED Final   Respiratory Syncytial Virus NOT DETECTED NOT DETECTED Final   Bordetella pertussis NOT DETECTED NOT DETECTED Final   Bordetella Parapertussis NOT DETECTED NOT DETECTED Final   Chlamydophila pneumoniae NOT DETECTED NOT DETECTED Final   Mycoplasma pneumoniae NOT DETECTED NOT DETECTED Final    Comment: Performed at [facility], [address], [place], [place] [address]  Expectorated Sputum Assessment w Gram Stain, Rflx to Resp Cult     Status: None   Collection Time: 05/17/23 10:42 AM   Specimen: Sputum  Result Value Ref Range Status   Specimen Description SPUTUM  Final   Special Requests NONE  Final   Sputum evaluation   Final    THIS SPECIMEN IS ACCEPTABLE FOR SPUTUM CULTURE Performed at [facility], [address] [place], [place] [address]    Report Status 05/17/2023 FINAL  Final  Culture, Respiratory w Gram Stain     Status: None   Collection Time: 05/17/23 10:42 AM   Specimen: SPU  Result Value Ref Range Status   Specimen Description SPUTUM  Final   Special Requests NONE Reflexed from [id number]  Final   Gram Stain   Final    MODERATE WBC PRESENT, PREDOMINANTLY PMN MODERATE GRAM NEGATIVE RODS MODERATE GRAM POSITIVE COCCI Performed at [facility], [address], [place], [place] [address]    Culture MODERATE STAPHYLOCOCCUS EPIDERMIDIS  Final   Report Status  05/20/2023 FINAL  Final   Organism ID, Bacteria STAPHYLOCOCCUS EPIDERMIDIS  Final      Susceptibility   Staphylococcus epidermidis - MIC*    CIPROFLOXACIN >=8 RESISTANT Resistant     ERYTHROMYCIN >=8 RESISTANT Resistant     GENTAMICIN <=0.5 SENSITIVE Sensitive     OXACILLIN >=4 RESISTANT Resistant     TETRACYCLINE >=16 RESISTANT Resistant  VANCOMYCIN 2 SENSITIVE Sensitive     TRIMETH/SULFA 160 RESISTANT Resistant     CLINDAMYCIN <=0.25 SENSITIVE Sensitive     RIFAMPIN <=0.5 SENSITIVE Sensitive     Inducible Clindamycin NEGATIVE Sensitive     * MODERATE STAPHYLOCOCCUS EPIDERMIDIS  MRSA Next Gen by PCR, Nasal     Status: None   Collection Time: 05/17/23 10:51 AM   Specimen: Nasal Mucosa; Nasal Swab  Result Value Ref Range Status   MRSA by PCR Next Gen NOT DETECTED NOT DETECTED Final    Comment: (NOTE) The GeneXpert MRSA Assay (FDA approved for NASAL specimens only), is one component of a comprehensive MRSA colonization surveillance program. It is not intended to diagnose MRSA infection nor to guide or monitor treatment for MRSA infections. Test performance is not FDA approved in patients less than 52 years old. Performed at [facility], [address] [place], [place] [address]   Gastrointestinal Panel by PCR , Stool     Status: None   Collection Time: 05/17/23 11:24 AM   Specimen: Stool  Result Value Ref Range Status   Campylobacter species NOT DETECTED NOT DETECTED Final   Plesimonas shigelloides NOT DETECTED NOT DETECTED Final   Salmonella species NOT DETECTED NOT DETECTED Final   Yersinia enterocolitica NOT DETECTED NOT DETECTED Final   Vibrio species NOT DETECTED NOT DETECTED Final   Vibrio cholerae NOT DETECTED NOT DETECTED Final   Enteroaggregative E coli (EAEC) NOT DETECTED NOT DETECTED Final   Enteropathogenic E coli (EPEC) NOT DETECTED NOT DETECTED Final   Enterotoxigenic E coli (ETEC) NOT DETECTED NOT DETECTED Final   Shiga like toxin producing E coli  (STEC) NOT DETECTED NOT DETECTED Final   Shigella/Enteroinvasive E coli (EIEC) NOT DETECTED NOT DETECTED Final   Cryptosporidium NOT DETECTED NOT DETECTED Final   Cyclospora cayetanensis NOT DETECTED NOT DETECTED Final   Entamoeba histolytica NOT DETECTED NOT DETECTED Final   Giardia lamblia NOT DETECTED NOT DETECTED Final   Adenovirus F40/41 NOT DETECTED NOT DETECTED Final   Astrovirus NOT DETECTED NOT DETECTED Final   Norovirus GI/GII NOT DETECTED NOT DETECTED Final   Rotavirus A NOT DETECTED NOT DETECTED Final   Sapovirus (I, II, IV, and V) NOT DETECTED NOT DETECTED Final    Comment: Performed at [facility], [address] [place], [place] [address]  Culture, blood (Routine X 2) w Reflex to ID Panel     Status: None   Collection Time: 05/17/23  4:04 PM   Specimen: BLOOD  Result Value Ref Range Status   Specimen Description BLOOD SITE NOT SPECIFIED  Final   Special Requests   Final    BOTTLES DRAWN AEROBIC AND ANAEROBIC Blood Culture results may not be optimal due to an inadequate volume of blood received in culture bottles   Culture   Final    NO GROWTH 5 DAYS Performed at [facility], [address] [place], [place] [address]    Report Status 05/22/2023 FINAL  Final  Culture, blood (Routine X 2) w Reflex to ID Panel     Status: None   Collection Time: 05/17/23  4:04 PM   Specimen: BLOOD  Result Value Ref Range Status   Specimen Description BLOOD SITE NOT SPECIFIED  Final   Special Requests   Final    BOTTLES DRAWN AEROBIC AND ANAEROBIC Blood Culture results may not be optimal due to an inadequate volume of blood received in culture bottles   Culture   Final  NO GROWTH 5 DAYS Performed at [facility], [address] [place], [place] [address]    Report Status 05/22/2023 FINAL  Final  Culture, OB Urine     Status: None   Collection Time: 05/17/23  4:14 PM   Specimen: Urine, Random  Result Value Ref Range Status   Specimen Description URINE,  RANDOM  Final   Special Requests NONE  Final   Culture   Final    NO GROWTH NO GROUP B STREP (S.AGALACTIAE) ISOLATED Performed at [facility], [address], [place], [place] [address]    Report Status 05/18/2023 FINAL  Final     Medications:    amLODipine  10 mg Per Tube Daily   feeding supplement (PROSource TF20)  60 mL Per Tube BID   fiber supplement (BANATROL TF)  60 mL Per Tube BID   guaiFENesin  15 mL Per Tube Q6H   heparin injection (subcutaneous)  5,000 Units Subcutaneous Q12H   multivitamin with minerals  1 tablet Per Tube Daily   mouth rinse  15 mL Mouth Rinse 4 times per day   pantoprazole (PROTONIX) IV  40 mg Intravenous QHS   Continuous Infusions:  ceFEPime (MAXIPIME) IV 2 g (05/23/23 0551)   feeding supplement (OSMOLITE 1.5 CAL) 40 mL/hr at 05/22/23 1752      LOS: 12 days   Marinda Elk  [facility]  05/23/2023, 8:06 AM

## 2023-05-23 NOTE — Progress Notes (Signed)
 Nutrition Follow-up  DOCUMENTATION CODES:  Severe malnutrition in context of chronic illness  INTERVENTION:  Continue tube feeding via Cortrak tube: Osmolite 1.5 at 40 ml/h (960 ml per day) Prosource TF20 60 ml BID   Provides 1600 kcal, 100 gm protein, 729 ml free water daily   MVI with minerals daily via tube  Banatrol BID, each packet provides 5g soluble fiber Monitor for PEG tube placement and adjust to bolus feeds, if indicated  NUTRITION DIAGNOSIS:  Severe Malnutrition related to chronic illness as evidenced by severe fat depletion, severe muscle depletion. 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  2/19 admitted w/ R caudate bleed 2/21 - s/p cortrak placement; tip gastric  2/22 - bedside swallow: NPO, CT head, CXR 2/25 - CT of chest: PNA 2/26 - transfer to 4NP  Pt mentation improved. Per SLP, remains appropriate for NPO. Continuing to assess. Expected discharge to SNF. Will need PEG. Neurology on board.  TF running at goal rate. Pt with no N/V. Continues with some diarrhea, however slowing. Fecal management system removed 2/28. Will monitor loose stools and increase Banatrol, if needed.   Admit Weight: 61.9kg Current Weight: 63.5kg  Wt stable. No edema noted on exam. 1.9L UOP yesterday. AKI resolved with fluid resuscitation.   Meds: MVI, pantoprazole, IV ABX   Labs:  Na 138 (wdl) CBGs 113-178 x 2 draws over 3 days A1c 5.7 (04/2023)  Diet Order:   Diet Order             Diet NPO time specified Except for: Other (See Comments)  Diet effective now            EDUCATION NEEDS:  Not appropriate for education at this  time  Skin:  Skin Assessment: Reviewed RN Assessment  Last BM:  3/3 - type 7  Height:  Ht Readings from Last 1 Encounters:  05/11/23 5\' 8"  (1.727 m)   Weight:  Wt Readings from Last 1 Encounters:  05/23/23 63.5 kg   Ideal Body Weight:  70 kg  BMI:  Body mass index is 21.29 kg/m.  Estimated Nutritional Needs:   Kcal:  1600-1800  Protein:  90-100 grams  Fluid:  >1.6 L/day  Myrtie Cruise MS, RD, LDN Registered Dietitian Clinical Nutrition RD Inpatient Contact Info in Amion

## 2023-05-23 NOTE — TOC Progression Note (Signed)
 Transition of Care [facility]) - Progression Note    Patient Details  Name: Derek Yates MRN: [id number] Date of Birth: Jan 07, 1941  Transition of [facility]) CM/SW Contact  Eduard Roux, [place] Phone Number: 05/23/2023, 9:57 AM  Clinical Narrative:     TOC continues to follow   Expected Discharge Plan: Skilled Nursing Facility Barriers to Discharge: Continued Medical Work up, SNF Pending bed offer, Insurance Authorization  Expected Discharge Plan and Services   Discharge Planning Services: CM Consult Post Acute Care Choice: Skilled Nursing Facility Living arrangements for the past 2 months: Single Family Home                                       Social Determinants of Health (SDOH) Interventions SDOH Screenings   Food Insecurity: Patient Unable To Answer (05/13/2023)  Housing: Patient Unable To Answer (05/13/2023)  Transportation Needs: Patient Unable To Answer (05/13/2023)  Utilities: Patient Unable To Answer (05/13/2023)  Depression (PHQ2-9): Medium Risk (02/10/2023)  Financial Resource Strain: Low Risk  (06/20/2021)   Received from [facility], [facility]  Social Connections: Patient Unable To Answer (05/13/2023)  Tobacco Use: Low Risk  (05/04/2023)    Readmission Risk Interventions     No data to display

## 2023-05-23 NOTE — Progress Notes (Signed)
 Speech Language Pathology Treatment: Dysphagia  Patient Details Name: Derek Yates MRN: [id number] DOB: 1940/07/22 Today's Date: 05/23/2023 Time: 7829-5621 SLP Time Calculation (min) (ACUTE ONLY): 13 min  Assessment / Plan / Recommendation Clinical Impression  Pt presents with improved alertness this date while sitting upright in the chair. He answered questions regarding wants/needs and conversed with his family. Pt continues to have prolonged bouts of coughing with tspns of water, although subjectively his cough and voice are decreasingly wet in quality. Recommend his NPO status be maintained except for 2-3 ice chips following thorough oral care with nursing staff only. SLP will continue following to assess readiness to participate in an instrumental swallow study given improving mentation this date.    HPI HPI: Patient is an 83 y.o. male with PMH: dementia, recurrent UTI's, CVA, HTN. He presented to the hospital on 05/11/23 as a code stroke with speech deficits and left sided weakness. 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 Patient has been NPO; Cortrak placed 2/21. He failed [facility] swallow screen with RN.      SLP Plan  Continue with current plan of care      Recommendations for follow up therapy are one component of a multi-disciplinary discharge planning process, led by the attending physician.  Recommendations may be updated based on patient status, additional functional criteria and insurance authorization.    Recommendations  Diet recommendations: NPO Medication Administration: Via alternative means                  Oral care QID;Oral care prior to ice chip/H20   Frequent or constant Supervision/Assistance Dysphagia, unspecified (R13.10)     Continue with current plan of care     Gwynneth Aliment, M.A., CF-SLP Speech Language Pathology, Acute  Rehabilitation Services  Secure Chat preferred [phone]   05/23/2023, 4:23 PM

## 2023-05-23 NOTE — Plan of Care (Signed)
 Problem: Education: Goal: Knowledge of General Education information will improve Description: Including pain rating scale, medication(s)/side effects and non-pharmacologic comfort measures 05/23/2023 0557 by Carlene Coria, RN Outcome: Progressing 05/22/2023 2033 by Carlene Coria, RN Outcome: Progressing   Problem: Health Behavior/Discharge Planning: Goal: Ability to manage health-related needs will improve 05/23/2023 0557 by Carlene Coria, RN Outcome: Progressing 05/22/2023 2033 by Carlene Coria, RN Outcome: Progressing   Problem: Clinical Measurements: Goal: Ability to maintain clinical measurements within normal limits will improve 05/23/2023 0557 by Carlene Coria, RN Outcome: Progressing 05/22/2023 2033 by Carlene Coria, RN Outcome: Progressing Goal: Will remain free from infection 05/23/2023 0557 by Carlene Coria, RN Outcome: Progressing 05/22/2023 2033 by Carlene Coria, RN Outcome: Progressing Goal: Diagnostic test results will improve 05/23/2023 0557 by Carlene Coria, RN Outcome: Progressing 05/22/2023 2033 by Carlene Coria, RN Outcome: Progressing Goal: Respiratory complications will improve 05/23/2023 0557 by Carlene Coria, RN Outcome: Progressing 05/22/2023 2033 by Carlene Coria, RN Outcome: Progressing Goal: Cardiovascular complication will be avoided 05/23/2023 0557 by Carlene Coria, RN Outcome: Progressing 05/22/2023 2033 by Carlene Coria, RN Outcome: Progressing   Problem: Activity: Goal: Risk for activity intolerance will decrease 05/23/2023 0557 by Carlene Coria, RN Outcome: Progressing 05/22/2023 2033 by Carlene Coria, RN Outcome: Progressing   Problem: Nutrition: Goal: Adequate nutrition will be maintained 05/23/2023 0557 by Carlene Coria, RN Outcome: Progressing 05/22/2023 2033 by Carlene Coria, RN Outcome: Progressing   Problem: Coping: Goal: Level of anxiety will decrease 05/23/2023  0557 by Carlene Coria, RN Outcome: Progressing 05/22/2023 2033 by Carlene Coria, RN Outcome: Progressing   Problem: Elimination: Goal: Will not experience complications related to bowel motility 05/23/2023 0557 by Carlene Coria, RN Outcome: Progressing 05/22/2023 2033 by Carlene Coria, RN Outcome: Progressing Goal: Will not experience complications related to urinary retention 05/23/2023 0557 by Carlene Coria, RN Outcome: Progressing 05/22/2023 2033 by Carlene Coria, RN Outcome: Progressing   Problem: Pain Managment: Goal: General experience of comfort will improve and/or be controlled 05/23/2023 0557 by Carlene Coria, RN Outcome: Progressing 05/22/2023 2033 by Carlene Coria, RN Outcome: Progressing   Problem: Safety: Goal: Ability to remain free from injury will improve 05/23/2023 0557 by Carlene Coria, RN Outcome: Progressing 05/22/2023 2033 by Carlene Coria, RN Outcome: Progressing   Problem: Skin Integrity: Goal: Risk for impaired skin integrity will decrease 05/23/2023 0557 by Carlene Coria, RN Outcome: Progressing 05/22/2023 2033 by Carlene Coria, RN Outcome: Progressing   Problem: Education: Goal: Knowledge of disease or condition will improve 05/23/2023 0557 by Carlene Coria, RN Outcome: Progressing 05/22/2023 2033 by Carlene Coria, RN Outcome: Progressing Goal: Knowledge of secondary prevention will improve (MUST DOCUMENT ALL) 05/23/2023 0557 by Carlene Coria, RN Outcome: Progressing 05/22/2023 2033 by Carlene Coria, RN Outcome: Progressing Goal: Knowledge of patient specific risk factors will improve (DELETE if not current risk factor) 05/23/2023 0557 by Carlene Coria, RN Outcome: Progressing 05/22/2023 2033 by Carlene Coria, RN Outcome: Progressing   Problem: Intracerebral Hemorrhage Tissue Perfusion: Goal: Complications of Intracerebral Hemorrhage will be minimized 05/23/2023 0557 by Carlene Coria, RN Outcome: Progressing 05/22/2023 2033 by Carlene Coria, RN Outcome: Progressing   Problem: Coping: Goal: Will verbalize positive feelings about self 05/23/2023 0557 by Carlene Coria, RN Outcome: Progressing 05/22/2023 2033 by Carlene Coria, RN Outcome: Progressing Goal: Will identify appropriate support needs 05/23/2023  5784 by Carlene Coria, RN Outcome: Progressing 05/22/2023 2033 by Carlene Coria, RN Outcome: Progressing   Problem: Health Behavior/Discharge Planning: Goal: Ability to manage health-related needs will improve 05/23/2023 0557 by Carlene Coria, RN Outcome: Progressing 05/22/2023 2033 by Carlene Coria, RN Outcome: Progressing Goal: Goals will be collaboratively established with patient/family 05/23/2023 0557 by Carlene Coria, RN Outcome: Progressing 05/22/2023 2033 by Carlene Coria, RN Outcome: Progressing   Problem: Self-Care: Goal: Ability to participate in self-care as condition permits will improve 05/23/2023 0557 by Carlene Coria, RN Outcome: Progressing 05/22/2023 2033 by Carlene Coria, RN Outcome: Progressing Goal: Verbalization of feelings and concerns over difficulty with self-care will improve 05/23/2023 0557 by Carlene Coria, RN Outcome: Progressing 05/22/2023 2033 by Carlene Coria, RN Outcome: Progressing Goal: Ability to communicate needs accurately will improve 05/23/2023 0557 by Carlene Coria, RN Outcome: Progressing 05/22/2023 2033 by Carlene Coria, RN Outcome: Progressing   Problem: Nutrition: Goal: Risk of aspiration will decrease 05/23/2023 0557 by Carlene Coria, RN Outcome: Progressing 05/22/2023 2033 by Carlene Coria, RN Outcome: Progressing Goal: Dietary intake will improve 05/23/2023 0557 by Carlene Coria, RN Outcome: Progressing 05/22/2023 2033 by Carlene Coria, RN Outcome: Progressing   Problem: Education: Goal: Ability to describe self-care measures  that may prevent or decrease complications (Diabetes Survival Skills Education) will improve 05/23/2023 0557 by Carlene Coria, RN Outcome: Progressing 05/22/2023 2033 by Carlene Coria, RN Outcome: Progressing Goal: Individualized Educational Video(s) 05/23/2023 0557 by Carlene Coria, RN Outcome: Progressing 05/22/2023 2033 by Carlene Coria, RN Outcome: Progressing   Problem: Coping: Goal: Ability to adjust to condition or change in health will improve 05/23/2023 0557 by Carlene Coria, RN Outcome: Progressing 05/22/2023 2033 by Carlene Coria, RN Outcome: Progressing   Problem: Fluid Volume: Goal: Ability to maintain a balanced intake and output will improve 05/23/2023 0557 by Carlene Coria, RN Outcome: Progressing 05/22/2023 2033 by Carlene Coria, RN Outcome: Progressing   Problem: Health Behavior/Discharge Planning: Goal: Ability to identify and utilize available resources and services will improve 05/23/2023 0557 by Carlene Coria, RN Outcome: Progressing 05/22/2023 2033 by Carlene Coria, RN Outcome: Progressing Goal: Ability to manage health-related needs will improve 05/23/2023 0557 by Carlene Coria, RN Outcome: Progressing 05/22/2023 2033 by Carlene Coria, RN Outcome: Progressing   Problem: Metabolic: Goal: Ability to maintain appropriate glucose levels will improve 05/23/2023 0557 by Carlene Coria, RN Outcome: Progressing 05/22/2023 2033 by Carlene Coria, RN Outcome: Progressing   Problem: Nutritional: Goal: Maintenance of adequate nutrition will improve 05/23/2023 0557 by Carlene Coria, RN Outcome: Progressing 05/22/2023 2033 by Carlene Coria, RN Outcome: Progressing Goal: Progress toward achieving an optimal weight will improve 05/23/2023 0557 by Carlene Coria, RN Outcome: Progressing 05/22/2023 2033 by Carlene Coria, RN Outcome: Progressing

## 2023-05-24 DIAGNOSIS — R1310 Dysphagia, unspecified: Secondary | ICD-10-CM | POA: Diagnosis not present

## 2023-05-24 DIAGNOSIS — I6389 Other cerebral infarction: Secondary | ICD-10-CM | POA: Diagnosis not present

## 2023-05-24 DIAGNOSIS — Z515 Encounter for palliative care: Secondary | ICD-10-CM | POA: Diagnosis not present

## 2023-05-24 DIAGNOSIS — Z7189 Other specified counseling: Secondary | ICD-10-CM | POA: Diagnosis not present

## 2023-05-24 NOTE — Progress Notes (Signed)
   05/24/23 1308  Vitals  Temp 100.1 F (37.8 C)  Temp Source Oral  BP (!) 91/54  MAP (mmHg) 65  BP Location Right Arm  BP Method Automatic  Patient Position (if appropriate) Lying  Pulse Rate 98  ECG Heart Rate 98  Resp 20  MEWS COLOR  MEWS Score Color Green  Oxygen Therapy  SpO2 92 %  O2 Device Room Air  MEWS Score  MEWS Temp 0  MEWS Systolic 1  MEWS Pulse 0  MEWS RR 0  MEWS LOC 0  MEWS Score 1   Dr. David Stall aware of increased temp and soft BP. No new orders at this time.

## 2023-05-24 NOTE — Progress Notes (Signed)
 [facility] PROGRESS NOTE    Progress Note  Derek Yates  [name]:[id number] DOB: [birth year] DOA: 05/11/2023 PCP: [name], MD     Brief Narrative:   Derek Yates is an 83 y.o. male past medical history significant for essential hypertension, CVA recurrent UTI and dementia presented to [facility] with code stroke CT of the head showed intraparenchymal hemorrhage to the right caudate extending with intraventricular lesions, CCM consulted on 05/15/2023 for fevers for 48 hours, thought to be related to aspiration pneumonia transferred to CCM on 05/17/2023. 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 Palliative care on board patient is a DNR/DNI working with family as he has a poor prognosis. I believe he is having recurrent aspiration  Recurrent aspiration pneumonia: CT chest on 05/17/2023 showed lower lobes multifocal mucous plugging and patchy airspace infiltrate concerning for aspiration pneumonia Tmax this morning 100.5, had a mild leukocytosis yesterday. He is currently on cefepime and Flagyl. Palliative care board and helping trying to move towards comfort measures.  Acute respiratory failure with hypoxia due to aspiration pneumonia: Now been weaned to room air. Continue chest physiotherapy and inhalers.  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 Continue statins.  Acute kidney injury: Likely hemodynamic mediated resolved with IV fluid resuscitation.  Essential hypertension: Continue nifedipine.  Hyperlipidemia: Continue  statins.  Dysphagia: Currently n.p.o. with a core track. Speech to evaluate early this week  Advance dementia: Severe memory loss  Protein-calorie malnutrition, severe Noted.   DVT prophylaxis: SCD Family Communication:none Status is: Inpatient Remains inpatient appropriate because: Acute intracranial hemorrhage, okay to transfer to the floor    Code Status:     Code Status Orders  (From admission, onward)           Start     Ordered   05/17/23 1008  Do not attempt resuscitation (DNR)- Limited -Do Not Intubate (DNI)  (Code Status)  Continuous       Question Answer Comment  If pulseless and not breathing No CPR or chest compressions.   In Pre-Arrest Conditions (Patient Is Breathing and Has A Pulse) Do not intubate. Provide all appropriate non-invasive medical interventions. Avoid ICU transfer unless indicated or required.   Consent: Discussion documented in EHR or advanced directives reviewed      05/17/23 1008           Code Status History     Date Active Date Inactive Code Status Order ID Comments User Context   05/11/2023 2022 05/17/2023 1008 Full Code [id number]  [name], MD Inpatient         IV Access:   Peripheral IV   Procedures and diagnostic studies:   No results found.    Medical Consultants:   None.   Subjective:    [name] no complaints  Objective:    Vitals:   05/23/23 1942 05/23/23 2304 05/24/23 0311 05/24/23 0500  BP:  130/72 107/65   Pulse:      Resp:   20   Temp:   98.5 F (36.9 C)   TempSrc: Oral Axillary Oral   SpO2:  Weight:    63.5 kg   SpO2: 95 % O2 Flow Rate (L/min): 4 L/min   Intake/Output Summary (Last 24 hours) at 05/24/2023 0806 Last data filed at 05/24/2023 0600 Gross per 24 hour  Intake 1368.99 ml  Output 1050 ml  Net 318.99 ml   Filed Weights   05/20/23 0338 05/23/23 0500 05/24/23 0500  Weight: 63.5 kg 63.5 kg 63.5 kg    Exam: General exam: In no acute  distress. Respiratory system: Good air movement and clear to auscultation. Cardiovascular system: S1 & S2 heard, RRR. No JVD. Gastrointestinal system: Abdomen is nondistended, soft and nontender.  Extremities: No pedal edema. Skin: No rashes, lesions or ulcers  Data Reviewed:    Labs: Basic Metabolic Panel: Recent Labs  Lab 05/18/23 0834 05/19/23 0501 05/20/23 0537 05/23/23 0935  NA 143 141 143 138  K 4.6 4.6 4.0 3.9  CL 105 105 107 106  CO2 30 25 26 23   GLUCOSE 135* 131* 113* 178*  BUN 34* 39* 37* 33*  CREATININE 0.79 0.82 0.80 0.86  CALCIUM 9.5 8.9 8.9 8.9   GFR Estimated Creatinine Clearance: 56.4 mL/min (by C-G formula based on SCr of 0.86 mg/dL). Liver Function Tests: No results for input(s): "AST", "ALT", "ALKPHOS", "BILITOT", "PROT", "ALBUMIN" in the last 168 hours.  No results for input(s): "LIPASE", "AMYLASE" in the last 168 hours. No results for input(s): "AMMONIA" in the last 168 hours. Coagulation profile No results for input(s): "INR", "PROTIME" in the last 168 hours.  COVID-19 Labs  No results for input(s): "DDIMER", "FERRITIN", "LDH", "CRP" in the last 72 hours.  Lab Results  Component Value Date   SARSCOV2NAA Not Detected 09/14/2018    CBC: Recent Labs  Lab 05/18/23 0834 05/19/23 0501 05/20/23 0537 05/23/23 0935  WBC 12.4* 8.8 7.8 11.4*  NEUTROABS  --   --   --  8.1*  HGB 11.6* 10.2* 10.8* 11.7*  HCT 34.4* 29.7* 31.4* 34.8*  MCV 83.1 83.0 82.4 83.3  PLT 287 239 319 433*   Cardiac Enzymes: No results for input(s): "CKTOTAL", "CKMB", "CKMBINDEX", "TROPONINI" in the last 168 hours. BNP (last 3 results) No results for input(s): "PROBNP" in the last 8760 hours. CBG: Recent Labs  Lab 05/22/23 0005 05/22/23 0416 05/22/23 0720 05/22/23 1130 05/22/23 1523  GLUCAP 126* 134* 130* 114* 156*   D-Dimer: No results for input(s): "DDIMER" in the last 72 hours. Hgb A1c: No results for input(s): "HGBA1C" in the last 72 hours. Lipid  Profile: No results for input(s): "CHOL", "HDL", "LDLCALC", "TRIG", "CHOLHDL", "LDLDIRECT" in the last 72 hours. Thyroid function studies: No results for input(s): "TSH", "T4TOTAL", "T3FREE", "THYROIDAB" in the last 72 hours.  Invalid input(s): "FREET3" Anemia work up: No results for input(s): "VITAMINB12", "FOLATE", "FERRITIN", "TIBC", "IRON", "RETICCTPCT" in the last 72 hours. Sepsis Labs: Recent Labs  Lab 05/18/23 0834 05/19/23 0501 05/20/23 0537 05/23/23 0935  WBC 12.4* 8.8 7.8 11.4*   Microbiology Recent Results (from the past 240 hours)  Culture, blood (Routine X 2) w Reflex to ID Panel     Status: None   Collection Time: 05/14/23  5:12 PM   Specimen: BLOOD  Result Value Ref Range Status   Specimen Description BLOOD SITE NOT SPECIFIED  Final   Special Requests   Final    BOTTLES DRAWN AEROBIC AND ANAEROBIC Blood Culture adequate volume   Culture   Final    NO GROWTH 5 DAYS Performed at [facility], [address], [place], [place] [address]  Report Status 05/19/2023 FINAL  Final  Culture, blood (Routine X 2) w Reflex to ID Panel     Status: None   Collection Time: 05/14/23  5:20 PM   Specimen: BLOOD  Result Value Ref Range Status   Specimen Description BLOOD SITE NOT SPECIFIED  Final   Special Requests   Final    BOTTLES DRAWN AEROBIC AND ANAEROBIC Blood Culture adequate volume   Culture   Final    NO GROWTH 5 DAYS Performed at [facility], [address] [place], [place] [address]    Report Status 05/19/2023 FINAL  Final  Respiratory (~20 pathogens) panel by PCR     Status: None   Collection Time: 05/15/23 12:45 PM  Result Value Ref Range Status   Adenovirus NOT DETECTED NOT DETECTED Final   Coronavirus 229E NOT DETECTED NOT DETECTED Final    Comment: (NOTE) The Coronavirus on the Respiratory Panel, DOES NOT test for the novel  Coronavirus (2019 nCoV)    Coronavirus HKU1 NOT DETECTED NOT DETECTED Final   Coronavirus NL63 NOT DETECTED NOT  DETECTED Final   Coronavirus OC43 NOT DETECTED NOT DETECTED Final   Metapneumovirus NOT DETECTED NOT DETECTED Final   Rhinovirus / Enterovirus NOT DETECTED NOT DETECTED Final   Influenza A NOT DETECTED NOT DETECTED Final   Influenza B NOT DETECTED NOT DETECTED Final   Parainfluenza Virus 1 NOT DETECTED NOT DETECTED Final   Parainfluenza Virus 2 NOT DETECTED NOT DETECTED Final   Parainfluenza Virus 3 NOT DETECTED NOT DETECTED Final   Parainfluenza Virus 4 NOT DETECTED NOT DETECTED Final   Respiratory Syncytial Virus NOT DETECTED NOT DETECTED Final   Bordetella pertussis NOT DETECTED NOT DETECTED Final   Bordetella Parapertussis NOT DETECTED NOT DETECTED Final   Chlamydophila pneumoniae NOT DETECTED NOT DETECTED Final   Mycoplasma pneumoniae NOT DETECTED NOT DETECTED Final    Comment: Performed at [facility], [address], [place], [place] [address]  Expectorated Sputum Assessment w Gram Stain, Rflx to Resp Cult     Status: None   Collection Time: 05/17/23 10:42 AM   Specimen: Sputum  Result Value Ref Range Status   Specimen Description SPUTUM  Final   Special Requests NONE  Final   Sputum evaluation   Final    THIS SPECIMEN IS ACCEPTABLE FOR SPUTUM CULTURE Performed at [facility], [address], [place], [place] [address]    Report Status 05/17/2023 FINAL  Final  Culture, Respiratory w Gram Stain     Status: None   Collection Time: 05/17/23 10:42 AM   Specimen: SPU  Result Value Ref Range Status   Specimen Description SPUTUM  Final   Special Requests NONE Reflexed from [id number]  Final   Gram Stain   Final    MODERATE WBC PRESENT, PREDOMINANTLY PMN MODERATE GRAM NEGATIVE RODS MODERATE GRAM POSITIVE COCCI Performed at [facility], [address], [place], [place] [address]    Culture MODERATE STAPHYLOCOCCUS EPIDERMIDIS  Final   Report Status 05/20/2023 FINAL  Final   Organism ID, Bacteria STAPHYLOCOCCUS EPIDERMIDIS  Final      Susceptibility    Staphylococcus epidermidis - MIC*    CIPROFLOXACIN >=8 RESISTANT Resistant     ERYTHROMYCIN >=8 RESISTANT Resistant     GENTAMICIN <=0.5 SENSITIVE Sensitive     OXACILLIN >=4 RESISTANT Resistant     TETRACYCLINE >=16 RESISTANT Resistant     VANCOMYCIN 2 SENSITIVE Sensitive     TRIMETH/SULFA 160 RESISTANT Resistant  CLINDAMYCIN <=0.25 SENSITIVE Sensitive     RIFAMPIN <=0.5 SENSITIVE Sensitive     Inducible Clindamycin NEGATIVE Sensitive     * MODERATE STAPHYLOCOCCUS EPIDERMIDIS  MRSA Next Gen by PCR, Nasal     Status: None   Collection Time: 05/17/23 10:51 AM   Specimen: Nasal Mucosa; Nasal Swab  Result Value Ref Range Status   MRSA by PCR Next Gen NOT DETECTED NOT DETECTED Final    Comment: (NOTE) The GeneXpert MRSA Assay (FDA approved for NASAL specimens only), is one component of a comprehensive MRSA colonization surveillance program. It is not intended to diagnose MRSA infection nor to guide or monitor treatment for MRSA infections. Test performance is not FDA approved in patients less than 3 years old. Performed at [facility], [address], [place], [place] [address]   Gastrointestinal Panel by PCR , Stool     Status: None   Collection Time: 05/17/23 11:24 AM   Specimen: Stool  Result Value Ref Range Status   Campylobacter species NOT DETECTED NOT DETECTED Final   Plesimonas shigelloides NOT DETECTED NOT DETECTED Final   Salmonella species NOT DETECTED NOT DETECTED Final   Yersinia enterocolitica NOT DETECTED NOT DETECTED Final   Vibrio species NOT DETECTED NOT DETECTED Final   Vibrio cholerae NOT DETECTED NOT DETECTED Final   Enteroaggregative E coli (EAEC) NOT DETECTED NOT DETECTED Final   Enteropathogenic E coli (EPEC) NOT DETECTED NOT DETECTED Final   Enterotoxigenic E coli (ETEC) NOT DETECTED NOT DETECTED Final   Shiga like toxin producing E coli (STEC) NOT DETECTED NOT DETECTED Final   Shigella/Enteroinvasive E coli (EIEC) NOT DETECTED NOT DETECTED  Final   Cryptosporidium NOT DETECTED NOT DETECTED Final   Cyclospora cayetanensis NOT DETECTED NOT DETECTED Final   Entamoeba histolytica NOT DETECTED NOT DETECTED Final   Giardia lamblia NOT DETECTED NOT DETECTED Final   Adenovirus F40/41 NOT DETECTED NOT DETECTED Final   Astrovirus NOT DETECTED NOT DETECTED Final   Norovirus GI/GII NOT DETECTED NOT DETECTED Final   Rotavirus A NOT DETECTED NOT DETECTED Final   Sapovirus (I, II, IV, and V) NOT DETECTED NOT DETECTED Final    Comment: Performed at [facility], [address] [place], [place] [address]  Culture, blood (Routine X 2) w Reflex to ID Panel     Status: None   Collection Time: 05/17/23  4:04 PM   Specimen: BLOOD  Result Value Ref Range Status   Specimen Description BLOOD SITE NOT SPECIFIED  Final   Special Requests   Final    BOTTLES DRAWN AEROBIC AND ANAEROBIC Blood Culture results may not be optimal due to an inadequate volume of blood received in culture bottles   Culture   Final    NO GROWTH 5 DAYS Performed at [facility], [address], [place], [place] [address]    Report Status 05/22/2023 FINAL  Final  Culture, blood (Routine X 2) w Reflex to ID Panel     Status: None   Collection Time: 05/17/23  4:04 PM   Specimen: BLOOD  Result Value Ref Range Status   Specimen Description BLOOD SITE NOT SPECIFIED  Final   Special Requests   Final    BOTTLES DRAWN AEROBIC AND ANAEROBIC Blood Culture results may not be optimal due to an inadequate volume of blood received in culture bottles   Culture   Final    NO GROWTH 5 DAYS Performed at [facility], [address], [place], [place]  [address]    Report Status 05/22/2023 FINAL  Final  Culture, OB Urine     Status: None   Collection Time: 05/17/23  4:14 PM   Specimen: Urine, Random  Result Value Ref Range Status   Specimen Description URINE, RANDOM  Final   Special Requests NONE  Final   Culture   Final    NO GROWTH NO GROUP B STREP  (S.AGALACTIAE) ISOLATED Performed at [facility], [address], [place], [place] [address]    Report Status 05/18/2023 FINAL  Final     Medications:    amLODipine  10 mg Per Tube Daily   feeding supplement (PROSource TF20)  60 mL Per Tube BID   fiber supplement (BANATROL TF)  60 mL Per Tube BID   guaiFENesin  15 mL Per Tube Q6H   heparin injection (subcutaneous)  5,000 Units Subcutaneous Q12H   multivitamin with minerals  1 tablet Per Tube Daily   mouth rinse  15 mL Mouth Rinse 4 times per day   pantoprazole (PROTONIX) IV  40 mg Intravenous QHS   Continuous Infusions:  ceFEPime (MAXIPIME) IV 2 g (05/24/23 0527)   feeding supplement (OSMOLITE 1.5 CAL) 40 mL/hr at 05/23/23 1315   metronidazole 500 mg (05/23/23 2353)      LOS: 13 days   Marinda Elk  [facility]  05/24/2023, 8:06 AM

## 2023-05-24 NOTE — Progress Notes (Signed)
 Occupational Therapy Treatment Patient Details Name: Derek Yates MRN: [id number] DOB: 1940-08-20 Today's Date: 05/24/2023   History of present illness 83 yo male presents to [facility] on 2/19 as code stroke for speech deficits, L weakness. 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 PMH includes dementia, recurrent UTIs, CVA, HTN.   OT comments  Patient with increased lethargy this session.  Patient was able to open his eyes a few times and did make eye contact.  Some verbalizations with family, but otherwise difficult to keep awake.  Sat EOB with initial total assist gradually to CGS for a minute or two.  [name] rectal foley leaking, and +2 for hygiene and new gown.  OT will continue efforts in the acute setting to address deficits, and Patient will benefit from continued inpatient follow up therapy, <3 hours/day.        If plan is discharge home, recommend the following:  Assist for transportation;A lot of help with bathing/dressing/bathroom;Two people to help with walking and/or transfers;Assistance with feeding   Equipment Recommendations  None recommended by OT    Recommendations for Other Services      Precautions / Restrictions Precautions Precautions: Fall Recall of Precautions/Restrictions: Impaired Precaution/Restrictions Comments: NG Feeding tube, rectal tube Restrictions Weight Bearing Restrictions Per Provider Order: No       Mobility Bed Mobility Overal bed mobility: Needs Assistance Bed Mobility: Sidelying to Sit, Sit to Sidelying Rolling: Total assist Sidelying to sit: Total assist     Sit to sidelying: Total assist      Transfers                         Balance Overall balance assessment: Needs assistance Sitting-balance support: Feet supported, Bilateral upper extremity supported Sitting balance-Leahy Scale: Poor Sitting balance - Comments: initially leaning to the left.  hands placed in lap and with time CGA for a  minute Postural control: Posterior lean, Left lateral lean                                 ADL either performed or assessed with clinical judgement   ADL   Eating/Feeding: NPO   Grooming: Total assistance;Sitting   Upper Body Bathing: Total assistance;Bed level   Lower Body Bathing: Total assistance;Bed level   Upper Body Dressing : Total assistance;Sitting   Lower Body Dressing: Total assistance;Bed level       Toileting- Clothing Manipulation and Hygiene: Total assistance;Bed level              Extremity/Trunk Assessment Upper Extremity Assessment RUE Deficits / Details: very rigid and continues to resist movement, good strength LUE Deficits / Details: very rigid and continues to resist movement.  LUE postures more toward flexion, but he is able to break out with verbal commands. LUE Coordination: decreased fine motor;decreased gross motor   Lower Extremity Assessment Lower Extremity Assessment: Defer to PT evaluation   Cervical / Trunk Assessment Cervical / Trunk Assessment: Kyphotic    Vision   Vision Assessment?: Vision impaired- to be further tested in functional context   Perception Perception Perception: Not tested   Praxis Praxis Praxis: Not tested   Communication Communication Communication: Impaired Factors Affecting Communication: Difficulty expressing self;Reduced clarity of speech   Cognition Arousal: Lethargic Behavior During Therapy: Flat affect Cognition: Difficult to assess Difficult to assess due to: Level of arousal  Following commands: Impaired Following commands impaired: Follows one step commands inconsistently, Follows one step commands with increased time      Cueing   Cueing Techniques: Verbal cues, Tactile cues  Exercises      Shoulder Instructions       General Comments      Pertinent Vitals/ Pain       Pain Assessment Pain Assessment: Faces Faces Pain Scale:  Hurts little more Pain Location: generalized with movement Pain Descriptors / Indicators: Grimacing Pain Intervention(s): Monitored during session                                                          Frequency  Min 1X/week        Progress Toward Goals  OT Goals(current goals can now be found in the care plan section)  Progress towards OT goals: Not progressing toward goals - comment (increased lethargy this session.  Temp 100.1)     Plan      Co-evaluation                 AM-PAC OT "6 Clicks" Daily Activity     Outcome Measure   Help from another person eating meals?: Total Help from another person taking care of personal grooming?: Total Help from another person toileting, which includes using toliet, bedpan, or urinal?: Total Help from another person bathing (including washing, rinsing, drying)?: Total Help from another person to put on and taking off regular upper body clothing?: Total Help from another person to put on and taking off regular lower body clothing?: Total 6 Click Score: 6    End of Session    OT Visit Diagnosis: Unsteadiness on feet (R26.81);Other symptoms and signs involving cognitive function;Muscle weakness (generalized) (M62.81)   Activity Tolerance Patient limited by lethargy   Patient Left in bed;with call bell/phone within reach;with bed alarm set;with family/visitor present   Nurse Communication Mobility status        Time: 9629-5284 OT Time Calculation (min): 25 min  Charges: OT General Charges $OT Visit: 1 Visit OT Treatments $Self Care/Home Management : 8-22 mins $Therapeutic Activity: 8-22 mins  05/24/2023  RP, OTR/L  Acute Rehabilitation Services  Office:  [phone]   Suzanna Obey 05/24/2023, 3:10 PM

## 2023-05-24 NOTE — Progress Notes (Signed)
 Palliative:  ***  Yong Channel, NP Palliative Medicine Team Pager [phone] (Please see [email or web address] for schedule) Team Phone [phone]

## 2023-05-24 NOTE — Progress Notes (Signed)
 Speech Language Pathology Treatment: Dysphagia  Patient Details Name: Derek Yates MRN: [id number] DOB: 31-Aug-1940 Today's Date: 05/24/2023 Time: 4132-4401 SLP Time Calculation (min) (ACUTE ONLY): 22 min  Assessment / Plan / Recommendation Clinical Impression  Pt started this session alert, but became more lethargic as session continued. His son shared that he is more lethargic than he has been the last two days. Despite decreased alertness, there is still some mild improvements noted in clinical swallowing presentation. He had a strong cough response to the first spoonful of water, with no hyoid movement to palpation, suggesting that the did not trigger a swallow. With SLP giving verbal prompts to swallow, he had a more consistent swallow response across all additional spoonfuls of water administered with no further coughing and no change in vocal quality. Given reduced s/s concerning for aspiration with spoonfuls, SLP assisted pt with self-feeding to attempt a cup sip, but this elicited a strong cough response as well. Discussed timing of MBS with pt and son. Son would prefer to wait at least an additional day given that he is less alert overall this morning. SLP will continue to follow and proceed with MBS as appropriate.    HPI HPI: Patient is an 83 y.o. male with PMH: dementia, recurrent UTI's, CVA, HTN. He presented to the hospital on 05/11/23 as a code stroke with speech deficits and left sided weakness. 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 Patient has been NPO; Cortrak placed 2/21. He failed [facility] swallow screen with RN.      SLP Plan  Continue with current plan of care      Recommendations for follow up therapy are one component of a multi-disciplinary discharge planning process, led by the attending physician.  Recommendations may be updated based on patient  status, additional functional criteria and insurance authorization.    Recommendations  Diet recommendations: NPO;Other(comment) (piece of ice or small spoonful of water after oral care) Medication Administration: Via alternative means                  Oral care QID;Oral care prior to ice chip/H20   Frequent or constant Supervision/Assistance Dysphagia, unspecified (R13.10)     Continue with current plan of care     Derek Yates., M.A. CCC-SLP Acute Rehabilitation Services Office [phone]  Secure chat preferred   05/24/2023, 10:40 AM

## 2023-05-25 DIAGNOSIS — I6389 Other cerebral infarction: Secondary | ICD-10-CM | POA: Diagnosis not present

## 2023-05-25 LAB — GLUCOSE, CAPILLARY: Glucose-Capillary: 147 mg/dL — ABNORMAL HIGH (ref 70–99)

## 2023-05-25 NOTE — Progress Notes (Signed)
 Physical Therapy Treatment Patient Details Name: Derek Yates MRN: [id number] DOB: [birth year] Today's Date: 05/25/2023   History of Present Illness 83 yo male presents to [facility] on 2/19 as code stroke for speech deficits, L weakness. 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 PMH includes dementia, recurrent UTIs, CVA, HTN.    PT Comments  Upon entry, patient laying in supine with wife at bedside. Patient with increased lethargy and inconsistent command follow. Difficult to arouse, however patient able to communicate intermittently and express his personality with humor. Requires total A for bed mobility and transfer, sat EOB with external support to maintain posture and limit posterior/left lateral lean. Deferred transfer to chair using the stedy due to SPO2 desaturating to 78%, donned [place] and O2 rebounded to 97% on 2L. Transitioned patient with totalA +2 back to supine.  PT will continue to follow up to address deficits, and patient will benefit from continued inpatient follow up therapy, <3 hours/day.    If plan is discharge home, recommend the following: Two people to help with walking and/or transfers;Two people to help with bathing/dressing/bathroom;A lot of help with walking and/or transfers;Assistance with cooking/housework;Assistance with feeding;Assist for transportation;Help with stairs or ramp for entrance   Can travel by private vehicle     No  Equipment Recommendations  Wheelchair (measurements PT);Wheelchair cushion (measurements PT);Hospital bed;Hoyer lift    Recommendations for Other Services Rehab consult     Precautions / Restrictions Precautions Precautions: Fall Recall of Precautions/Restrictions: Impaired Precaution/Restrictions Comments: NG tube Restrictions Weight Bearing Restrictions Per Provider Order: No     Mobility  Bed Mobility Overal bed mobility: Needs Assistance Bed Mobility: Sidelying to Sit, Sit to Supine   Sidelying to  sit: Total assist, +2 for physical assistance Supine to sit: Total assist, HOB elevated, +2 for physical assistance Sit to supine: Total assist, +2 for physical assistance   General bed mobility comments: TotalA to elevate trunk and move legs EOB, strong lean to L when transitioning to a seated position    Transfers                   General transfer comment: Deferred due to O2 desaturating to 78%, donned [place] and O2 rebonded to 97% after transitioning patient back to supine    Ambulation/Gait                   Stairs             Wheelchair Mobility     Tilt Bed    Modified Rankin (Stroke Patients Only)       Balance Overall balance assessment: Needs assistance Sitting-balance support: Feet supported, Bilateral upper extremity supported Sitting balance-Leahy Scale: Poor Sitting balance - Comments: Initially leaning to the left. External support to maintain upright posture Postural control: Posterior lean, Left lateral lean                                  Communication Communication Communication: Impaired Factors Affecting Communication: Difficulty expressing self;Reduced clarity of speech  Cognition Arousal: Lethargic Behavior During Therapy: Flat affect   PT - Cognitive impairments: Attention, Initiation                       PT - Cognition Comments: pt opening eyes with increased cues, decreased awareness of position, posture, deficits. increased processing time Following commands: Impaired Following commands  impaired: Follows one step commands inconsistently, Follows one step commands with increased time    Cueing Cueing Techniques: Verbal cues, Tactile cues  Exercises General Exercises - Lower Extremity Long Arc Quad: AROM, Both, 5 reps, Seated    General Comments        Pertinent Vitals/Pain Pain Assessment Pain Assessment: Faces Faces Pain Scale: Hurts little more Pain Location: generalized with  movement Pain Descriptors / Indicators: Grimacing Pain Intervention(s): Limited activity within patient's tolerance, Monitored during session, Repositioned    Home Living                          Prior Function            PT Goals (current goals can now be found in the care plan section) Acute Rehab PT Goals Patient Stated Goal: based on subjective intake during initial eval, son wants his dad to be more alert PT Goal Formulation: With patient/family Time For Goal Achievement: 05/27/23 Potential to Achieve Goals: Fair Progress towards PT goals: Not progressing toward goals - comment    Frequency    Min 1X/week      PT Plan      Co-evaluation              AM-PAC PT "6 Clicks" Mobility   Outcome Measure  Help needed turning from your back to your side while in a flat bed without using bedrails?: Total Help needed moving from lying on your back to sitting on the side of a flat bed without using bedrails?: Total Help needed moving to and from a bed to a chair (including a wheelchair)?: Total Help needed standing up from a chair using your arms (e.g., wheelchair or bedside chair)?: Total Help needed to walk in hospital room?: Total Help needed climbing 3-5 steps with a railing? : Total 6 Click Score: 6    End of Session Equipment Utilized During Treatment: Gait belt;Oxygen Activity Tolerance: Treatment limited secondary to medical complications (Comment);Other (comment) (O2 Desaturated to 78% in seated position) Patient left: in bed;with bed alarm set;with call bell/phone within reach;with family/visitor present Nurse Communication: Mobility status;Other (comment) (O2) PT Visit Diagnosis: Other abnormalities of gait and mobility (R26.89);Muscle weakness (generalized) (M62.81)     Time: 1107-1140 PT Time Calculation (min) (ACUTE ONLY): 33 min  Charges:    $Therapeutic Activity: 23-37 mins PT General Charges $$ ACUTE PT VISIT: 1 Visit                      Doreen Beam, SPT   Dezaria Methot 05/25/2023, 1:48 PM

## 2023-05-25 NOTE — Progress Notes (Signed)
 [facility] PROGRESS NOTE    Progress Note  Derek Yates  ZOX:[id number] DOB: 09-09-1940 DOA: 05/11/2023 PCP: [name], MD     Brief Narrative:   Derek Yates is an 83 y.o. male past medical history significant for essential hypertension, CVA recurrent UTI and dementia presented to [facility] with code stroke CT of the head showed intraparenchymal hemorrhage to the right caudate extending with intraventricular lesions, CCM consulted on 05/15/2023 for fevers for 48 hours, thought to be related to aspiration pneumonia transferred to CCM on 05/17/2023. 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 Physical therapy evaluated the patient and recommended inpatient rehab Is not a candidate for antithrombotic therapy due to ICH CAA Still with tube feedings per coreTrack, family still undecided to place PEG tube which is probably not a good idea as he is having recurrent episodes of aspiration with a core track. Speech evaluated the patient, will keep the patient NPO. Palliative care on board patient is a DNR/DNI working with family as he has a poor prognosis. I believe he is having recurrent aspiration  Recurrent aspiration pneumonia: CT chest on 05/17/2023 showed lower lobes multifocal mucous plugging and patchy airspace infiltrate concerning for aspiration pneumonia. Tmax this morning 100.1, had a mild leukocytosis yesterday. Now back and cefepime and Flagyl. Palliative care board and helping trying to move towards comfort measures. Son and wife having a hard time letting go.  Acute respiratory failure with hypoxia due to aspiration pneumonia: Now been weaned to room air. Continue chest physiotherapy and inhalers.  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 Continue statins.  Acute kidney injury: Likely  hemodynamic mediated resolved with IV fluid resuscitation.  Essential hypertension: Continue nifedipine.  Hyperlipidemia: Continue statins.  Dysphagia: Currently n.p.o. with a core track. Speech to evaluate early this week  Advance dementia: Severe memory loss  Protein-calorie malnutrition, severe Noted.   DVT prophylaxis: SCD Family Communication:none Status is: Inpatient Remains inpatient appropriate because: Acute intracranial hemorrhage, okay to transfer to the floor    Code Status:     Code Status Orders  (From admission, onward)           Start     Ordered   05/17/23 1008  Do not attempt resuscitation (DNR)- Limited -Do Not Intubate (DNI)  (Code Status)  Continuous       Question Answer Comment  If pulseless and not breathing No CPR or chest compressions.   In Pre-Arrest Conditions (Patient Is Breathing and Has A Pulse) Do not intubate. Provide all appropriate non-invasive medical interventions. Avoid ICU transfer unless indicated or required.   Consent: Discussion documented in EHR or advanced directives reviewed      05/17/23 1008           Code Status History     Date Active Date Inactive Code Status Order ID Comments User Context   05/11/2023 2022 05/17/2023 1008 Full Code [id number]  [name], MD Inpatient         IV Access:   Peripheral IV   Procedures and diagnostic studies:   No results found.    Medical Consultants:   None.   Subjective:    Derek Yates no complaints  Objective:    Vitals:   05/24/23 2353 05/25/23 0000 05/25/23 0319 05/25/23 0741  BP:  (!) 103/56 113/64   Pulse:  88 89 (!) 105  Resp:  (!) 26 (!) 25 20  Temp: 99.4 F (37.4 C)  98.5 F (36.9 C)   TempSrc: Axillary  Axillary   SpO2:  93% 95% 97%  Weight:   64.7 kg    SpO2: 97 % O2 Flow Rate (L/min): 4 L/min   Intake/Output Summary (Last 24 hours) at 05/25/2023 0744 Last data filed at 05/24/2023 2300 Gross per 24 hour  Intake  --  Output 1200 ml  Net -1200 ml   Filed Weights   05/23/23 0500 05/24/23 0500 05/25/23 0319  Weight: 63.5 kg 63.5 kg 64.7 kg    Exam: General exam: In no acute distress. Respiratory system: Good air movement and clear to auscultation. Cardiovascular system: S1 & S2 heard, RRR. No JVD. Gastrointestinal system: Abdomen is nondistended, soft and nontender.  Extremities: No pedal edema. Skin: No rashes, lesions or ulcers  Data Reviewed:    Labs: Basic Metabolic Panel: Recent Labs  Lab 05/18/23 0834 05/19/23 0501 05/20/23 0537 05/23/23 0935  NA 143 141 143 138  K 4.6 4.6 4.0 3.9  CL 105 105 107 106  CO2 30 25 26 23   GLUCOSE 135* 131* 113* 178*  BUN 34* 39* 37* 33*  CREATININE 0.79 0.82 0.80 0.86  CALCIUM 9.5 8.9 8.9 8.9   GFR Estimated Creatinine Clearance: 57.5 mL/min (by C-G formula based on SCr of 0.86 mg/dL). Liver Function Tests: No results for input(s): "AST", "ALT", "ALKPHOS", "BILITOT", "PROT", "ALBUMIN" in the last 168 hours.  No results for input(s): "LIPASE", "AMYLASE" in the last 168 hours. No results for input(s): "AMMONIA" in the last 168 hours. Coagulation profile No results for input(s): "INR", "PROTIME" in the last 168 hours.  COVID-19 Labs  No results for input(s): "DDIMER", "FERRITIN", "LDH", "CRP" in the last 72 hours.  Lab Results  Component Value Date   SARSCOV2NAA Not Detected 09/14/2018    CBC: Recent Labs  Lab 05/18/23 0834 05/19/23 0501 05/20/23 0537 05/23/23 0935  WBC 12.4* 8.8 7.8 11.4*  NEUTROABS  --   --   --  8.1*  HGB 11.6* 10.2* 10.8* 11.7*  HCT 34.4* 29.7* 31.4* 34.8*  MCV 83.1 83.0 82.4 83.3  PLT 287 239 319 433*   Cardiac Enzymes: No results for input(s): "CKTOTAL", "CKMB", "CKMBINDEX", "TROPONINI" in the last 168 hours. BNP (last 3 results) No results for input(s): "PROBNP" in the last 8760 hours. CBG: Recent Labs  Lab 05/22/23 0416 05/22/23 0720 05/22/23 1130 05/22/23 1523 05/25/23 0319  GLUCAP 134*  130* 114* 156* 147*   D-Dimer: No results for input(s): "DDIMER" in the last 72 hours. Hgb A1c: No results for input(s): "HGBA1C" in the last 72 hours. Lipid Profile: No results for input(s): "CHOL", "HDL", "LDLCALC", "TRIG", "CHOLHDL", "LDLDIRECT" in the last 72 hours. Thyroid function studies: No results for input(s): "TSH", "T4TOTAL", "T3FREE", "THYROIDAB" in the last 72 hours.  Invalid input(s): "FREET3" Anemia work up: No results for input(s): "VITAMINB12", "FOLATE", "FERRITIN", "TIBC", "IRON", "RETICCTPCT" in the last 72 hours. Sepsis Labs: Recent Labs  Lab 05/18/23 0834 05/19/23 0501 05/20/23 0537 05/23/23 0935  WBC 12.4* 8.8 7.8 11.4*   Microbiology Recent Results (from the past 240 hours)  Respiratory (~20 pathogens) panel by PCR     Status: None   Collection Time: 05/15/23 12:45 PM  Result Value Ref Range Status   Adenovirus NOT DETECTED NOT DETECTED Final   Coronavirus 229E NOT DETECTED NOT DETECTED Final    Comment: (NOTE) The Coronavirus on the Respiratory Panel, DOES  NOT test for the novel  Coronavirus (2019 nCoV)    Coronavirus HKU1 NOT DETECTED NOT DETECTED Final   Coronavirus NL63 NOT DETECTED NOT DETECTED Final   Coronavirus OC43 NOT DETECTED NOT DETECTED Final   Metapneumovirus NOT DETECTED NOT DETECTED Final   Rhinovirus / Enterovirus NOT DETECTED NOT DETECTED Final   Influenza A NOT DETECTED NOT DETECTED Final   Influenza B NOT DETECTED NOT DETECTED Final   Parainfluenza Virus 1 NOT DETECTED NOT DETECTED Final   Parainfluenza Virus 2 NOT DETECTED NOT DETECTED Final   Parainfluenza Virus 3 NOT DETECTED NOT DETECTED Final   Parainfluenza Virus 4 NOT DETECTED NOT DETECTED Final   Respiratory Syncytial Virus NOT DETECTED NOT DETECTED Final   Bordetella pertussis NOT DETECTED NOT DETECTED Final   Bordetella Parapertussis NOT DETECTED NOT DETECTED Final   Chlamydophila pneumoniae NOT DETECTED NOT DETECTED Final   Mycoplasma pneumoniae NOT DETECTED NOT  DETECTED Final    Comment: Performed at [facility], [address] [place], [place] [address]  Expectorated Sputum Assessment w Gram Stain, Rflx to Resp Cult     Status: None   Collection Time: 05/17/23 10:42 AM   Specimen: Sputum  Result Value Ref Range Status   Specimen Description SPUTUM  Final   Special Requests NONE  Final   Sputum evaluation   Final    THIS SPECIMEN IS ACCEPTABLE FOR SPUTUM CULTURE Performed at [facility], [address], [place], [place] [address]    Report Status 05/17/2023 FINAL  Final  Culture, Respiratory w Gram Stain     Status: None   Collection Time: 05/17/23 10:42 AM   Specimen: SPU  Result Value Ref Range Status   Specimen Description SPUTUM  Final   Special Requests NONE Reflexed from [id number]  Final   Gram Stain   Final    MODERATE WBC PRESENT, PREDOMINANTLY PMN MODERATE GRAM NEGATIVE RODS MODERATE GRAM POSITIVE COCCI Performed at [facility], [address], [place], [place] [address]    Culture MODERATE STAPHYLOCOCCUS EPIDERMIDIS  Final   Report Status 05/20/2023 FINAL  Final   Organism ID, Bacteria STAPHYLOCOCCUS EPIDERMIDIS  Final      Susceptibility   Staphylococcus epidermidis - MIC*    CIPROFLOXACIN >=8 RESISTANT Resistant     ERYTHROMYCIN >=8 RESISTANT Resistant     GENTAMICIN <=0.5 SENSITIVE Sensitive     OXACILLIN >=4 RESISTANT Resistant     TETRACYCLINE >=16 RESISTANT Resistant     VANCOMYCIN 2 SENSITIVE Sensitive     TRIMETH/SULFA 160 RESISTANT Resistant     CLINDAMYCIN <=0.25 SENSITIVE Sensitive     RIFAMPIN <=0.5 SENSITIVE Sensitive     Inducible Clindamycin NEGATIVE Sensitive     * MODERATE STAPHYLOCOCCUS EPIDERMIDIS  MRSA Next Gen by PCR, Nasal     Status: None   Collection Time: 05/17/23 10:51 AM   Specimen: Nasal Mucosa; Nasal Swab  Result Value Ref Range Status   MRSA by PCR Next Gen NOT DETECTED NOT DETECTED Final    Comment: (NOTE) The GeneXpert MRSA Assay (FDA approved for NASAL specimens  only), is one component of a comprehensive MRSA colonization surveillance program. It is not intended to diagnose MRSA infection nor to guide or monitor treatment for MRSA infections. Test performance is not FDA approved in patients less than 25 years old. Performed at [facility], [address] [place], [place] [address]   Gastrointestinal Panel by PCR , Stool     Status: None  Collection Time: 05/17/23 11:24 AM   Specimen: Stool  Result Value Ref Range Status   Campylobacter species NOT DETECTED NOT DETECTED Final   Plesimonas shigelloides NOT DETECTED NOT DETECTED Final   Salmonella species NOT DETECTED NOT DETECTED Final   Yersinia enterocolitica NOT DETECTED NOT DETECTED Final   Vibrio species NOT DETECTED NOT DETECTED Final   Vibrio cholerae NOT DETECTED NOT DETECTED Final   Enteroaggregative E coli (EAEC) NOT DETECTED NOT DETECTED Final   Enteropathogenic E coli (EPEC) NOT DETECTED NOT DETECTED Final   Enterotoxigenic E coli (ETEC) NOT DETECTED NOT DETECTED Final   Shiga like toxin producing E coli (STEC) NOT DETECTED NOT DETECTED Final   Shigella/Enteroinvasive E coli (EIEC) NOT DETECTED NOT DETECTED Final   Cryptosporidium NOT DETECTED NOT DETECTED Final   Cyclospora cayetanensis NOT DETECTED NOT DETECTED Final   Entamoeba histolytica NOT DETECTED NOT DETECTED Final   Giardia lamblia NOT DETECTED NOT DETECTED Final   Adenovirus F40/41 NOT DETECTED NOT DETECTED Final   Astrovirus NOT DETECTED NOT DETECTED Final   Norovirus GI/GII NOT DETECTED NOT DETECTED Final   Rotavirus A NOT DETECTED NOT DETECTED Final   Sapovirus (I, II, IV, and V) NOT DETECTED NOT DETECTED Final    Comment: Performed at [facility], [address] [place], [place] [address]  Culture, blood (Routine X 2) w Reflex to ID Panel     Status: None   Collection Time: 05/17/23  4:04 PM   Specimen: BLOOD  Result Value Ref Range Status   Specimen Description BLOOD SITE NOT SPECIFIED   Final   Special Requests   Final    BOTTLES DRAWN AEROBIC AND ANAEROBIC Blood Culture results may not be optimal due to an inadequate volume of blood received in culture bottles   Culture   Final    NO GROWTH 5 DAYS Performed at [facility], [address] [place], [place] [address]    Report Status 05/22/2023 FINAL  Final  Culture, blood (Routine X 2) w Reflex to ID Panel     Status: None   Collection Time: 05/17/23  4:04 PM   Specimen: BLOOD  Result Value Ref Range Status   Specimen Description BLOOD SITE NOT SPECIFIED  Final   Special Requests   Final    BOTTLES DRAWN AEROBIC AND ANAEROBIC Blood Culture results may not be optimal due to an inadequate volume of blood received in culture bottles   Culture   Final    NO GROWTH 5 DAYS Performed at [facility], [address], [place], [place] [address]    Report Status 05/22/2023 FINAL  Final  Culture, OB Urine     Status: None   Collection Time: 05/17/23  4:14 PM   Specimen: Urine, Random  Result Value Ref Range Status   Specimen Description URINE, RANDOM  Final   Special Requests NONE  Final   Culture   Final    NO GROWTH NO GROUP B STREP (S.AGALACTIAE) ISOLATED Performed at [facility], [address], [place], [place] [address]    Report Status 05/18/2023 FINAL  Final     Medications:    amLODipine  10 mg Per Tube Daily   feeding supplement (PROSource TF20)  60 mL Per Tube BID   fiber supplement (BANATROL TF)  60 mL Per Tube BID   guaiFENesin  15 mL Per Tube Q6H   heparin injection (subcutaneous)  5,000 Units Subcutaneous Q12H   multivitamin with minerals  1 tablet Per Tube Daily   mouth rinse  15 mL Mouth Rinse 4 times per day   pantoprazole (PROTONIX) IV  40 mg Intravenous QHS   Continuous Infusions:  ceFEPime (MAXIPIME) IV 2 g (05/25/23 0518)   feeding supplement (OSMOLITE 1.5 CAL) 40 mL/hr at 05/23/23 1315   metronidazole 500 mg (05/24/23 2324)      LOS: 14 days   Marinda Elk  [facility]  05/25/2023, 7:44 AM

## 2023-05-25 NOTE — Progress Notes (Signed)
 SLP Cancellation Note  Patient Details Name: Derek Yates MRN: [id number] DOB: 12/19/1940   Cancelled treatment:       Reason Eval/Treat Not Completed: Patient not medically ready;Fatigue/lethargy limiting ability to participate. SLP touched base with RN this morning to see if pt was appropriate for MBS today. Pt's level of alertness not supportive of this. Will f/u on subsequent date.     [name]., M.A. CCC-SLP Acute Rehabilitation Services Office [phone]  Secure chat preferred  05/25/2023, 8:48 AM

## 2023-05-26 DIAGNOSIS — I6389 Other cerebral infarction: Secondary | ICD-10-CM | POA: Diagnosis not present

## 2023-05-26 DIAGNOSIS — I610 Nontraumatic intracerebral hemorrhage in hemisphere, subcortical: Secondary | ICD-10-CM | POA: Diagnosis not present

## 2023-05-26 DIAGNOSIS — F03A0 Unspecified dementia, mild, without behavioral disturbance, psychotic disturbance, mood disturbance, and anxiety: Secondary | ICD-10-CM | POA: Diagnosis not present

## 2023-05-26 DIAGNOSIS — Z515 Encounter for palliative care: Secondary | ICD-10-CM | POA: Diagnosis not present

## 2023-05-26 DIAGNOSIS — Z7189 Other specified counseling: Secondary | ICD-10-CM | POA: Diagnosis not present

## 2023-05-26 NOTE — Progress Notes (Signed)
 Palliative:  HPI: 83 y.o. 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 Hospitalization complicated by aspiration pneumonia as well as possible neurogenic fever.    I reviewed records. I spoke with RN as well as SLP. Discussed with SLP risks vs benefits of MBS. I met today at Derek Yates's bedside. His son, brother, and a friend are at bedside. Son reports that there will be other family coming in later today. He continues to speak with family. No decisions today. I informed him that I will return on Monday and my colleagues are available in the meantime - just to call the number on my card to reach them.   All questions/concerns addressed. Emotional support provided.   Exam: Eyes open and waves. No distress. Breathing regular, unlabored. Generalized weakness and fatigue.   Plan:  - DNR/DNI - Ongoing discussions amongst family for path forward  25 min  Yong Channel, NP Palliative Medicine Team Pager [phone] (Please see [email or web address] for schedule) Team Phone [phone]

## 2023-05-26 NOTE — Hospital Course (Signed)
 83 y.o. male past medical history significant for essential hypertension, CVA recurrent UTI and dementia presented to [facility] with code stroke CT of the head showed intraparenchymal hemorrhage to the right caudate extending with intraventricular lesions, CCM consulted on 05/15/2023 for fevers for 48 hours, thought to be related to aspiration pneumonia transferred to CCM on 05/17/2023.

## 2023-05-26 NOTE — TOC Progression Note (Signed)
 Transition of Care [facility]) - Progression Note    Patient Details  Name: Derek Yates MRN: [id number] Date of Birth: 1940/11/16  Transition of Care [facility] CM/SW Contact  Lorri Frederick, LCSW Phone Number: 05/26/2023, 8:28 AM  Clinical Narrative:   Pt continues with coretrack, SNF referral has not been sent out.  TOC continues to follow.     Expected Discharge Plan: Skilled Nursing Facility Barriers to Discharge: Continued Medical Work up, SNF Pending bed offer, English as a second language teacher  Expected Discharge Plan and Services   Discharge Planning Services: CM Consult Post Acute Care Choice: Skilled Nursing Facility Living arrangements for the past 2 months: Single Family Home                                       Social Determinants of Health (SDOH) Interventions SDOH Screenings   Food Insecurity: Patient Unable To Answer (05/13/2023)  Housing: Patient Unable To Answer (05/13/2023)  Transportation Needs: Patient Unable To Answer (05/13/2023)  Utilities: Patient Unable To Answer (05/13/2023)  Depression (PHQ2-9): Medium Risk (02/10/2023)  Financial Resource Strain: Low Risk  (06/20/2021)   Received from [facility], [facility]  Social Connections: Patient Unable To Answer (05/13/2023)  Tobacco Use: Low Risk  (05/04/2023)    Readmission Risk Interventions     No data to display

## 2023-05-26 NOTE — Progress Notes (Signed)
 Speech Language Pathology Treatment: Dysphagia  Patient Details Name: Derek Yates MRN: [id number] DOB: Mar 17, 1941 Today's Date: 05/26/2023 Time: 1324-4010 SLP Time Calculation (min) (ACUTE ONLY): 18 min  Assessment / Plan / Recommendation Clinical Impression  Derek Yates was sleepy today. Oral care was provided. With verbal cues, he would open his eyes, answer basic questions, and he accepted a few 1/2 tspns of lukewarm water.  He required max cues to seal lips.  He did demonstrate laryngeal movement to palpation - it appeared to occur after delay, upon which there was immediate coughing. He was a bit resistant to oral suctioning, but was able to use his RUE to self-suction with some physical assist.  He continues to present with overt s/s of aspiration. D/W his son the benefit of pursuing MBS when his father is more consistently alert. Unfortunately, recovery of swallow may be hampered by chronic infarcts in multiple lobes in the left hemisphere. MBS will help ascertain overall function and help with decision-making.  Son, Derek Yates, agrees with plan.  Continue NPO with occasional sips of water after oral care.     HPI HPI: Patient is an 82 y.o. male with PMH: dementia, recurrent UTI's, CVA, HTN. He presented to the hospital on 05/11/23 as a code stroke with speech deficits and left sided weakness. 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 Patient has been NPO; Cortrak placed 2/21. He failed [facility] swallow screen with RN.      SLP Plan  Continue with current plan of care      Recommendations for follow up therapy are one component of a multi-disciplinary discharge planning process, led by the attending physician.  Recommendations may be updated based on patient status, additional functional criteria and insurance authorization.    Recommendations  Diet recommendations:  NPO Medication Administration: Via alternative means                  Oral care QID;Oral care prior to ice chip/H20   Frequent or constant Supervision/Assistance Dysphagia, unspecified (R13.10)     Continue with current plan of care   Julia Alkhatib L. Samson Frederic, MA CCC/SLP Clinical Specialist - Acute Care SLP Acute Rehabilitation Services Office number [phone]   Blenda Mounts [place]  05/26/2023, 11:25 AM

## 2023-05-26 NOTE — Plan of Care (Signed)
  Problem: Clinical Measurements: Goal: Will remain free from infection Outcome: Progressing   Problem: Activity: Goal: Risk for activity intolerance will decrease Outcome: Progressing   Problem: Nutrition: Goal: Adequate nutrition will be maintained Outcome: Progressing   Problem: Skin Integrity: Goal: Risk for impaired skin integrity will decrease Outcome: Progressing   

## 2023-05-26 NOTE — Progress Notes (Signed)
  Progress Note   Patient: Derek Yates OZH:[id number] DOB: 1940-05-08 DOA: 05/11/2023     15 DOS: the patient was seen and examined on 05/26/2023   Brief hospital course: 83 y.o. male past medical history significant for essential hypertension, CVA recurrent UTI and dementia presented to [facility] with code stroke CT of the head showed intraparenchymal hemorrhage to the right caudate extending with intraventricular lesions, CCM consulted on 05/15/2023 for fevers for 48 hours, thought to be related to aspiration pneumonia transferred to CCM on 05/17/2023.   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 Physical therapy evaluated the patient and recommended inpatient rehab Is not a candidate for antithrombotic therapy due to ICH CAA Pt continues with tube feed per coreTrack, family remains undecided to place PEG tube. Son at bedside aware that PEG does not prevent aspiration. Speech evaluated the patient, will keep the patient NPO. Palliative care on board patient is a DNR/DNI working with family as he has a poor prognosis. Concerns for continued aspiration PNA   Recurrent aspiration pneumonia: CT chest on 05/17/2023 showed lower lobes multifocal mucous plugging and patchy airspace infiltrate concerning for aspiration pneumonia Later with fevers, pt was continued on cefepime and Flagyl on 3/2 Palliative care board to help address goals of care   Acute respiratory failure with hypoxia due to aspiration pneumonia: Now been weaned to minimal O2 Continue chest physiotherapy and inhalers.   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 Continue statins.   Acute kidney injury: Likely hemodynamic mediated resolved with IV fluid resuscitation.   Essential hypertension: Continue nifedipine.   Hyperlipidemia: Continue statins.    Dysphagia: Currently n.p.o. with a core track. Speech to evaluate early this week   Advance dementia: Severe memory loss   Protein-calorie malnutrition, severe Noted.      Subjective: Difficult to assess given deficits  Physical Exam: Vitals:   05/26/23 0334 05/26/23 0805 05/26/23 0834 05/26/23 1134  BP: 119/70 93/62  110/64  Pulse: 88 91  86  Resp: (!) 21 (!) 21 19 20   Temp: 98.9 F (37.2 C) 98.7 F (37.1 C)  99.4 F (37.4 C)  TempSrc: Axillary Oral  Axillary  SpO2: 98% 97%  96%  Weight: 62.4 kg      General exam: Awake, laying in bed, in nad Respiratory system: Normal respiratory effort, no wheezing Cardiovascular system: regular rate, s1, s2 Gastrointestinal system: Soft, nondistended, positive BS Central nervous system: CN2-12 grossly intact, strength intact Extremities: Perfused, no clubbing Skin: Normal skin turgor, no notable skin lesions seen Psychiatry: Mood normal // no visual hallucinations   Data Reviewed:  Labs reviewed: Na 138, K 3.9, Cr 0.86, WBC 11.4, Hgb 11.7  Family Communication: Pt in room, family at bedside  Disposition: Status is: Inpatient Remains inpatient appropriate because: Severity of illness  Planned Discharge Destination:  Unclear at this time    Author: Rickey Barbara, MD 05/26/2023 3:49 PM  For on call review [email or web address].

## 2023-05-27 ENCOUNTER — Inpatient Hospital Stay (HOSPITAL_COMMUNITY)

## 2023-05-27 DIAGNOSIS — I610 Nontraumatic intracerebral hemorrhage in hemisphere, subcortical: Secondary | ICD-10-CM | POA: Diagnosis not present

## 2023-05-27 DIAGNOSIS — F03A0 Unspecified dementia, mild, without behavioral disturbance, psychotic disturbance, mood disturbance, and anxiety: Secondary | ICD-10-CM | POA: Diagnosis not present

## 2023-05-27 LAB — CBC
HCT: 32.1 % — ABNORMAL LOW (ref 39.0–52.0)
Hemoglobin: 11.1 g/dL — ABNORMAL LOW (ref 13.0–17.0)
MCH: 28.7 pg (ref 26.0–34.0)
MCHC: 34.6 g/dL (ref 30.0–36.0)
MCV: 82.9 fL (ref 80.0–100.0)
Platelets: 462 10*3/uL — ABNORMAL HIGH (ref 150–400)
RBC: 3.87 MIL/uL — ABNORMAL LOW (ref 4.22–5.81)
RDW: 16 % — ABNORMAL HIGH (ref 11.5–15.5)
WBC: 8 10*3/uL (ref 4.0–10.5)
nRBC: 0 % (ref 0.0–0.2)

## 2023-05-27 LAB — COMPREHENSIVE METABOLIC PANEL
ALT: 20 U/L (ref 0–44)
AST: 22 U/L (ref 15–41)
Albumin: 2.6 g/dL — ABNORMAL LOW (ref 3.5–5.0)
Alkaline Phosphatase: 54 U/L (ref 38–126)
Anion gap: 6 (ref 5–15)
BUN: 31 mg/dL — ABNORMAL HIGH (ref 8–23)
CO2: 25 mmol/L (ref 22–32)
Calcium: 8.6 mg/dL — ABNORMAL LOW (ref 8.9–10.3)
Chloride: 105 mmol/L (ref 98–111)
Creatinine, Ser: 0.69 mg/dL (ref 0.61–1.24)
GFR, Estimated: >60 mL/min (ref >60)
Glucose, Bld: 144 mg/dL — ABNORMAL HIGH (ref 70–99)
Potassium: 4.2 mmol/L (ref 3.5–5.1)
Sodium: 136 mmol/L (ref 135–145)
Total Bilirubin: 0.3 mg/dL (ref 0.0–1.2)
Total Protein: 6.5 g/dL (ref 6.5–8.1)

## 2023-05-27 MED ORDER — GLYCOPYRROLATE 1 MG PO TABS
1.0000 mg | ORAL_TABLET | Freq: Two times a day (BID) | ORAL | Status: DC | PRN
  Administered 2023-05-27 – 2023-06-03 (×2): 1 mg
  Filled 2023-05-27 (×3): qty 1

## 2023-05-27 NOTE — Plan of Care (Signed)
  Problem: Clinical Measurements: Goal: Diagnostic test results will improve Outcome: Progressing   Problem: Clinical Measurements: Goal: Respiratory complications will improve Outcome: Progressing   Problem: Nutrition: Goal: Adequate nutrition will be maintained Outcome: Progressing

## 2023-05-27 NOTE — Progress Notes (Signed)
 SLP Cancellation Note  Patient Details Name: Derek Yates MRN: [id number] DOB: [birth year]   Cancelled treatment:       Reason Eval/Treat Not Completed: Fatigue/lethargy limiting ability to participate. MBS was scheduled for this afternoon with radiology. Per nursing, pt more somnolent this afternoon and not alert enough to attempt. Will defer for today. Nursing also shares that family reports better alertness normally in the mornings, so will try again earlier in the day, as can be scheduled with radiology.    [name]., M.A. CCC-SLP Acute Rehabilitation Services Office [phone]  Secure chat preferred  05/27/2023, 1:16 PM

## 2023-05-27 NOTE — Progress Notes (Signed)
 Nutrition Follow-up  DOCUMENTATION CODES:  Severe malnutrition in context of chronic illness  INTERVENTION:  Continue tube feeding via Cortrak tube: Osmolite 1.5 at 40 ml/h (960 ml per day) Prosource TF20 60 ml BID   Provides 1600 kcal, 100 gm protein, 729 ml free water daily   MVI with minerals daily via tube  Banatrol BID, each packet provides 5g soluble fiber Monitor for PEG tube placement and adjust to bolus feeds, if indicated Monitor SLP notes for diet advancement   NUTRITION DIAGNOSIS:  Severe Malnutrition related to chronic illness as evidenced by severe fat depletion, severe muscle depletion. 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  2/19 admitted w/ R caudate bleed 2/21 - s/p cortrak placement; tip gastric  2/22 - bedside swallow: NPO, CT head, CXR 2/25 - CT of chest: PNA 2/26 - transfer to 4NP 3/7 - unable to complete MBSS   Pt mentation not improving. Too somnolent to complete MBSS today and, therefore, remains NPO. Continuing to assess and will attempt repeat of MBSS at a later time, specifically in the morning when patient is reported to be more alert.  Family undecided about PEG placement and awaiting MBSS results to make decision. Continued concern for aspiration on tube feedings. Could consider post-pyloric placement, however this does not negate patient risk of aspiration. Nor does PEG placement. Palliative care engaged. Discharge plan unclear at this time.   TF running at goal rate. Pt with no N/V/C/D. Documented with one BM today. Banatrol still at 2x daily. No family at bedside during visit.    Admit Weight: 61.9kg Current  Weight: 62.4kg Lowest Weight: 58.8kg on 2/25   Wt stable. No edema. 1.5L UOP yesterday. AKI resolved with fluid resuscitation. Skin remains intact.     Meds: MVI, pantoprazole, IV ABX    Labs:  Na 137 (wdl) CBGs 144 x1 draw in 4 days A1c 5.7 (04/2023)  Diet Order:   Diet Order             Diet NPO time specified Except for: Other (See Comments)  Diet effective now            EDUCATION NEEDS:  Not appropriate for education at this time  Skin:  Skin Assessment: Reviewed RN Assessment  Last BM:  3/7  Height:  Ht Readings from Last 1 Encounters:  05/11/23 5\' 8"  (1.727 m)   Weight:  Wt Readings from Last 1 Encounters:  05/27/23 62.4 kg    Ideal Body Weight:  70 kg  BMI:  Body mass index is 20.92 kg/m.  Estimated Nutritional Needs:   Kcal:  1600-1800  Protein:  90-100 grams  Fluid:  >1.6 L/day  Myrtie Cruise MS, RD, LDN Registered Dietitian Clinical Nutrition RD Inpatient Contact Info in Amion

## 2023-05-27 NOTE — Progress Notes (Signed)
  Progress Note   Patient: Derek Yates BJY:[id number] DOB: [birth year] DOA: 05/11/2023     16 DOS: the patient was seen and examined on 05/27/2023   Brief hospital course: 83 y.o. male past medical history significant for essential hypertension, CVA recurrent UTI and dementia presented to [facility] with code stroke CT of the head showed intraparenchymal hemorrhage to the right caudate extending with intraventricular lesions, CCM consulted on 05/15/2023 for fevers for 48 hours, thought to be related to aspiration pneumonia transferred to CCM on 05/17/2023.   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 Physical therapy evaluated the patient and recommended inpatient rehab Is not a candidate for antithrombotic therapy due to ICH CAA Pt continues with tube feed per coreTrack, family remains undecided to place PEG tube. Son at bedside aware that PEG does not prevent aspiration. Speech evaluated the patient, will keep the patient NPO. Palliative care on board patient is a DNR/DNI working with family as he has a poor prognosis. Concerns for continued aspiration PNA Coarse breath sounds today. Ordered PRN robinul   Recurrent aspiration pneumonia: CT chest on 05/17/2023 showed lower lobes multifocal mucous plugging and patchy airspace infiltrate concerning for aspiration pneumonia Later with fevers, pt was continued on cefepime and Flagyl on 3/2 Palliative care is now on board to assist with goals of care   Acute respiratory failure with hypoxia due to aspiration pneumonia: Now been weaned to minimal O2 Continue chest physiotherapy and inhalers.   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 Continue statins.   Acute kidney injury: Likely hemodynamic mediated resolved with IV fluid resuscitation.   Essential hypertension: Continue  nifedipine.   Hyperlipidemia: Continue statins.   Dysphagia: Currently n.p.o. with a core track. Speech to evaluate early this week   Advance dementia: Severe memory loss   Protein-calorie malnutrition, severe Noted.      Subjective: unable to assess given deficits  Physical Exam: Vitals:   05/27/23 0707 05/27/23 0824 05/27/23 1200 05/27/23 1440  BP:  123/71 (!) 127/59 104/67  Pulse:  98 (!) 113 85  Resp:  (!) 24 19 20   Temp:  97.9 F (36.6 C) 98 F (36.7 C) 98.1 F (36.7 C)  TempSrc:  Oral Oral Oral  SpO2:  95% 96% 96%  Weight: 62.4 kg      General exam: Conversant, in no acute distress Respiratory system: normal chest rise, clear, no audible wheezing Cardiovascular system: regular rhythm, s1-s2 Gastrointestinal system: Nondistended, nontender, pos BS Central nervous system: No seizures, no tremors Extremities: No cyanosis, no joint deformities Skin: No rashes, no pallor Psychiatry: difficult to assess given deficits   Data Reviewed:  Labs reviewed: Na 136, K 4.2, Cr 0.69, WBC 8.0, Hgb 11.1  Family Communication: Pt in room, family currently not at bedside  Disposition: Status is: Inpatient Remains inpatient appropriate because: Severity of illness  Planned Discharge Destination:  Unclear at this time    Author: Rickey Barbara, MD 05/27/2023 4:36 PM  For on call review [email or web address].

## 2023-05-28 DIAGNOSIS — F03A0 Unspecified dementia, mild, without behavioral disturbance, psychotic disturbance, mood disturbance, and anxiety: Secondary | ICD-10-CM | POA: Diagnosis not present

## 2023-05-28 DIAGNOSIS — I610 Nontraumatic intracerebral hemorrhage in hemisphere, subcortical: Secondary | ICD-10-CM | POA: Diagnosis not present

## 2023-05-28 NOTE — Progress Notes (Signed)
  Progress Note   Patient: Derek Yates ZHY:[id number] DOB: Jan 20, 1941 DOA: 05/11/2023     17 DOS: the patient was seen and examined on 05/28/2023   Brief hospital course: 83 y.o. male past medical history significant for essential hypertension, CVA recurrent UTI and dementia presented to [facility] with code stroke CT of the head showed intraparenchymal hemorrhage to the right caudate extending with intraventricular lesions, CCM consulted on 05/15/2023 for fevers for 48 hours, thought to be related to aspiration pneumonia transferred to CCM on 05/17/2023.   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 Physical therapy evaluated the patient and recommended inpatient rehab Is not a candidate for antithrombotic therapy due to ICH CAA Pt continues with tube feed per coreTrack, family remains undecided to place PEG tube. Speech evaluated the patient, will keep the patient NPO. Palliative care on board patient is a DNR/DNI working with family as he has a poor prognosis. Concerns for continued aspiration PNA, continued on abx as tolerated Continues with breath sounds. Would continue suctioning as needed. Will continue PRN robinul as needed   Recurrent aspiration pneumonia: CT chest on 05/17/2023 showed lower lobes multifocal mucous plugging and patchy airspace infiltrate concerning for aspiration pneumonia Later with fevers, pt was continued on cefepime and Flagyl on 3/2 Palliative care is now on board to assist with goals of care   Acute respiratory failure with hypoxia due to aspiration pneumonia: Now been weaned to minimal O2 Continue chest physiotherapy and inhalers.   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 Continue statins.   Acute kidney injury: Likely hemodynamic mediated resolved with IV fluid resuscitation.    Essential hypertension: Continue nifedipine.   Hyperlipidemia: Continue statins.   Dysphagia: Currently n.p.o. with a core track. Speech to evaluate early this week   Advance dementia: Severe memory loss   Protein-calorie malnutrition, severe Noted.      Subjective: Difficult to assess given deficits. Family reports increased coarse breath sounds noted  Physical Exam: Vitals:   05/28/23 0317 05/28/23 0500 05/28/23 0803 05/28/23 1137  BP:      Pulse: (!) 108     Resp: 18     Temp: 99 F (37.2 C)     TempSrc: Axillary  Axillary Axillary  SpO2: 92%     Weight:  62.9 kg     General exam: Awake, laying in bed, in nad Respiratory system: Normal respiratory effort, no wheezing Cardiovascular system: regular rate, s1, s2 Gastrointestinal system: Soft, nondistended, positive BS Central nervous system: CN2-12 grossly intact, strength intact Extremities: Perfused, no clubbing Skin: Normal skin turgor, no notable skin lesions seen Psychiatry: Mood normal // no visual hallucinations   Data Reviewed:  There are no new results to review at this time.  Family Communication: Pt in room, family currently not at bedside  Disposition: Status is: Inpatient Remains inpatient appropriate because: Severity of illness  Planned Discharge Destination:  Unclear at this time    Author: Rickey Barbara, MD 05/28/2023 1:28 PM  For on call review [email or web address].

## 2023-05-28 NOTE — Plan of Care (Signed)
  Problem: Clinical Measurements: Goal: Ability to maintain clinical measurements within normal limits will improve Outcome: Progressing Goal: Will remain free from infection Outcome: Progressing Goal: Diagnostic test results will improve Outcome: Progressing Goal: Cardiovascular complication will be avoided Outcome: Progressing   Problem: Nutrition: Goal: Adequate nutrition will be maintained Outcome: Progressing   Problem: Coping: Goal: Level of anxiety will decrease Outcome: Progressing   Problem: Elimination: Goal: Will not experience complications related to bowel motility Outcome: Progressing Goal: Will not experience complications related to urinary retention Outcome: Progressing   Problem: Intracerebral Hemorrhage Tissue Perfusion: Goal: Complications of Intracerebral Hemorrhage will be minimized Outcome: Progressing   Problem: Health Behavior/Discharge Planning: Goal: Goals will be collaboratively established with patient/family Outcome: Progressing   Problem: Nutrition: Goal: Dietary intake will improve Outcome: Progressing   Problem: Fluid Volume: Goal: Ability to maintain a balanced intake and output will improve Outcome: Progressing   Problem: Metabolic: Goal: Ability to maintain appropriate glucose levels will improve Outcome: Progressing   Problem: Tissue Perfusion: Goal: Adequacy of tissue perfusion will improve Outcome: Progressing

## 2023-05-28 NOTE — Progress Notes (Signed)
 Called by RN to assess pt for CPT. Pt asleep in NAD at this time w/ RA sats of 95%. Multiple family members at bedside who request that pt not be wakened. Green acapella device at bedside. Will coordinate w/ Jillyn Hidden, RN to have pt use acapella when awake. BBS coarse and dec'd at bases.

## 2023-05-29 DIAGNOSIS — I610 Nontraumatic intracerebral hemorrhage in hemisphere, subcortical: Secondary | ICD-10-CM | POA: Diagnosis not present

## 2023-05-29 DIAGNOSIS — Z7189 Other specified counseling: Secondary | ICD-10-CM | POA: Diagnosis not present

## 2023-05-29 DIAGNOSIS — F03A0 Unspecified dementia, mild, without behavioral disturbance, psychotic disturbance, mood disturbance, and anxiety: Secondary | ICD-10-CM | POA: Diagnosis not present

## 2023-05-29 DIAGNOSIS — Z515 Encounter for palliative care: Secondary | ICD-10-CM | POA: Diagnosis not present

## 2023-05-29 LAB — CBC
HCT: 32.5 % — ABNORMAL LOW (ref 39.0–52.0)
Hemoglobin: 11.2 g/dL — ABNORMAL LOW (ref 13.0–17.0)
MCH: 28.6 pg (ref 26.0–34.0)
MCHC: 34.5 g/dL (ref 30.0–36.0)
MCV: 82.9 fL (ref 80.0–100.0)
Platelets: 443 10*3/uL — ABNORMAL HIGH (ref 150–400)
RBC: 3.92 MIL/uL — ABNORMAL LOW (ref 4.22–5.81)
RDW: 16.6 % — ABNORMAL HIGH (ref 11.5–15.5)
WBC: 10.2 10*3/uL (ref 4.0–10.5)
nRBC: 0 % (ref 0.0–0.2)

## 2023-05-29 LAB — COMPREHENSIVE METABOLIC PANEL
ALT: 29 U/L (ref 0–44)
AST: 29 U/L (ref 15–41)
Albumin: 2.5 g/dL — ABNORMAL LOW (ref 3.5–5.0)
Alkaline Phosphatase: 51 U/L (ref 38–126)
Anion gap: 8 (ref 5–15)
BUN: 35 mg/dL — ABNORMAL HIGH (ref 8–23)
CO2: 24 mmol/L (ref 22–32)
Calcium: 8.9 mg/dL (ref 8.9–10.3)
Chloride: 104 mmol/L (ref 98–111)
Creatinine, Ser: 0.79 mg/dL (ref 0.61–1.24)
GFR, Estimated: >60 mL/min (ref >60)
Glucose, Bld: 173 mg/dL — ABNORMAL HIGH (ref 70–99)
Potassium: 4.5 mmol/L (ref 3.5–5.1)
Sodium: 136 mmol/L (ref 135–145)
Total Bilirubin: 0.6 mg/dL (ref 0.0–1.2)
Total Protein: 6.6 g/dL (ref 6.5–8.1)

## 2023-05-29 LAB — GLUCOSE, CAPILLARY
Glucose-Capillary: 132 mg/dL — ABNORMAL HIGH (ref 70–99)
Glucose-Capillary: 121 mg/dL — ABNORMAL HIGH (ref 70–99)

## 2023-05-29 MED ORDER — FREE WATER
200.0000 mL | Status: DC
  Administered 2023-05-29 – 2023-06-18 (×116): 200 mL

## 2023-05-29 NOTE — Progress Notes (Signed)
 Daily Progress Note   Patient Name: Derek Yates       Date: 05/29/2023 DOB: 1941/02/20  Age: 83 y.o. MRN#: [id number] Attending Physician: Jerald Kief, MD Primary Care Physician: [name], MD Admit Date: 05/11/2023  Reason for Consultation/Follow-up: Establishing goals of care  Length of Stay: 18  Current Medications: Scheduled Meds:   amLODipine  10 mg Per Tube Daily   feeding supplement (PROSource TF20)  60 mL Per Tube BID   fiber supplement (BANATROL TF)  60 mL Per Tube BID   free water  200 mL Per Tube Q4H   guaiFENesin  15 mL Per Tube Q6H   heparin injection (subcutaneous)  5,000 Units Subcutaneous Q12H   multivitamin with minerals  1 tablet Per Tube Daily   mouth rinse  15 mL Mouth Rinse 4 times per day   pantoprazole (PROTONIX) IV  40 mg Intravenous QHS    Continuous Infusions:  ceFEPime (MAXIPIME) IV 2 g (05/29/23 0524)   feeding supplement (OSMOLITE 1.5 CAL) 1,000 mL (05/29/23 0810)   metronidazole 500 mg (05/29/23 1020)    PRN Meds: acetaminophen **OR** acetaminophen (TYLENOL) oral liquid 160 mg/5 mL **OR** acetaminophen, glycopyrrolate, labetalol, mouth rinse  Physical Exam Vitals reviewed.  Constitutional:      General: He is sleeping. He is not in acute distress.    Appearance: He is ill-appearing.     Comments: cortrak  Cardiovascular:     Rate and Rhythm: Tachycardia present.  Pulmonary:     Effort: Pulmonary effort is normal.  Skin:    General: Skin is warm and dry.             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 Hospitalization complicated by aspiration pneumonia as well as possible neurogenic fever.   Today's Discussion: Updates received from bedside RN and Dr. Rhona Leavens. Patient is sleeping in NAD. He does not wake when I enter the room. Wife is at bedside.  1015: Spoke to patient's son Producer, television/film/video by phone. Beckey Downing shares that he and the family have discussed the option of having a PEG tube placed. They have decided that a PEG tube would not align with the patient's values and wishes. We discussed that without the PEG tube it is reasonable to consider transitioning to a comfort based path-- ensuring the patient has dignity and comfort at his end of life. Beckey Downing understands this but he also wants to give the patient his best chance of improving. We discuss setting a timeline for improvement.   Beckey Downing has seen a decline in the patient's alertness and function over the last few days and would like to talk to Dr. Rhona Leavens about cortrak, medications, and plan moving  forward. Encourage Riaz to call PMT with questions or concerns.  Recommendations/Plan: DNR/DNI Family does not want PEG tube Continue treating the treatable Discussed setting time limitation on cortrak PMT will continue to support    Code Status:    Code Status Orders  (From admission, onward)           Start     Ordered   05/17/23 1008  Do not attempt resuscitation (DNR)- Limited -Do Not Intubate (DNI)  (Code Status)  Continuous       Question Answer Comment  If pulseless and not breathing No CPR or chest compressions.   In Pre-Arrest Conditions (Patient Is Breathing and Has A Pulse) Do not intubate. Provide all appropriate non-invasive medical interventions. Avoid ICU transfer unless indicated or required.   Consent: Discussion documented in EHR or advanced directives reviewed      05/17/23 1008         Extensive chart review has been completed prior to seeing the patient including labs, vital signs, imaging, progress/consult notes, orders, medications, and available advance directive documents.  Care plan was discussed with bedside RN and Dr. Rhona Leavens  Time spent: 50 minutes  Thank you for allowing the Palliative Medicine Team to assist in the care of this patient.     Sherryll Burger, NP  Please contact Palliative Medicine Team phone at [phone] for questions and concerns.

## 2023-05-29 NOTE — Progress Notes (Signed)
  Progress Note   Patient: Derek Yates VZD:[id number] DOB: August 28, 1940 DOA: 05/11/2023     18 DOS: the patient was seen and examined on 05/29/2023   Brief hospital course: 83 y.o. male past medical history significant for essential hypertension, CVA recurrent UTI and dementia presented to [facility] with code stroke CT of the head showed intraparenchymal hemorrhage to the right caudate extending with intraventricular lesions, CCM consulted on 05/15/2023 for fevers for 48 hours, thought to be related to aspiration pneumonia transferred to CCM on 05/17/2023.   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 Physical therapy evaluated the patient and recommended inpatient rehab Is not a candidate for antithrombotic therapy due to ICH CAA Pt continues with tube feed per coreTrack, family remains undecided to place PEG tube. Speech evaluated the patient, will keep the patient NPO. Palliative care on board patient is a DNR/DNI working with family as he has a poor prognosis. Concerns for continued aspiration PNA, continued on abx as tolerated Continues with breath sounds. Would continue suctioning as needed. Agree with chest PT and robinul as needed   Recurrent aspiration pneumonia: CT chest on 05/17/2023 showed lower lobes multifocal mucous plugging and patchy airspace infiltrate concerning for aspiration pneumonia Later with fevers, pt was continued on cefepime and Flagyl on 3/2 Palliative care is now on board to assist with goals of care   Acute respiratory failure with hypoxia due to aspiration pneumonia: Now been weaned to minimal O2 Continue chest physiotherapy and inhalers.   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 Continue statins.   Acute kidney injury: Likely hemodynamic mediated resolved with IV fluid  resuscitation. Mildly tachycardic and pt appears dry on exam. Start 200cc free water flush per tube q4h Recheck bmet in AM   Essential hypertension: Continue nifedipine.   Hyperlipidemia: Continue statins.   Dysphagia: Currently n.p.o. with a core track. Speech to evaluate early this week   Advance dementia: Severe memory loss   Protein-calorie malnutrition, severe Noted.      Subjective: Cannot assess given deficits  Physical Exam: Vitals:   05/28/23 2331 05/29/23 0326 05/29/23 0728 05/29/23 1122  BP: 99/64 113/63    Pulse: 100 (!) 105    Resp: 19 20    Temp: 98 F (36.7 C) 98 F (36.7 C) 99.3 F (37.4 C)   TempSrc: Axillary Axillary Axillary Axillary  SpO2: 93% 98%    Weight:       General exam: Conversant, in no acute distress Respiratory system: normal chest rise, clear, no audible wheezing Cardiovascular system: regular rhythm, s1-s2 Gastrointestinal system: Nondistended, nontender, pos BS Central nervous system: No seizures, no tremors Extremities: No cyanosis, no joint deformities Skin: No rashes, no pallor Psychiatry: Affect normal // no auditory hallucinations   Data Reviewed:  Labs reviewed: Na 136, K 4.5, Cr 0.79, WBC 10.2  Family Communication: Pt in room, family currently not at bedside  Disposition: Status is: Inpatient Remains inpatient appropriate because: Severity of illness  Planned Discharge Destination:  Unclear at this time    Author: Rickey Barbara, MD 05/29/2023 1:51 PM  For on call review [email or web address].

## 2023-05-30 ENCOUNTER — Inpatient Hospital Stay (HOSPITAL_COMMUNITY)

## 2023-05-30 DIAGNOSIS — Z7189 Other specified counseling: Secondary | ICD-10-CM | POA: Diagnosis not present

## 2023-05-30 DIAGNOSIS — Z515 Encounter for palliative care: Secondary | ICD-10-CM | POA: Diagnosis not present

## 2023-05-30 DIAGNOSIS — R1310 Dysphagia, unspecified: Secondary | ICD-10-CM | POA: Diagnosis not present

## 2023-05-30 DIAGNOSIS — I6389 Other cerebral infarction: Secondary | ICD-10-CM | POA: Diagnosis not present

## 2023-05-30 DIAGNOSIS — I610 Nontraumatic intracerebral hemorrhage in hemisphere, subcortical: Secondary | ICD-10-CM | POA: Diagnosis not present

## 2023-05-30 DIAGNOSIS — F03A0 Unspecified dementia, mild, without behavioral disturbance, psychotic disturbance, mood disturbance, and anxiety: Secondary | ICD-10-CM | POA: Diagnosis not present

## 2023-05-30 LAB — COMPREHENSIVE METABOLIC PANEL
ALT: 24 U/L (ref 0–44)
AST: 22 U/L (ref 15–41)
Albumin: 2.3 g/dL — ABNORMAL LOW (ref 3.5–5.0)
Alkaline Phosphatase: 50 U/L (ref 38–126)
Anion gap: 7 (ref 5–15)
BUN: 34 mg/dL — ABNORMAL HIGH (ref 8–23)
CO2: 24 mmol/L (ref 22–32)
Calcium: 8.8 mg/dL — ABNORMAL LOW (ref 8.9–10.3)
Chloride: 106 mmol/L (ref 98–111)
Creatinine, Ser: 0.71 mg/dL (ref 0.61–1.24)
GFR, Estimated: >60 mL/min (ref >60)
Glucose, Bld: 132 mg/dL — ABNORMAL HIGH (ref 70–99)
Potassium: 4.4 mmol/L (ref 3.5–5.1)
Sodium: 137 mmol/L (ref 135–145)
Total Bilirubin: 0.5 mg/dL (ref 0.0–1.2)
Total Protein: 6.3 g/dL — ABNORMAL LOW (ref 6.5–8.1)

## 2023-05-30 LAB — CBC
HCT: 30.1 % — ABNORMAL LOW (ref 39.0–52.0)
Hemoglobin: 10.3 g/dL — ABNORMAL LOW (ref 13.0–17.0)
MCH: 28.8 pg (ref 26.0–34.0)
MCHC: 34.2 g/dL (ref 30.0–36.0)
MCV: 84.1 fL (ref 80.0–100.0)
Platelets: 381 10*3/uL (ref 150–400)
RBC: 3.58 MIL/uL — ABNORMAL LOW (ref 4.22–5.81)
RDW: 16.5 % — ABNORMAL HIGH (ref 11.5–15.5)
WBC: 8.6 10*3/uL (ref 4.0–10.5)
nRBC: 0 % (ref 0.0–0.2)

## 2023-05-30 LAB — GLUCOSE, CAPILLARY
Glucose-Capillary: 140 mg/dL — ABNORMAL HIGH (ref 70–99)
Glucose-Capillary: 131 mg/dL — ABNORMAL HIGH (ref 70–99)
Glucose-Capillary: 119 mg/dL — ABNORMAL HIGH (ref 70–99)
Glucose-Capillary: 136 mg/dL — ABNORMAL HIGH (ref 70–99)
Glucose-Capillary: 114 mg/dL — ABNORMAL HIGH (ref 70–99)
Glucose-Capillary: 126 mg/dL — ABNORMAL HIGH (ref 70–99)

## 2023-05-30 MED ORDER — INSULIN ASPART 100 UNIT/ML IJ SOLN
0.0000 [IU] | INTRAMUSCULAR | Status: DC
  Administered 2023-05-30 (×2): 1 [IU] via SUBCUTANEOUS
  Administered 2023-05-31: 2 [IU] via SUBCUTANEOUS
  Administered 2023-05-31 – 2023-06-05 (×21): 1 [IU] via SUBCUTANEOUS
  Administered 2023-06-05: 2 [IU] via SUBCUTANEOUS
  Administered 2023-06-05: 1 [IU] via SUBCUTANEOUS
  Administered 2023-06-06: 2 [IU] via SUBCUTANEOUS
  Administered 2023-06-06 – 2023-06-07 (×4): 1 [IU] via SUBCUTANEOUS
  Administered 2023-06-08: 2 [IU] via SUBCUTANEOUS
  Administered 2023-06-08: 1 [IU] via SUBCUTANEOUS
  Administered 2023-06-08: 2 [IU] via SUBCUTANEOUS
  Administered 2023-06-08: 1 [IU] via SUBCUTANEOUS
  Administered 2023-06-08: 2 [IU] via SUBCUTANEOUS
  Administered 2023-06-09 (×2): 1 [IU] via SUBCUTANEOUS
  Administered 2023-06-09 (×2): 2 [IU] via SUBCUTANEOUS
  Administered 2023-06-09 – 2023-06-10 (×2): 1 [IU] via SUBCUTANEOUS
  Administered 2023-06-10: 2 [IU] via SUBCUTANEOUS
  Administered 2023-06-10 – 2023-06-11 (×4): 1 [IU] via SUBCUTANEOUS

## 2023-05-30 NOTE — Progress Notes (Signed)
 Palliative:  ***  Yong Channel, NP Palliative Medicine Team Pager [phone] (Please see [email or web address] for schedule) Team Phone [phone]

## 2023-05-30 NOTE — Progress Notes (Signed)
 Pt off unit for Swallow Study.

## 2023-05-30 NOTE — Evaluation (Signed)
 Modified Barium Swallow Study  Patient Details  Name: Derek Yates MRN: [id number] Date of Birth: 01-07-1941  Today's Date: 05/30/2023  Modified Barium Swallow completed.  Full report located under Chart Review in the Imaging Section.  History of Present Illness Patient is an 83 y.o. male with PMH: dementia, recurrent UTI's, CVA, HTN. He presented to the hospital on 05/11/23 as a code stroke with speech deficits and left sided weakness. 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 Patient has been NPO; Cortrak placed 2/21. He failed [facility] swallow screen with RN.   Clinical Impression Pt was a little more alert for MBS, but did not initiate any swallowing. Physical assistance and repositioning of chair were used to hold his head up in a more neutral position to keep boluses in his oral cavity better, as he would take liquids off the spoon but then they would spill anteriorly out of his oral cavity. When positioned so that they could stay in his oral cavity he still had incomplete labial seal. No lingual movements were noted to attempt bolus transit. Max cues were given, including from male student's voice, to whom he responded better overall. A small amount of nectar thick liquids spilled back into his pharynx and then into his larynx. Delayed coughing was suggestive of some sensation, but no clear ejection of aspirated material was observed. SLP suctioned his oral cavity to remove the remaining barium. SLP not able to recommend a PO diet at this time. Given persistent lethargy seen this admission, there would be ongoing concern for adequate nutrition and hydration even if he had been able to swallow more on this study.  Factors that may increase risk of adverse event in presence of aspiration [name] [name] 2021): Reduced cognitive function;Limited mobility;Frail or  deconditioned;Dependence for feeding and/or oral hygiene;Weak cough;Presence of tubes (ETT, trach, NG, etc.);Aspiration of thick, dense, and/or acidic materials  Swallow Evaluation Recommendations Recommendations: NPO Medication Administration: Via alternative means      [name]., M.A. CCC-SLP Acute Rehabilitation Services Office [phone]  Secure chat preferred  05/30/2023,12:47 PM

## 2023-05-30 NOTE — Plan of Care (Signed)
 Patient remains NPO after barium swallow incomplete. TF restarted, VSS, family at bedside updated on POC. Will continue to monitor.

## 2023-05-30 NOTE — Progress Notes (Signed)
 Physical Therapy Treatment Patient Details Name: Derek Yates MRN: [id number] DOB: 10/19/1940 Today's Date: 05/30/2023   History of Present Illness 83 yo male presents to [facility] on 2/19 as code stroke for speech deficits, L weakness. 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 PMH includes dementia, recurrent UTIs, CVA, HTN.    PT Comments  Patient seen laying in supine with wife at bedside. Patient with increased lethargy and inconsistent command follow. Difficult to arouse, however patient able to communicate intermittently. Requires total A for bed mobility and transfer, sat EOB with external support to maintain posture and limit posterior/left lateral lean. Patient demonstrates severe kyphosis when seated EOB. Transitioned patient with totalA +2 back to supine after patient declined to sit in chair.  PT will continue to follow up to address deficits, and patient will benefit from continued inpatient follow up therapy, <3 hours/day.      If plan is discharge home, recommend the following: Two people to help with walking and/or transfers;Two people to help with bathing/dressing/bathroom;A lot of help with walking and/or transfers;Assistance with cooking/housework;Assistance with feeding;Assist for transportation;Help with stairs or ramp for entrance   Can travel by private vehicle     No  Equipment Recommendations  Wheelchair (measurements PT);Wheelchair cushion (measurements PT);Hospital bed;Hoyer lift    Recommendations for Other Services Rehab consult     Precautions / Restrictions Precautions Precautions: Fall Recall of Precautions/Restrictions: Impaired Precaution/Restrictions Comments: NG tube Restrictions Weight Bearing Restrictions Per Provider Order: No     Mobility  Bed Mobility Overal bed mobility: Needs Assistance Bed Mobility: Rolling, Sidelying to Sit, Sit to Supine Rolling: Total assist, +2 for physical assistance Sidelying to sit:  Total assist, +2 for physical assistance   Sit to supine: Total assist, +2 for physical assistance   General bed mobility comments: TotalA to elevate trunk and move legs EOB, strong lean to L when transitioning to a seated position. Poor trunk controla to maintain upright posture    Transfers Overall transfer level: Needs assistance Equipment used: Ambulation equipment used Transfers: Sit to/from Stand Sit to Stand: Total assist, +2 physical assistance           General transfer comment: Deferred after pt declined, preferred to stay in bed. NT notified to position patient in a chair position following bath    Ambulation/Gait                   Stairs             Wheelchair Mobility     Tilt Bed    Modified Rankin (Stroke Patients Only) Modified Rankin (Stroke Patients Only) Pre-Morbid Rankin Score: Slight disability Modified Rankin: Severe disability     Balance Overall balance assessment: Needs assistance Sitting-balance support: Feet supported, Bilateral upper extremity supported Sitting balance-Leahy Scale: Poor Sitting balance - Comments: Initially leaning to the left. External support to maintain upright posture. Severe kyphosis of C/T spine Postural control: Posterior lean, Left lateral lean                                  Communication Communication Communication: Impaired Factors Affecting Communication: Difficulty expressing self;Reduced clarity of speech  Cognition Arousal: Lethargic Behavior During Therapy: Flat affect   PT - Cognitive impairments: Attention, Initiation, Sequencing, Safety/Judgement  PT - Cognition Comments: Decreased awareness of position, posture, deficits. increased processing time. Did not observe eyes opening during this session Following commands: Impaired Following commands impaired: Follows one step commands inconsistently, Follows one step commands with increased  time    Cueing Cueing Techniques: Verbal cues, Gestural cues, Tactile cues  Exercises      General Comments General comments (skin integrity, edema, etc.): Vitals remained stable throughout the entirety of today's session      Pertinent Vitals/Pain Pain Assessment Pain Assessment: Faces Faces Pain Scale: Hurts little more Pain Location: generalized with movement Pain Descriptors / Indicators: Grimacing Pain Intervention(s): Limited activity within patient's tolerance, Monitored during session, Repositioned    Home Living                          Prior Function            PT Goals (current goals can now be found in the care plan section) Acute Rehab PT Goals Patient Stated Goal: based on subjective intake during initial eval, son wants his dad to be more alert PT Goal Formulation: With patient/family Time For Goal Achievement: 05/27/23 Potential to Achieve Goals: Fair Progress towards PT goals: Progressing toward goals    Frequency    Min 1X/week      PT Plan      Co-evaluation              AM-PAC PT "6 Clicks" Mobility   Outcome Measure  Help needed turning from your back to your side while in a flat bed without using bedrails?: Total Help needed moving from lying on your back to sitting on the side of a flat bed without using bedrails?: Total Help needed moving to and from a bed to a chair (including a wheelchair)?: Total Help needed standing up from a chair using your arms (e.g., wheelchair or bedside chair)?: Total Help needed to walk in hospital room?: Total Help needed climbing 3-5 steps with a railing? : Total 6 Click Score: 6    End of Session Equipment Utilized During Treatment: Gait belt;Oxygen Activity Tolerance: Patient limited by fatigue;Patient limited by lethargy Patient left: in bed;with call bell/phone within reach;with bed alarm set;with family/visitor present Nurse Communication: Mobility status;Other (comment) (Bed  Positioning following bath) PT Visit Diagnosis: Other abnormalities of gait and mobility (R26.89);Muscle weakness (generalized) (M62.81)     Time: 4098-1191 PT Time Calculation (min) (ACUTE ONLY): 33 min  Charges:    $Therapeutic Activity: 23-37 mins PT General Charges $$ ACUTE PT VISIT: 1 Visit                    Doreen Beam, SPT   Fredrick Geoghegan 05/30/2023, 1:05 PM

## 2023-05-30 NOTE — Progress Notes (Signed)
 Speech Language Pathology Treatment: Dysphagia  Patient Details Name: Derek Yates MRN: [id number] DOB: Aug 21, 1940 Today's Date: 05/30/2023 Time: 4098-1191 SLP Time Calculation (min) (ACUTE ONLY): 12 min  Assessment / Plan / Recommendation Clinical Impression  Pt was seen this morning to assess for readiness to complete MBS, which has been challenging to coordinate due to pt lethargy. He is quite sleepy this morning, and doesn't open his eyes, but he does respond verbally to questions. He is hard to understand, but his wife did understand him when he said the Cortrak was bothering him. He is asking for water, but does not take it when offered despite cues and administration by wife as therapeutic agent. MBS was tentatively scheduled for this morning as last week family had thought he was more alert in the mornings.   Discussed options with wife, including deferring MBS in light of mentation. We also discussed that since he is currently asking for water and trying to verbally communicate, we could take him down to fluoro and see if the transfer to the chair stimulates him enough to wake up more for completion of study. She is in favor of Derek Yates taking him down to make an attempt on the study. MD and RN notified and will try MBS this morning.    HPI HPI: Patient is an 83 y.o. male with PMH: dementia, recurrent UTI's, CVA, HTN. He presented to the hospital on 05/11/23 as a code stroke with speech deficits and left sided weakness. 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 Patient has been NPO; Cortrak placed 2/21. He failed [facility] swallow screen with RN.      SLP Plan  MBS      Recommendations for follow up therapy are one component of a multi-disciplinary discharge planning process, led by the attending physician.  Recommendations may be updated based on patient status, additional  functional criteria and insurance authorization.    Recommendations  Diet recommendations: NPO Medication Administration: Via alternative means                  Oral care QID;Oral care prior to ice chip/H20   Frequent or constant Supervision/Assistance Dysphagia, unspecified (R13.10)     MBS     [name]., M.A. CCC-SLP Acute Rehabilitation Services Office [phone]  Secure chat preferred   05/30/2023, 9:43 AM

## 2023-05-30 NOTE — Progress Notes (Signed)
 PT Cancellation Note  Patient Details Name: Derek Yates MRN: [id number] DOB: 11-Dec-1940   Cancelled Treatment:    Reason Eval/Treat Not Completed: Patient at procedure or test/unavailable (leaving for MBS)  Lillia Pauls, PT, DPT Acute Rehabilitation Services Office [phone]    [name] 05/30/2023, 10:09 AM

## 2023-05-30 NOTE — TOC Progression Note (Signed)
 Transition of Care [facility]) - Progression Note    Patient Details  Name: Derek Yates MRN: [id number] Date of Birth: 02-Jun-1940  [facility]) CM/SW Contact  Eduard Roux, [place] Phone Number: 05/30/2023, 12:33 PM  Clinical Narrative:     TOC continues to follow   Patient still with cortrak   Antony Blackbird, MSW, LCSW Clinical Social Worker     Expected Discharge Plan: Skilled Nursing Facility Barriers to Discharge: Continued Medical Work up, SNF Pending bed offer, Insurance Authorization  Expected Discharge Plan and Services   Discharge Planning Services: CM Consult Post Acute Care Choice: Skilled Nursing Facility Living arrangements for the past 2 months: Single Family Home                                       Social Determinants of Health (SDOH) Interventions SDOH Screenings   Food Insecurity: Patient Unable To Answer (05/13/2023)  Housing: Patient Unable To Answer (05/13/2023)  Transportation Needs: Patient Unable To Answer (05/13/2023)  Utilities: Patient Unable To Answer (05/13/2023)  Depression (PHQ2-9): Medium Risk (02/10/2023)  Financial Resource Strain: Low Risk  (06/20/2021)   Received from [facility], [facility]  Social Connections: Patient Unable To Answer (05/13/2023)  Tobacco Use: Low Risk  (05/04/2023)    Readmission Risk Interventions     No data to display

## 2023-05-30 NOTE — Progress Notes (Signed)
  Progress Note   Patient: Derek Yates QMV:[id number] DOB: December 28, 1940 DOA: 05/11/2023     19 DOS: the patient was seen and examined on 05/30/2023   Brief hospital course: 83 y.o. male past medical history significant for essential hypertension, CVA recurrent UTI and dementia presented to [facility] with code stroke CT of the head showed intraparenchymal hemorrhage to the right caudate extending with intraventricular lesions, CCM consulted on 05/15/2023 for fevers for 48 hours, thought to be related to aspiration pneumonia transferred to CCM on 05/17/2023.   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 Physical therapy evaluated the patient and recommended inpatient rehab Is not a candidate for antithrombotic therapy due to ICH CAA Pt continues with tube feed per coreTrack, family remains undecided to place PEG tube. Palliative care on board for continued goals of care. No feeding tube per wishes noted Concerns for continued aspiration PNA, continued on abx as tolerated Pt now s/p MBS. Pt was more alert, however did not initiate any swallowing, thus NPO remains   Recurrent aspiration pneumonia: CT chest on 05/17/2023 showed lower lobes multifocal mucous plugging and patchy airspace infiltrate concerning for aspiration pneumonia Later with fevers, pt was continued on cefepime and Flagyl on 3/2 Palliative care is now on board to assist with goals of care   Acute respiratory failure with hypoxia due to aspiration pneumonia: Now been weaned to minimal O2 Continue chest physiotherapy and inhalers.   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 Continue statins.   Acute kidney injury: Likely hemodynamic mediated resolved with IV fluid resuscitation. Recent concerns of dehydration thus 200cc free water flush continued per tube q4h    Essential hypertension: Continue nifedipine.   Hyperlipidemia: Continue statins.   Dysphagia: Currently n.p.o. with a core track. Did not initiate swallow on 3/10 MBS, thus pt remains NPO   Advance dementia: Severe memory loss   Protein-calorie malnutrition, severe Noted.      Subjective:Unable to assess given deficits  Physical Exam: Vitals:   05/30/23 0326 05/30/23 0707 05/30/23 0736 05/30/23 1100  BP: 116/70  111/63 115/65  Pulse: 91  83 89  Resp: 20  (!) 26 (!) 21  Temp: 98.5 F (36.9 C)  98.5 F (36.9 C) 98.7 F (37.1 C)  TempSrc: Axillary  Axillary Oral  SpO2: 96%  95% 96%  Weight:  65.9 kg     General exam: Awake, laying in bed, in nad Respiratory system: Normal respiratory effort, no audible wheezing Cardiovascular system: perfused, no notable jvd Gastrointestinal system: Soft, NG in place Central nervous system: no active seizures, no tremors Extremities: Perfused, no clubbing Skin: Normal skin turgor, no notable skin lesions seen Psychiatry: Difficult to assess given deficits  Data Reviewed:  Labs reviewed: Na 137, K 4.4, Cr 0.71, WBC 8.6, Hgb 10.3  Family Communication: Pt in room, family currently not at bedside  Disposition: Status is: Inpatient Remains inpatient appropriate because: Severity of illness  Planned Discharge Destination:  Unclear at this time    Author: Rickey Barbara, MD 05/30/2023 2:58 PM  For on call review [email or web address].

## 2023-05-31 ENCOUNTER — Encounter (HOSPITAL_COMMUNITY): Payer: Self-pay | Admitting: Neurology

## 2023-05-31 ENCOUNTER — Ambulatory Visit: Payer: Medicare Other | Admitting: Internal Medicine

## 2023-05-31 DIAGNOSIS — I6389 Other cerebral infarction: Secondary | ICD-10-CM | POA: Diagnosis not present

## 2023-05-31 DIAGNOSIS — I610 Nontraumatic intracerebral hemorrhage in hemisphere, subcortical: Secondary | ICD-10-CM | POA: Diagnosis not present

## 2023-05-31 DIAGNOSIS — R1310 Dysphagia, unspecified: Secondary | ICD-10-CM | POA: Diagnosis not present

## 2023-05-31 DIAGNOSIS — Z7189 Other specified counseling: Secondary | ICD-10-CM | POA: Diagnosis not present

## 2023-05-31 DIAGNOSIS — Z515 Encounter for palliative care: Secondary | ICD-10-CM | POA: Diagnosis not present

## 2023-05-31 DIAGNOSIS — F03A0 Unspecified dementia, mild, without behavioral disturbance, psychotic disturbance, mood disturbance, and anxiety: Secondary | ICD-10-CM | POA: Diagnosis not present

## 2023-05-31 LAB — CBC
HCT: 29.3 % — ABNORMAL LOW (ref 39.0–52.0)
Hemoglobin: 10.2 g/dL — ABNORMAL LOW (ref 13.0–17.0)
MCH: 29 pg (ref 26.0–34.0)
MCHC: 34.8 g/dL (ref 30.0–36.0)
MCV: 83.2 fL (ref 80.0–100.0)
Platelets: 364 10*3/uL (ref 150–400)
RBC: 3.52 MIL/uL — ABNORMAL LOW (ref 4.22–5.81)
RDW: 16.8 % — ABNORMAL HIGH (ref 11.5–15.5)
WBC: 6.6 10*3/uL (ref 4.0–10.5)
nRBC: 0 % (ref 0.0–0.2)

## 2023-05-31 LAB — COMPREHENSIVE METABOLIC PANEL
ALT: 21 U/L (ref 0–44)
AST: 18 U/L (ref 15–41)
Albumin: 2.3 g/dL — ABNORMAL LOW (ref 3.5–5.0)
Alkaline Phosphatase: 45 U/L (ref 38–126)
Anion gap: 7 (ref 5–15)
BUN: 32 mg/dL — ABNORMAL HIGH (ref 8–23)
CO2: 25 mmol/L (ref 22–32)
Calcium: 8.8 mg/dL — ABNORMAL LOW (ref 8.9–10.3)
Chloride: 103 mmol/L (ref 98–111)
Creatinine, Ser: 0.68 mg/dL (ref 0.61–1.24)
GFR, Estimated: >60 mL/min (ref >60)
Glucose, Bld: 121 mg/dL — ABNORMAL HIGH (ref 70–99)
Potassium: 4.2 mmol/L (ref 3.5–5.1)
Sodium: 135 mmol/L (ref 135–145)
Total Bilirubin: 0.6 mg/dL (ref 0.0–1.2)
Total Protein: 6.1 g/dL — ABNORMAL LOW (ref 6.5–8.1)

## 2023-05-31 LAB — GLUCOSE, CAPILLARY
Glucose-Capillary: 130 mg/dL — ABNORMAL HIGH (ref 70–99)
Glucose-Capillary: 140 mg/dL — ABNORMAL HIGH (ref 70–99)
Glucose-Capillary: 151 mg/dL — ABNORMAL HIGH (ref 70–99)
Glucose-Capillary: 139 mg/dL — ABNORMAL HIGH (ref 70–99)
Glucose-Capillary: 127 mg/dL — ABNORMAL HIGH (ref 70–99)
Glucose-Capillary: 128 mg/dL — ABNORMAL HIGH (ref 70–99)

## 2023-05-31 NOTE — Progress Notes (Signed)
 SLP Note  Patient Details Name: Derek Yates MRN: [id number] DOB: [birth year]   SLP met with son/family prior to their meeting with palliative care. Education was provided about how MBS went on previous date with no swallow initiation. SLP expressed concerns about current level of function and how this is actually a decline as he was initiating a swallow last week. Opportunity was given for questions, which were answered within scope. SLP will continue to follow up as indicated.     [name]., M.A. CCC-SLP Acute Rehabilitation Services Office [phone]  Secure chat preferred  05/31/2023, 3:20 PM

## 2023-05-31 NOTE — Progress Notes (Signed)
 Occupational Therapy Treatment Patient Details Name: Derek Yates MRN: [id number] DOB: [birth year] Today's Date: 05/31/2023   History of present illness 83 yo male presents to [facility] on 2/19 as code stroke for speech deficits, L weakness. 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 PMH includes dementia, recurrent UTIs, CVA, HTN.   OT comments  Patient initially alert and eyes wide open, but quickly faded with minimal responses.  Patient has not made much progress, if at all, toward patient focused goals.  OT to reassess ability to participate, and may discontinue in the acute setting.  Of note, Nephew in the room, stating family is deciding on possible comfort care.  SNF for possible LTC is looking more likely.        If plan is discharge home, recommend the following:  Assist for transportation;A lot of help with bathing/dressing/bathroom;Two people to help with walking and/or transfers;Assistance with feeding   Equipment Recommendations  None recommended by OT    Recommendations for Other Services      Precautions / Restrictions Precautions Precautions: Fall Recall of Precautions/Restrictions: Impaired Precaution/Restrictions Comments: NG tube Restrictions Weight Bearing Restrictions Per Provider Order: No       Mobility Bed Mobility Overal bed mobility: Needs Assistance Bed Mobility: Supine to Sit     Supine to sit: Total assist          Transfers Overall transfer level: Needs assistance Equipment used: 1 person hand held assist Transfers: Sit to/from Stand, Bed to chair/wheelchair/BSC Sit to Stand: Total assist Stand pivot transfers: Total assist               Balance Overall balance assessment: Needs assistance Sitting-balance support: Feet supported Sitting balance-Leahy Scale: Poor   Postural control: Posterior lean Standing balance support: Bilateral upper extremity supported Standing balance-Leahy Scale: Zero                              ADL either performed or assessed with clinical judgement   ADL   Eating/Feeding: NPO   Grooming: Total assistance;Sitting                       Toileting- Clothing Manipulation and Hygiene: Total assistance;Bed level              Extremity/Trunk Assessment Upper Extremity Assessment RUE Deficits / Details: very rigid and continues to resist movement, good strength LUE Deficits / Details: very rigid and continues to resist movement, good strength   Lower Extremity Assessment Lower Extremity Assessment: Defer to PT evaluation   Cervical / Trunk Assessment Cervical / Trunk Assessment: Kyphotic    Vision   Vision Assessment?: Vision impaired- to be further tested in functional context   Perception Perception Perception: Not tested   Praxis Praxis Praxis: Not tested   Communication Communication Communication: Impaired Factors Affecting Communication: Difficulty expressing self;Reduced clarity of speech   Cognition Arousal: Lethargic Behavior During Therapy: Flat affect Cognition: History of cognitive impairments                               Following commands: Impaired Following commands impaired: Follows one step commands inconsistently      Cueing   Cueing Techniques: Verbal cues, Tactile cues  Exercises      Shoulder Instructions       General Comments  Pertinent Vitals/ Pain       Pain Assessment Pain Assessment: PAINAD Breathing: normal Negative Vocalization: occasional moan/groan, low speech, negative/disapproving quality Facial Expression: sad, frightened, frown Body Language: tense, distressed pacing, fidgeting Consolability: no need to console PAINAD Score: 3 Pain Descriptors / Indicators: Grimacing Pain Intervention(s): Monitored during session  Home Living Family/patient expects to be discharged to:: Unsure Living Arrangements: Spouse/significant other                                       Prior Functioning/Environment              Frequency  Min 1X/week        Progress Toward Goals  OT Goals(current goals can now be found in the care plan section)  Progress towards OT goals: OT to reassess next treatment  Acute Rehab OT Goals OT Goal Formulation: Patient unable to participate in goal setting Time For Goal Achievement: 06/07/23 Potential to Achieve Goals: Fair  Plan      Co-evaluation                 AM-PAC OT "6 Clicks" Daily Activity     Outcome Measure   Help from another person eating meals?: Total Help from another person taking care of personal grooming?: Total Help from another person toileting, which includes using toliet, bedpan, or urinal?: Total Help from another person bathing (including washing, rinsing, drying)?: Total Help from another person to put on and taking off regular upper body clothing?: Total Help from another person to put on and taking off regular lower body clothing?: Total 6 Click Score: 6    End of Session Equipment Utilized During Treatment: Gait belt  OT Visit Diagnosis: Unsteadiness on feet (R26.81);Other symptoms and signs involving cognitive function;Muscle weakness (generalized) (M62.81)   Activity Tolerance Treatment limited secondary to medical complications (Comment)   Patient Left in chair;with call bell/phone within reach;with chair alarm set   Nurse Communication          Time: 4098-1191 OT Time Calculation (min): 23 min  Charges: OT General Charges $OT Visit: 1 Visit OT Treatments $Self Care/Home Management : 8-22 mins $Therapeutic Activity: 8-22 mins  05/31/2023  RP, OTR/L  Acute Rehabilitation Services  Office:  [phone]   Suzanna Obey 05/31/2023, 1:47 PM

## 2023-05-31 NOTE — Progress Notes (Signed)
  Progress Note   Patient: Derek Yates [name]:[id number] DOB: Apr 30, 1940 DOA: 05/11/2023     20 DOS: the patient was seen and examined on 05/31/2023   Brief hospital course: 83 y.o. male past medical history significant for essential hypertension, CVA recurrent UTI and dementia presented to [facility] with code stroke CT of the head showed intraparenchymal hemorrhage to the right caudate extending with intraventricular lesions, CCM consulted on 05/15/2023 for fevers for 48 hours, thought to be related to aspiration pneumonia transferred to CCM on 05/17/2023.   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 Pt continues with tube feed per coreTrack for now. Pt's wishes are for no feeding tubes Concerns for continued aspiration PNA, pt had been continued on abx  Pt now s/p MBS. Pt was more alert, however did not initiate any swallowing, thus NPO remains Palliative Care following. Family has now decided on residential hospice. Plan to cont coretrak until discharge. TOC following   Recurrent aspiration pneumonia: CT chest on 05/17/2023 showed lower lobes multifocal mucous plugging and patchy airspace infiltrate concerning for aspiration pneumonia Pt had been continued on cefepime and Flagyl as of 3/2 Palliative care is now on board, plan for residential hospice per above   Acute respiratory failure with hypoxia due to aspiration pneumonia: Now been weaned to minimal O2 Continue chest physiotherapy and inhalers.   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 Continued statins.   Acute kidney injury: Likely hemodynamic mediated resolved with IV fluid resuscitation. Recent concerns of dehydration thus now on 200cc free water flush continued per tube q4h   Essential hypertension: Continued nifedipine.   Hyperlipidemia: Continued  statins.   Dysphagia: Currently n.p.o. with a core track. Did not initiate swallow on 3/10 MBS, thus pt remained NPO Plan to remove coretrak prior to d/c to residential hospice   Advance dementia: Severe memory loss   Protein-calorie malnutrition, severe On tube feed      Subjective:Cannot assess given deficits  Physical Exam: Vitals:   05/31/23 0756 05/31/23 0815 05/31/23 1111 05/31/23 1540  BP:  (!) 101/59 106/65 109/66  Pulse: 83 79 84 87  Resp: (!) 21 (!) 23 (!) 30 (!) 25  Temp:  98.7 F (37.1 C) 98.4 F (36.9 C) 98.6 F (37 C)  TempSrc:  Axillary Oral Oral  SpO2: 94% 95% 95% 94%  Weight:      Height:   5\' 8"  (1.727 m)    General exam: laying in bed, in no acute distress Respiratory system: normal chest rise, clear, no audible wheezing Cardiovascular system: Perfused, no notable jvd Gastrointestinal system: Nondistended Central nervous system: No seizures, no tremors Extremities: No cyanosis, no joint deformities Skin: No rashes, no pallor Psychiatry: difficult to assess given deficits  Data Reviewed:  Labs reviewed: Na 135, K 4.2, Cr 0.68, WBC 6.6, Hgb 10.2  Family Communication: Pt in room, family currently not at bedside  Disposition: Status is: Inpatient Remains inpatient appropriate because: Severity of illness  Planned Discharge Destination:  Hospice    Author: Rickey Barbara, MD 05/31/2023 6:47 PM  For on call review [email or web address].

## 2023-05-31 NOTE — Progress Notes (Signed)
 Palliative:  HPI: 83 y.o. 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 Hospitalization complicated by aspiration pneumonia as well as possible neurogenic fever.    I met today with Mr. Age nephew. He is trying to help son, Beckey Downing, with path forward. He had many good questions. We met a couple times and then we met together with Beckey Downing and another family member. SLP Vernona Rieger was able to explain MBS and swallow prognosis with Beckey Downing and family. We sat down and had discussion about goals of care and path forward. Beckey Downing confirms no desire for PEG placement. He does not want his father to have to go to a nursing facility. We discussed next step of removing Cortrak and focus on comfort care. We discussed why we do not recommend artificial hydration in these situations and at end of life. We reviewed comfort care and use of medications such as morphine if needed to achieve comfort. Beckey Downing did go visit hospice facility as I suggested to his family earlier today. He did report that it was a nice facility and he would be open to his father going there. He still struggles with this being "hospice" but he understands that the care they provide would be the same we would provide here. He reports that his mother is visiting today at 4pm and she will need to approve this before agreeing to transfer. Family reassure him that they believe it will be a more comfortable and more peaceful environment for both of his parents. Beckey Downing would like to speak with hospice liaison as he has questions about logistics and cost. He would like to speak and have a plan with hospice before d/c Cortrak and officially pursuing comfort care. Plan will be for transition to full comfort care over the next few days. Family are considering transition to hospice  facility.   All questions/concerns addressed. Emotional support provided.   Exam: Awakens easily. No overt distress. Appears stressed and difficult to understand his speech at times. Breathing regular, unlabored. Abd flat. Cortak in place.   Plan: - DNR/DNI  - No PEG  - Plans for d/c Cortrak and transition to full comfort care in the coming days  - Family considering transition to hospice facility   60 min  Yong Channel, NP Palliative Medicine Team Pager [phone] (Please see [email or web address] for schedule) Team Phone [phone]

## 2023-05-31 NOTE — Progress Notes (Signed)
 PT scoring a "Yellow" MEWS driven by mental status and increased RR with known PNA and also admitted for ICH. There has been no changes in PT's status. Attending is aware as well as Consulting civil engineer. There are family discussions and active pursuit of hospice care at this time. PT at this time is not Comfort care but DNR. With discussion with family member consensus sounds like there is no desire to escalate his care.    05/31/23 1113  Assess: MEWS Score  Temp 98.4 F (36.9 C)  BP 106/65  MAP (mmHg) 77  Pulse Rate 85  ECG Heart Rate 85  Resp (!) 26  Level of Consciousness Responds to Voice  SpO2 94 %  O2 Device Room Air  Patient Activity (if Appropriate) In bed  Assess: MEWS Score  MEWS Temp 0  MEWS Systolic 0  MEWS Pulse 0  MEWS RR 2  MEWS LOC 1  MEWS Score 3  MEWS Score Color Yellow  Assess: if the MEWS score is Yellow or Red  Were vital signs accurate and taken at a resting state? Yes  Does the patient meet 2 or more of the SIRS criteria? No  Does the patient have a confirmed or suspected source of infection? Yes  MEWS guidelines implemented  Yes, yellow  Treat  MEWS Interventions Considered administering scheduled or prn medications/treatments as ordered  Take Vital Signs  Increase Vital Sign Frequency  Yellow: Q2hr x1, continue Q4hrs until patient remains green for 12hrs  Escalate  MEWS: Escalate Yellow: Discuss with charge nurse and consider notifying provider and/or RRT  Notify: Charge Nurse/RN  Name of Charge Nurse/RN Notified Nate  Provider Notification  Provider Name/Title Dr. Rhona Leavens  Date Provider Notified 05/31/23  Time Provider Notified 1113  Method of Notification Page  Notification Reason Other (Comment)  Provider response No new orders  Assess: SIRS CRITERIA  SIRS Temperature  0  SIRS Respirations  1  SIRS Pulse 0  SIRS WBC 0  SIRS Score Sum  1

## 2023-06-01 DIAGNOSIS — I6389 Other cerebral infarction: Secondary | ICD-10-CM | POA: Diagnosis not present

## 2023-06-01 DIAGNOSIS — R1310 Dysphagia, unspecified: Secondary | ICD-10-CM | POA: Diagnosis not present

## 2023-06-01 DIAGNOSIS — Z515 Encounter for palliative care: Secondary | ICD-10-CM | POA: Diagnosis not present

## 2023-06-01 DIAGNOSIS — Z7189 Other specified counseling: Secondary | ICD-10-CM | POA: Diagnosis not present

## 2023-06-01 LAB — GLUCOSE, CAPILLARY
Glucose-Capillary: 122 mg/dL — ABNORMAL HIGH (ref 70–99)
Glucose-Capillary: 122 mg/dL — ABNORMAL HIGH (ref 70–99)
Glucose-Capillary: 124 mg/dL — ABNORMAL HIGH (ref 70–99)
Glucose-Capillary: 127 mg/dL — ABNORMAL HIGH (ref 70–99)
Glucose-Capillary: 129 mg/dL — ABNORMAL HIGH (ref 70–99)
Glucose-Capillary: 135 mg/dL — ABNORMAL HIGH (ref 70–99)

## 2023-06-01 NOTE — Progress Notes (Addendum)
 Nutrition Follow-up  DOCUMENTATION CODES:   Severe malnutrition in context of chronic illness  INTERVENTION:  Continue tube feeding via Cortrak tube: Osmolite 1.5 at 40 ml/h (960 ml per day) Prosource TF20 60 ml BID FWF added per MD - Q4 hours   Provides 1600 kcal, 100 gm protein, 1929 ml free water daily   MVI with minerals daily via tube  Banatrol BID, each packet provides 5g soluble fiber Monitor for PEG tube placement and adjust to bolus feeds, if indicated  NUTRITION DIAGNOSIS:  Severe Malnutrition related to chronic illness as evidenced by severe fat depletion, severe muscle depletion.  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  2/19 admitted w/ R caudate bleed 2/21 - s/p cortrak placement; tip gastric  2/22 - bedside swallow: NPO, CT head, CXR 2/25 - CT of chest: PNA 2/26 - transfer to 4NP 2/28 - FMS and flatus pouch removed 3/7 - unable to complete MBSS 3/10- MBSS: decline in function  Patient continues with little to no improvement. Actually showed decline from recent baseline during MBS on 3/10, as he did not initiate swallow. Family meeting with PMT and has decided against PEG. Moving toward residential hospice facility. They have requested Cortrak to continue until discharge.  Admit Weight: 61.9kg Current Weight: 63.5kg Lowest Weight: 61.2kg on 2/26  Continues to aspirate. Tolerating tube feedings at goal rate. Abdomen with no distention noted. Skin integrity stable/desirable. BM today. Positive flatus and bowel sounds.  Meds: Banatrol, SSI, MVI, pantoprazole, IV ABX  Labs: no labs today, lfrom 3/11 reviewed and unremarkable  Diet Order:   Diet Order              Diet NPO time specified Except for: Other (See Comments)  Diet effective now            EDUCATION NEEDS:  Not appropriate for education at this time  Skin:  Skin Assessment: Reviewed RN Assessment  Last BM:  3/12  Height:  Ht Readings from Last 1 Encounters:  05/31/23 5\' 8"  (1.727 m)   Weight:  Wt Readings from Last 1 Encounters:  06/01/23 63.5 kg   Ideal Body Weight:  70 kg  BMI:  Body mass index is 21.29 kg/m.  Estimated Nutritional Needs:   Kcal:  1600-1800  Protein:  90-100 grams  Fluid:  >1.6 L/day  Myrtie Cruise MS, RD, LDN Registered Dietitian Clinical Nutrition RD Inpatient Contact Info in Amion

## 2023-06-01 NOTE — TOC Progression Note (Signed)
 Transition of Care (TOC) - Progression Note  Donn Pierini RN,BSN Transitions of Care Unit 4NP (Non Trauma)- RN Case Manager See Treatment Team for direct Phone #   Patient Details  Name: Derek Yates MRN: [id number] Date of Birth: 1940-06-12  Transition of Care [facility]) CM/SW Contact  Zenda Alpers, Lenn Sink, RN Phone Number: 06/01/2023, 4:48 PM  Clinical Narrative:    Referral made to [facility] as per son/family request for possible Hospice Home in [place]- per liaison they will assess pt tomorrow, and reach out to son to answer questions.    Expected Discharge Plan: Skilled Nursing Facility Barriers to Discharge: Continued Medical Work up, SNF Pending bed offer, English as a second language teacher  Expected Discharge Plan and Services   Discharge Planning Services: CM Consult Post Acute Care Choice: Hospice Living arrangements for the past 2 months: Single Family Home                             [facility] Agency: [facility] [place] Date [facility] Agency Contacted: 05/31/23   Representative spoke with at [facility] Agency: Efraim Kaufmann   Social Determinants of Health (SDOH) Interventions SDOH Screenings   Food Insecurity: Patient Unable To Answer (05/13/2023)  Housing: Patient Unable To Answer (05/13/2023)  Transportation Needs: Patient Unable To Answer (05/13/2023)  Utilities: Patient Unable To Answer (05/13/2023)  Depression (PHQ2-9): Medium Risk (02/10/2023)  Financial Resource Strain: Low Risk  (06/20/2021)   Received from [facility], [facility]  Social Connections: Patient Unable To Answer (05/13/2023)  Tobacco Use: Low Risk  (05/31/2023)    Readmission Risk Interventions     No data to display

## 2023-06-01 NOTE — Progress Notes (Signed)
 Palliative:  HPI: 83 y.o. 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 Hospitalization complicated by aspiration pneumonia as well as possible neurogenic fever.   I met today briefly with Mr. Hackbart but no family at bedside. He is alert but seems tired and stressed (holding his head). Reassurance and encouraged him to rest. He denies pain or discomfort. Difficult to understand him at times.   I called and spoke with son, Beckey Downing. Beckey Downing shares that family have discussed and there are some concerns from some family members about hospice. They agree that the facility is very nice. Beckey Downing is hesitant to begin full comfort and remove Cortrak until plan in place. Beckey Downing shares that he does not want to remove Cortrak and do comfort care just to move his father out of the hospital in 2-3 days - I shared that I fear this is a very likely scenario. Beckey Downing enquires about the reality of staying in the hospital for comfort care. I shared concerns and shared that I will discuss with physician and get back to him.   I discussed with Dr. David Stall who plans to reach out to [facility] as well.   I spoke with Beckey Downing again and explained that his father's prognosis is likely closer to a week or so and people who stay in the hospital for end of life care are typically prognosis of hours to a couple days. I explained that hospice placement is typically the preferred next step as they would provide the same comfort care as in the hospital and it is a more peaceful environment. Beckey Downing shares that he understands and will speak again with his family. He reports that he has a voicemail from hospice and he will reach back out to her as well.   All questions/concerns addressed to the best of my ability. Emotional support provided. Updated hospice  liaison as well.   Exam: Awakens easily. No overt distress. Appears stressed and difficult to understand his speech at times. Breathing regular, unlabored. Abd flat. Cortak in place.    Plan: - DNR/DNI  - No PEG  - Plans for d/c Cortrak and transition to full comfort care in the coming days  - Family considering transition to hospice facility   50 min  Yong Channel, NP Palliative Medicine Team Pager [phone] (Please see [email or web address] for schedule) Team Phone [phone]

## 2023-06-01 NOTE — TOC Progression Note (Signed)
 Transition of Care (TOC) - Progression Note  Donn Pierini RN,BSN Transitions of Care Unit 4NP (Non Trauma)- RN Case Manager See Treatment Team for direct Phone #    Patient Details  Name: Derek Yates MRN: [id number] Date of Birth: 1940-11-16  Transition of Care [facility]) CM/SW Contact  Zenda Alpers, Lenn Sink, RN Phone Number: 06/01/2023, 4:49 PM  Clinical Narrative:    Per [facility] liaison pt has been approved for Hospice - for placement at [facility] in [place]- awaiting son to complete consent paperwork.  Will await confirmation from [facility] that pt is ready for transport and has bed at [facility].    Expected Discharge Plan: Skilled Nursing Facility Barriers to Discharge: Continued Medical Work up, SNF Pending bed offer, English as a second language teacher  Expected Discharge Plan and Services   Discharge Planning Services: CM Consult Post Acute Care Choice: Hospice Living arrangements for the past 2 months: Single Family Home                             [facility] Agency: [facility] [place] Date [facility] Agency Contacted: 05/31/23   Representative spoke with at [facility] Agency: Efraim Kaufmann   Social Determinants of Health (SDOH) Interventions SDOH Screenings   Food Insecurity: Patient Unable To Answer (05/13/2023)  Housing: Patient Unable To Answer (05/13/2023)  Transportation Needs: Patient Unable To Answer (05/13/2023)  Utilities: Patient Unable To Answer (05/13/2023)  Depression (PHQ2-9): Medium Risk (02/10/2023)  Financial Resource Strain: Low Risk  (06/20/2021)   [facility] from [facility], [facility]  Social Connections: Patient Unable To Answer (05/13/2023)  Tobacco Use: Low Risk  (05/31/2023)    Readmission Risk Interventions     No data to display

## 2023-06-01 NOTE — Progress Notes (Signed)
 [facility] PROGRESS NOTE    Progress Note  Derek Yates  OZH:[id number] DOB: 12/26/1940 DOA: 05/11/2023 PCP: [name], MD     Brief Narrative:   Derek Yates is an 83 y.o. male past medical history significant for essential hypertension, CVA recurrent UTI and dementia presented to [facility] with code stroke CT of the head showed intraparenchymal hemorrhage to the right caudate extending with intraventricular lesions, CCM consulted on 05/15/2023 for fevers for 48 hours, thought to be related to aspiration pneumonia transferred to CCM on 05/17/2023. 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 Physical therapy evaluated the patient and recommended inpatient rehab Is not a candidate for antithrombotic therapy due to ICH CAA Still with a core track with tube feeding at almost 20 days with core track.  Family has refused PEG tube. Family leaning to a residential hospice facility. They have requested continue core track until discharge. Palliative care on board patient is a DNR/DNI, awaiting residential hospice facility placement.  s.  Recurrent aspiration pneumonia: CT chest on 05/17/2023 showed lower lobes multifocal mucous plugging and patchy airspace infiltrate concerning for aspiration pneumonia. Has remained afebrile. Palliative care on board  Acute respiratory failure with hypoxia due to aspiration pneumonia: Now been weaned to room air. Continue chest physiotherapy and inhalers.  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 Continue statins.  Acute kidney injury: Likely hemodynamic mediated resolved with IV fluid resuscitation.  Essential hypertension: Continue nifedipine.  Hyperlipidemia: Continue statins.  Dysphagia: Currently n.p.o. with a core track. Speech to evaluate early this  week  Advance dementia: Severe memory loss  Protein-calorie malnutrition, severe Noted.   DVT prophylaxis: SCD Family Communication:none Status is: Inpatient Remains inpatient appropriate because: Acute intracranial hemorrhage, okay to transfer to the floor    Code Status:     Code Status Orders  (From admission, onward)           Start     Ordered   05/17/23 1008  Do not attempt resuscitation (DNR)- Limited -Do Not Intubate (DNI)  (Code Status)  Continuous       Question Answer Comment  If pulseless and not breathing No CPR or chest compressions.   In Pre-Arrest Conditions (Patient Is Breathing and Has A Pulse) Do not intubate. Provide all appropriate non-invasive medical interventions. Avoid ICU transfer unless indicated or required.   Consent: Discussion documented in EHR or advanced directives reviewed      05/17/23 1008           Code Status History     Date Active Date Inactive Code Status Order ID Comments User Context   05/11/2023 2022 05/17/2023 1008 Full Code [id number]  [name], MD Inpatient         IV Access:   Peripheral IV   Procedures and diagnostic studies:   DG Swallowing Func-Speech Pathology Result Date: 05/30/2023 Modified Barium Swallow Study Patient Details Name: Derek Yates MRN: [id number] Date of Birth: Dec 30, 1940 Today's Date: 05/30/2023 HPI/PMH: HPI: Patient is an 83 y.o. male with PMH: dementia, recurrent UTI's, CVA, HTN. He presented to the hospital on 05/11/23 as a code stroke with speech deficits and left sided weakness. 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 Patient has been NPO; Cortrak placed  2/21. He failed [facility] swallow screen with RN. Clinical Impression: Clinical Impression: Pt was a little more alert for MBS, but did not initiate any swallowing. Physical assistance and repositioning of  chair were used to hold his head up in a more neutral position to keep boluses in his oral cavity better, as he would take liquids off the spoon but then they would spill anteriorly out of his oral cavity. When positioned so that they could stay in his oral cavity he still had incomplete labial seal. No lingual movements were noted to attempt bolus transit. Max cues were given, including from male student's voice, to whom he responded better overall. A small amount of nectar thick liquids spilled back into his pharynx and then into his larynx. Delayed coughing was suggestive of some sensation, but no clear ejection of aspirated material was observed. SLP suctioned his oral cavity to remove the remaining barium. SLP not able to recommend a PO diet at this time. Given persistent lethargy seen this admission, there would be ongoing concern for adequate nutrition and hydration even if he had been able to swallow more on this study. Factors that may increase risk of adverse event in presence of aspiration [name] & [name] 2021): Factors that may increase risk of adverse event in presence of aspiration [name] & [name] 2021): Reduced cognitive function; Limited mobility; Frail or deconditioned; Dependence for feeding and/or oral hygiene; Weak cough; Presence of tubes (ETT, trach, NG, etc.); Aspiration of thick, dense, and/or acidic materials Recommendations/Plan: Swallowing Evaluation Recommendations Swallowing Evaluation Recommendations Recommendations: NPO Medication Administration: Via alternative means Treatment Plan Treatment Plan Follow-up recommendations: Skilled nursing-short term rehab (<3 hours/day) Functional status assessment: Patient has had a recent decline in their functional status and/or demonstrates limited ability to make significant improvements in function in a reasonable and predictable amount of time. Treatment frequency: Min 2x/week Treatment duration: 2 weeks Interventions: Aspiration precaution  training; Patient/family education; Trials of upgraded texture/liquids Recommendations Recommendations for follow up therapy are one component of a multi-disciplinary discharge planning process, led by the attending physician.  Recommendations may be updated based on patient status, additional functional criteria and insurance authorization. Assessment: Orofacial Exam: Orofacial Exam Oral Cavity - Dentition: Poor condition; Missing dentition Anatomy: Anatomy: WFL Boluses Administered: Boluses Administered Boluses Administered: Thin liquids (Level 0); Mildly thick liquids (Level 2, nectar thick)  Oral Impairment Domain: Oral Impairment Domain Lip Closure: Escape beyond mid-chin Bolus transport/lingual motion: Minimal-no tongue motion Oral residue: Minimal to no clearance Location of oral residue : Floor of mouth; Tongue; Lateral sulci Initiation of pharyngeal swallow : No visible initiation at any location  Pharyngeal Impairment Domain: No data recorded Esophageal Impairment Domain: No data recorded Pill: No data recorded Penetration/Aspiration Scale Score: Penetration/Aspiration Scale Score 7.  Material enters airway, passes BELOW cords and not ejected out despite cough attempt by patient: Mildly thick liquids (Level 2, nectar thick) Compensatory Strategies: No data recorded  General Information: Caregiver present: No  Diet Prior to this Study: NPO; Cortrak/Small bore NG tube   Temperature : Normal   Respiratory Status: WFL   Supplemental O2: Nasal cannula   History of Recent Intubation: No  Behavior/Cognition: Lethargic/Drowsy; Requires cueing; Cooperative Self-Feeding Abilities: Dependent for feeding Baseline vocal quality/speech: Hypophonia/low volume No data recorded Volitional Swallow: Unable to elicit Exam Limitations: Other (comment) (lethargy, although this has been persistent this admission) Goal Planning: Prognosis for improved oropharyngeal function: Fair Barriers to Reach Goals: Cognitive deficits No  data recorded Patient/Family Stated Goal: family hopeful for improvement  Consulted and agree with results and recommendations: Pt unable/family or caregiver not available; Nurse; Advanced practice provider Pain: Pain Assessment Pain Assessment: Faces Faces Pain Scale: 0 Breathing: 0 Negative Vocalization: 0 Facial Expression: 0 Body Language: 0 Consolability: 0 PAINAD Score: 0 End of Session: Start Time:SLP Start Time (ACUTE ONLY): 0959 Stop Time: SLP Stop Time (ACUTE ONLY): 1020 Time Calculation:SLP Time Calculation (min) (ACUTE ONLY): 21 min Charges: SLP Evaluations $ SLP Speech Visit: 1 Visit SLP Evaluations $MBS Swallow: 1 Procedure $Swallowing Treatment: 1 Procedure SLP visit diagnosis: SLP Visit Diagnosis: Dysphagia, oropharyngeal phase (R13.12) Past Medical History: No past medical history on file. Past Surgical History: No past surgical history on file. [name]., M.A. CCC-SLP Acute Rehabilitation Services Office [phone] Secure chat preferred 05/30/2023, 12:49 PM     Medical Consultants:   None.   Subjective:    Jack [employer] sleeping this morning  Objective:    Vitals:   05/31/23 1540 05/31/23 2300 05/31/23 2301 06/01/23 0406  BP: 109/66 120/73 120/73 (!) 106/56  Pulse: 87 (!) 104 87 77  Resp: (!) 25 (!) 24 (!) 29 20  Temp: 98.6 F (37 C) 98 F (36.7 C) 98.2 F (36.8 C) 98 F (36.7 C)  TempSrc: Oral Axillary Oral Axillary  SpO2: 94% 96% 94% 94%  Weight:    63.5 kg  Height:       SpO2: 94 % O2 Flow Rate (L/min): 2 L/min   Intake/Output Summary (Last 24 hours) at 06/01/2023 0711 Last data filed at 06/01/2023 0410 Gross per 24 hour  Intake 511.18 ml  Output 1600 ml  Net -1088.82 ml   Filed Weights   05/30/23 0707 05/31/23 0500 06/01/23 0406  Weight: 65.9 kg 63.3 kg 63.5 kg    Exam: General exam: In no acute distress. Respiratory system: Good air movement and clear to auscultation. Cardiovascular system: S1 & S2 heard, RRR. No JVD. Gastrointestinal  system: Abdomen is nondistended, soft and nontender.  Extremities: No pedal edema. Skin: No rashes, lesions or ulcers  Data Reviewed:    Labs: Basic Metabolic Panel: Recent Labs  Lab 05/27/23 0509 05/29/23 0918 05/30/23 0733 05/31/23 0602  NA 136 136 137 135  K 4.2 4.5 4.4 4.2  CL 105 104 106 103  CO2 25 24 24 25   GLUCOSE 144* 173* 132* 121*  BUN 31* 35* 34* 32*  CREATININE 0.69 0.79 0.71 0.68  CALCIUM 8.6* 8.9 8.8* 8.8*   GFR Estimated Creatinine Clearance: 60.6 mL/min (by C-G formula based on SCr of 0.68 mg/dL). Liver Function Tests: Recent Labs  Lab 05/27/23 0509 05/29/23 0918 05/30/23 0733 05/31/23 0602  AST 22 29 22 18   ALT 20 29 24 21   ALKPHOS 54 51 50 45  BILITOT 0.3 0.6 0.5 0.6  PROT 6.5 6.6 6.3* 6.1*  ALBUMIN 2.6* 2.5* 2.3* 2.3*    No results for input(s): "LIPASE", "AMYLASE" in the last 168 hours. No results for input(s): "AMMONIA" in the last 168 hours. Coagulation profile No results for input(s): "INR", "PROTIME" in the last 168 hours.  COVID-19 Labs  No results for input(s): "DDIMER", "FERRITIN", "LDH", "CRP" in the last 72 hours.  Lab Results  Component Value Date   SARSCOV2NAA Not Detected 09/14/2018    CBC: Recent Labs  Lab 05/27/23 0509 05/29/23 0918 05/30/23 0733 05/31/23 0602  WBC 8.0 10.2 8.6 6.6  HGB 11.1* 11.2* 10.3* 10.2*  HCT 32.1* 32.5* 30.1* 29.3*  MCV 82.9 82.9 84.1 83.2  PLT 462* 443* 381 364   Cardiac  Enzymes: No results for input(s): "CKTOTAL", "CKMB", "CKMBINDEX", "TROPONINI" in the last 168 hours. BNP (last 3 results) No results for input(s): "PROBNP" in the last 8760 hours. CBG: Recent Labs  Lab 05/31/23 1144 05/31/23 1540 05/31/23 1942 05/31/23 2301 06/01/23 0403  GLUCAP 151* 139* 127* 128* 127*   D-Dimer: No results for input(s): "DDIMER" in the last 72 hours. Hgb A1c: No results for input(s): "HGBA1C" in the last 72 hours. Lipid Profile: No results for input(s): "CHOL", "HDL", "LDLCALC", "TRIG",  "CHOLHDL", "LDLDIRECT" in the last 72 hours. Thyroid function studies: No results for input(s): "TSH", "T4TOTAL", "T3FREE", "THYROIDAB" in the last 72 hours.  Invalid input(s): "FREET3" Anemia work up: No results for input(s): "VITAMINB12", "FOLATE", "FERRITIN", "TIBC", "IRON", "RETICCTPCT" in the last 72 hours. Sepsis Labs: Recent Labs  Lab 05/27/23 0509 05/29/23 0918 05/30/23 0733 05/31/23 0602  WBC 8.0 10.2 8.6 6.6   Microbiology No results found for this or any previous visit (from the past 240 hours).    Medications:    amLODipine  10 mg Per Tube Daily   feeding supplement (PROSource TF20)  60 mL Per Tube BID   fiber supplement (BANATROL TF)  60 mL Per Tube BID   free water  200 mL Per Tube Q4H   guaiFENesin  15 mL Per Tube Q6H   heparin injection (subcutaneous)  5,000 Units Subcutaneous Q12H   insulin aspart  0-9 Units Subcutaneous Q4H   multivitamin with minerals  1 tablet Per Tube Daily   mouth rinse  15 mL Mouth Rinse 4 times per day   pantoprazole (PROTONIX) IV  40 mg Intravenous QHS   Continuous Infusions:  ceFEPime (MAXIPIME) IV 2 g (06/01/23 0513)   feeding supplement (OSMOLITE 1.5 CAL) 1,000 mL (05/31/23 1748)   metronidazole 500 mg (05/31/23 2304)      LOS: 21 days   Marinda Elk  [facility]  06/01/2023, 7:11 AM

## 2023-06-02 DIAGNOSIS — F03A0 Unspecified dementia, mild, without behavioral disturbance, psychotic disturbance, mood disturbance, and anxiety: Secondary | ICD-10-CM | POA: Diagnosis not present

## 2023-06-02 DIAGNOSIS — Z515 Encounter for palliative care: Secondary | ICD-10-CM | POA: Diagnosis not present

## 2023-06-02 DIAGNOSIS — R1310 Dysphagia, unspecified: Secondary | ICD-10-CM | POA: Diagnosis not present

## 2023-06-02 DIAGNOSIS — Z7189 Other specified counseling: Secondary | ICD-10-CM | POA: Diagnosis not present

## 2023-06-02 DIAGNOSIS — I6389 Other cerebral infarction: Secondary | ICD-10-CM | POA: Diagnosis not present

## 2023-06-02 DIAGNOSIS — E43 Unspecified severe protein-calorie malnutrition: Secondary | ICD-10-CM | POA: Diagnosis not present

## 2023-06-02 LAB — GLUCOSE, CAPILLARY
Glucose-Capillary: 124 mg/dL — ABNORMAL HIGH (ref 70–99)
Glucose-Capillary: 112 mg/dL — ABNORMAL HIGH (ref 70–99)
Glucose-Capillary: 129 mg/dL — ABNORMAL HIGH (ref 70–99)
Glucose-Capillary: 113 mg/dL — ABNORMAL HIGH (ref 70–99)
Glucose-Capillary: 124 mg/dL — ABNORMAL HIGH (ref 70–99)
Glucose-Capillary: 106 mg/dL — ABNORMAL HIGH (ref 70–99)

## 2023-06-02 MED ORDER — METRONIDAZOLE 500 MG/100ML IV SOLN
500.0000 mg | Freq: Two times a day (BID) | INTRAVENOUS | Status: AC
  Administered 2023-06-02: 500 mg via INTRAVENOUS
  Filled 2023-06-02: qty 100

## 2023-06-02 MED ORDER — SODIUM CHLORIDE 0.9 % IV SOLN
2.0000 g | Freq: Three times a day (TID) | INTRAVENOUS | Status: AC
  Administered 2023-06-02: 2 g via INTRAVENOUS
  Filled 2023-06-02: qty 12.5

## 2023-06-02 NOTE — Evaluation (Addendum)
 Clinical/Bedside Swallow Evaluation Patient Details  Name: Derek Yates MRN: [id number] Date of Birth: 29-Sep-1940  Today's Date: 06/02/2023 Time: SLP Start Time (ACUTE ONLY): 1043 SLP Stop Time (ACUTE ONLY): 1051 SLP Time Calculation (min) (ACUTE ONLY): 8 min  Past Medical History: History reviewed. No pertinent past medical history. Past Surgical History: History reviewed. No pertinent surgical history. HPI:  Patient is an 83 y.o. male with PMH: dementia, recurrent UTI's, CVA, HTN. He presented to the hospital on 05/11/23 as a code stroke with speech deficits and left sided weakness. 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 Patient has been NPO; Cortrak placed 2/21. He failed [facility] swallow screen with RN.    Assessment / Plan / Recommendation  Clinical Impression  Pt is known to this SLP from prior swallowing evaluation during this admission with a significant decline in function since that encounter. He is lethargic, only rousing for seconds at a time. Pt does not follow commands. He is not suspected to be managing his secretions adequately. Pt's lethargy has persisted since initial evaluation 2/22, but per SLP note 3/6, pt was accepting of and initiating a swallow with POs, although with overt s/s of aspiration. An MBS was attempted 3/10 with no observable oral transit or swallow initiation, allowing liquids to spill into his pharynx and larynx. Today, he coughed once prior to POs but continues to be unable to expectorate secretions orally. Pt sealed his lips around one tspn of water without subsequently initiating a swallow response as no palpable hyolaryngeal movement was observed. Pt's mentation does not currently support consideration of a safe PO diet and is not anticipated to allow initiation of a PO diet in the short term.  SLP Visit Diagnosis: Dysphagia, oropharyngeal  phase (R13.12)    Aspiration Risk  Risk for inadequate nutrition/hydration;Severe aspiration risk    Diet Recommendation NPO    Medication Administration: Via alternative means    Other  Recommendations Oral Care Recommendations: Oral care QID;Staff/trained caregiver to provide oral care    Recommendations for follow up therapy are one component of a multi-disciplinary discharge planning process, led by the attending physician.  Recommendations may be updated based on patient status, additional functional criteria and insurance authorization.  Follow up Recommendations Skilled nursing-short term rehab (<3 hours/day)      Assistance Recommended at Discharge    Functional Status Assessment Patient has had a recent decline in their functional status and demonstrates the ability to make significant improvements in function in a reasonable and predictable amount of time.  Frequency and Duration min 2x/week  2 weeks       Prognosis Prognosis for improved oropharyngeal function: Guarded Barriers to Reach Goals: Cognitive deficits;Time post onset;Severity of deficits      Swallow Study   General HPI: Patient is an 83 y.o. male with PMH: dementia, recurrent UTI's, CVA, HTN. He presented to the hospital on 05/11/23 as a code stroke with speech deficits and left sided weakness. 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 Patient has been NPO; Cortrak placed 2/21. He failed [facility] swallow screen with RN. Type of Study: Bedside Swallow Evaluation Diet Prior to this Study: NPO;Cortrak/Small bore NG tube Temperature Spikes Noted: Yes Respiratory Status: Nasal cannula History of Recent Intubation: No Behavior/Cognition: Lethargic/Drowsy;Requires cueing Oral Cavity Assessment: Dry Oral Care  Completed by SLP: Yes Oral Cavity - Dentition: Poor condition;Missing dentition Vision:  Functional for self-feeding Self-Feeding Abilities: Total assist Patient Positioning: Upright in bed Baseline Vocal Quality: Not observed Volitional Cough: Cognitively unable to elicit Volitional Swallow: Unable to elicit    Oral/Motor/Sensory Function Overall Oral Motor/Sensory Function: Generalized oral weakness   Ice Chips Ice chips: Not tested   Thin Liquid Thin Liquid: Impaired Presentation: Spoon Oral Phase Impairments: Reduced labial seal;Reduced lingual movement/coordination;Poor awareness of bolus Oral Phase Functional Implications: Right anterior spillage Pharyngeal  Phase Impairments: Decreased hyoid-laryngeal movement;Unable to trigger swallow    Nectar Thick Nectar Thick Liquid: Not tested   Honey Thick Honey Thick Liquid: Not tested   Puree Puree: Not tested   Solid     Solid: Not tested      Gwynneth Aliment, M.A., CF-SLP Speech Language Pathology, Acute Rehabilitation Services  Secure Chat preferred [phone]  06/02/2023,11:13 AM

## 2023-06-02 NOTE — Progress Notes (Signed)
 Daily Progress Note   Patient Name: Derek Yates       Date: 06/02/2023 DOB: 18-Mar-1941  Age: 83 y.o. MRN#: [id number] Attending Physician: Marinda Elk, MD Primary Care Physician: [name], MD Admit Date: 05/11/2023  Reason for Consultation/Follow-up: Establishing goals of care  Patient Profile/HPI:   83 y.o. 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 Hospitalization complicated by aspiration pneumonia as well as possible neurogenic fever. Cortrak in place.   Subjective: Chart reviewed including labs, progress notes, imaging from this and previous encounters.  I reviewed MBS which shows patient did not initiate any swallowing.  On evaluation- patient was sleeping- did not arouse to my voice- no family at bedside.  Received a message to call patient's son. After visiting the hospice facility and talking with his family and considering their faith based values and goals- they have decided to proceed with PEG tube and pursue LTAC.  Last night Derek Yates was more awake and talking with moments of clarity- he inquired about his wife's knee, although he did have intermittent periods of nonsensical speech We discussed his concerns for his mom's frailty- she is spending every night at the hospital and he wishes she would go home and rest- requested providers encourage her own self care.  I offered support for patient and inquired how we could facilitate his spiritual wellness during Ramadan- Derek Yates notes his moms is playing prayers for him on an app. I encourage him to let us know of any  other ways we can provide the best care for his Dad.    Review of Systems  Unable to perform ROS: Mental status change     Physical Exam Vitals and nursing note reviewed.  Cardiovascular:     Rate and Rhythm: Normal rate.  Neurological:     Comments: sleeping             Vital Signs: BP 103/70 (BP Location: Right Arm)   Pulse 84   Temp 98.1 F (36.7 C) (Axillary)   Resp (!) 23   Ht 5\' 8"  (1.727 m)   Wt 63.7 kg   SpO2 94%   BMI 21.35 kg/m  SpO2:  SpO2: 94 % O2 Device: O2 Device: Room Air O2 Flow Rate: O2 Flow Rate (L/min): 2 L/min  Intake/output summary:  Intake/Output Summary (Last 24 hours) at 06/02/2023 1029 Last data filed at 06/02/2023 0500 Gross per 24 hour  Intake 2279.33 ml  Output 2450 ml  Net -170.67 ml   LBM: Last BM Date : 06/01/23 Baseline Weight: Weight: 61.9 kg Most recent weight: Weight: 63.7 kg       Palliative Assessment/Data: PPS: 10% (cortrak in)      Patient Active Problem List   Diagnosis Date Noted   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) 05/15/2023   Protein-calorie malnutrition, severe 05/13/2023   Stroke (cerebrum) (HCC) 05/11/2023   Acute cough 04/21/2023   Other abnormal findings in urine 04/21/2023   Mild dementia without behavioral disturbance, psychotic disturbance, mood disturbance, or anxiety (HCC) 02/14/2023   Onychomycosis 08/17/2022   Gait abnormality 08/17/2022   Mixed hyperlipidemia 06/01/2022   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 (HCC) 06/01/2022    Palliative Care Assessment & Plan    Assessment/Recommendations/Plan  Continue current care Family elects for PEG placement and interested in eval for LTAC   Code Status:   Code Status: Limited: Do not attempt resuscitation (DNR) -DNR-LIMITED -Do Not Intubate/DNI    Prognosis:  Unable to determine  Discharge Planning: To Be Determined  Care plan was discussed with patient's son, bedside RN and  attending provider.   Thank you for allowing the Palliative Medicine Team to assist in the care of this patient.  Total time:  70 minutes Prolonged billing:  Time includes:   Preparing to see the patient (e.g., review of tests) Obtaining and/or reviewing separately obtained history Performing a medically necessary appropriate examination and/or evaluation Counseling and educating the patient/family/caregiver Ordering medications, tests, or procedures Referring and communicating with other health care professionals (when not reported separately) Documenting clinical information in the electronic or other health record Independently interpreting results (not reported separately) and communicating results to the patient/family/caregiver Care coordination (not reported separately) Clinical documentation  Ocie Bob, AGNP-C Palliative Medicine   Please contact Palliative Medicine Team phone at [phone] for questions and concerns.

## 2023-06-02 NOTE — TOC Progression Note (Addendum)
 Transition of Care [facility]) - Progression Note    Patient Details  Name: Derek Yates MRN: [id number] Date of Birth: [birth year]  Transition of Care [facility]) CM/SW Contact  Lockie Pares, RN Phone Number: 06/02/2023, 9:31 AM  Clinical Narrative:     Message with Provider. Patient family now wish to proceed with aggressive care LTAC vs SNF. [facility] both [facility] to review for potential LTAC.Patient will need a PEG tube, as he still has a coretrak in . Spoke to Son [employer] and that [facility] and [facility] will likely be calling him, discussed process and PEG tube.  His preference is [facility], but he will talk to both agencies. Son would prefer [facility] declined due to need. Awaiting [facility] 14[facility] Have not heard form [facility], messaged [facility] to ask where the referral is in progress, they answered back they can accept and will start authorization. Son chose [facility].    Expected Discharge Plan: Skilled Nursing Facility Barriers to Discharge: Continued Medical Work up, SNF Pending bed offer, English as a second language teacher  Expected Discharge Plan and Services   Discharge Planning Services: CM Consult Post Acute Care Choice: Hospice Living arrangements for the past 2 months: Single Family Home                             [facility] Agency: [facility] Date [facility] Agency Contacted: 05/31/23   Representative spoke with at [facility] Agency: Efraim Kaufmann   Social Determinants of Health (SDOH) Interventions SDOH Screenings   Food Insecurity: Patient Unable To Answer (05/13/2023)  Housing: Patient Unable To Answer (05/13/2023)  Transportation Needs: Patient Unable To Answer (05/13/2023)  Utilities: Patient Unable To Answer (05/13/2023)  Depression (PHQ2-9): Medium Risk (02/10/2023)  Financial Resource Strain: Low Risk  (06/20/2021)   Received from [facility], [facility]  Social Connections: Patient Unable To Answer (05/13/2023)  Tobacco Use: Low Risk   (05/31/2023)    Readmission Risk Interventions     No data to display

## 2023-06-02 NOTE — Progress Notes (Signed)
 PT Cancellation Note  Patient Details Name: Derek Yates MRN: [id number] DOB: May 16, 1940   Cancelled Treatment:    Reason Eval/Treat Not Completed: Other (comment) Noted plans to transition to full comfort care in the coming days with potential transfer to hospice. Will sign off at this time. Please re-consult if needs change.  Lillia Pauls, PT, DPT Acute Rehabilitation Services Office [phone]    [name] 06/02/2023, 8:00 AM

## 2023-06-02 NOTE — Progress Notes (Signed)
 SLP Cancellation Note  Patient Details Name: Derek Yates MRN: [id number] DOB: [birth year]   Cancelled treatment:       Reason Eval/Treat Not Completed: Other (comment) SLP reviewed palliative note from 3/12 stating plan for d/c Cortrak and transition to full comfort care in the coming days. Education regarding patient's MBS and decline in swallow function completed by SLP with patient's son on 05/31/23. SLP will respectfully s/o at this time.  Angela Nevin, MA, CCC-SLP Speech Therapy

## 2023-06-02 NOTE — Progress Notes (Signed)
 [facility] PROGRESS NOTE    Progress Note  Derek Yates  ZOX:[id number] DOB: 1940-12-29 DOA: 05/11/2023 PCP: [name], MD     Brief Narrative:   Derek Yates is an 83 y.o. male past medical history significant for essential hypertension, CVA recurrent UTI and dementia presented to [facility] with code stroke CT of the head showed intraparenchymal hemorrhage to the right caudate extending with intraventricular lesions, CCM consulted on 05/15/2023 for fevers for 48 hours, thought to be related to aspiration pneumonia transferred to CCM on 05/17/2023. 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  After family went to residential hospice facility he decided against it. Family would like aggressive care either to proceed to [facility] or skilled nursing facility. I have discussed with the family that we need to transition to a PEG tube. Patient is DNR/DNI.  Recurrent aspiration pneumonia: CT chest on 05/17/2023 showed lower lobes multifocal mucous plugging and patchy airspace infiltrate concerning for aspiration pneumonia. Has remained afebrile. Has completed his course of antibiotics on 06/02/2023  Acute respiratory failure with hypoxia due to aspiration pneumonia: Now been weaned to room air. Continue chest physiotherapy and inhalers.  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 Continue statins.  Acute kidney injury: Likely hemodynamic mediated resolved with IV fluid resuscitation.  Essential hypertension: Pressures well-controlled off antihypertensive medication.  Hyperlipidemia: Continue statins.  Dysphagia: Currently n.p.o. with a core track. Speech to evaluate.  Advance dementia: Severe memory loss  Protein-calorie malnutrition, severe Noted.   DVT prophylaxis:  SCD Family Communication:none Status is: Inpatient Remains inpatient appropriate because: Acute intracranial hemorrhage, okay to transfer to the floor    Code Status:     Code Status Orders  (From admission, onward)           Start     Ordered   05/17/23 1008  Do not attempt resuscitation (DNR)- Limited -Do Not Intubate (DNI)  (Code Status)  Continuous       Question Answer Comment  If pulseless and not breathing No CPR or chest compressions.   In Pre-Arrest Conditions (Patient Is Breathing and Has A Pulse) Do not intubate. Provide all appropriate non-invasive medical interventions. Avoid ICU transfer unless indicated or required.   Consent: Discussion documented in EHR or advanced directives reviewed      05/17/23 1008           Code Status History     Date Active Date Inactive Code Status Order ID Comments User Context   05/11/2023 2022 05/17/2023 1008 Full Code [id number]  [name], MD Inpatient         IV Access:   Peripheral IV   Procedures and diagnostic studies:   No results found.     Medical Consultants:   None.   Subjective:    Derek Yates awake this morning has no pain  Objective:    Vitals:   06/01/23 2010 06/01/23 2332 06/02/23 0406 06/02/23 0808  BP: 138/70 (!) 98/57 (!) 97/57 103/70  Pulse: 93 100 81 84  Resp: 20 20 20  (!) 23  Temp: 98 F (36.7 C) 98.8 F (37.1 C) 97.8 F (36.6 C) 98.1 F (36.7 C)  TempSrc: Oral Axillary Axillary Axillary  SpO2: 95% 93% 96% 94%  Weight:   63.7 kg   Height:  SpO2: 94 % O2 Flow Rate (L/min): 2 L/min   Intake/Output Summary (Last 24 hours) at 06/02/2023 0849 Last data filed at 06/02/2023 0500 Gross per 24 hour  Intake 2479.33 ml  Output 2450 ml  Net 29.33 ml   Filed Weights   05/31/23 0500 06/01/23 0406 06/02/23 0406  Weight: 63.3 kg 63.5 kg 63.7 kg    Exam: General exam: In no acute distress. Respiratory system: Good air movement and clear to  auscultation. Cardiovascular system: S1 & S2 heard, RRR. No JVD. Gastrointestinal system: Abdomen is nondistended, soft and nontender.  Extremities: No pedal edema. Skin: No rashes, lesions or ulcers Psychiatry: Judgement and insight appear normal. Mood & affect appropriate.  Data Reviewed:    Labs: Basic Metabolic Panel: Recent Labs  Lab 05/27/23 0509 05/29/23 0918 05/30/23 0733 05/31/23 0602  NA 136 136 137 135  K 4.2 4.5 4.4 4.2  CL 105 104 106 103  CO2 25 24 24 25   GLUCOSE 144* 173* 132* 121*  BUN 31* 35* 34* 32*  CREATININE 0.69 0.79 0.71 0.68  CALCIUM 8.6* 8.9 8.8* 8.8*   GFR Estimated Creatinine Clearance: 60.8 mL/min (by C-G formula based on SCr of 0.68 mg/dL). Liver Function Tests: Recent Labs  Lab 05/27/23 0509 05/29/23 0918 05/30/23 0733 05/31/23 0602  AST 22 29 22 18   ALT 20 29 24 21   ALKPHOS 54 51 50 45  BILITOT 0.3 0.6 0.5 0.6  PROT 6.5 6.6 6.3* 6.1*  ALBUMIN 2.6* 2.5* 2.3* 2.3*    No results for input(s): "LIPASE", "AMYLASE" in the last 168 hours. No results for input(s): "AMMONIA" in the last 168 hours. Coagulation profile No results for input(s): "INR", "PROTIME" in the last 168 hours.  COVID-19 Labs  No results for input(s): "DDIMER", "FERRITIN", "LDH", "CRP" in the last 72 hours.  Lab Results  Component Value Date   SARSCOV2NAA Not Detected 09/14/2018    CBC: Recent Labs  Lab 05/27/23 0509 05/29/23 0918 05/30/23 0733 05/31/23 0602  WBC 8.0 10.2 8.6 6.6  HGB 11.1* 11.2* 10.3* 10.2*  HCT 32.1* 32.5* 30.1* 29.3*  MCV 82.9 82.9 84.1 83.2  PLT 462* 443* 381 364   Cardiac Enzymes: No results for input(s): "CKTOTAL", "CKMB", "CKMBINDEX", "TROPONINI" in the last 168 hours. BNP (last 3 results) No results for input(s): "PROBNP" in the last 8760 hours. CBG: Recent Labs  Lab 06/01/23 1554 06/01/23 2009 06/01/23 2331 06/02/23 0405 06/02/23 0805  GLUCAP 135* 122* 129* 124* 112*   D-Dimer: No results for input(s): "DDIMER" in  the last 72 hours. Hgb A1c: No results for input(s): "HGBA1C" in the last 72 hours. Lipid Profile: No results for input(s): "CHOL", "HDL", "LDLCALC", "TRIG", "CHOLHDL", "LDLDIRECT" in the last 72 hours. Thyroid function studies: No results for input(s): "TSH", "T4TOTAL", "T3FREE", "THYROIDAB" in the last 72 hours.  Invalid input(s): "FREET3" Anemia work up: No results for input(s): "VITAMINB12", "FOLATE", "FERRITIN", "TIBC", "IRON", "RETICCTPCT" in the last 72 hours. Sepsis Labs: Recent Labs  Lab 05/27/23 0509 05/29/23 0918 05/30/23 0733 05/31/23 0602  WBC 8.0 10.2 8.6 6.6   Microbiology No results found for this or any previous visit (from the past 240 hours).    Medications:    amLODipine  10 mg Per Tube Daily   feeding supplement (PROSource TF20)  60 mL Per Tube BID   fiber supplement (BANATROL TF)  60 mL Per Tube BID   free water  200 mL Per Tube Q4H   guaiFENesin  15 mL Per Tube Q6H  heparin injection (subcutaneous)  5,000 Units Subcutaneous Q12H   insulin aspart  0-9 Units Subcutaneous Q4H   multivitamin with minerals  1 tablet Per Tube Daily   mouth rinse  15 mL Mouth Rinse 4 times per day   pantoprazole (PROTONIX) IV  40 mg Intravenous QHS   Continuous Infusions:  ceFEPime (MAXIPIME) IV 2 g (06/02/23 0615)   feeding supplement (OSMOLITE 1.5 CAL) 40 mL/hr at 06/01/23 1648   metronidazole 500 mg (06/01/23 2335)      LOS: 22 days   Marinda Elk  [facility]  06/02/2023, 8:49 AM

## 2023-06-03 ENCOUNTER — Inpatient Hospital Stay (HOSPITAL_COMMUNITY)

## 2023-06-03 DIAGNOSIS — R1319 Other dysphagia: Secondary | ICD-10-CM

## 2023-06-03 DIAGNOSIS — I6389 Other cerebral infarction: Secondary | ICD-10-CM | POA: Diagnosis not present

## 2023-06-03 DIAGNOSIS — J690 Pneumonitis due to inhalation of food and vomit: Secondary | ICD-10-CM | POA: Diagnosis not present

## 2023-06-03 DIAGNOSIS — F03A0 Unspecified dementia, mild, without behavioral disturbance, psychotic disturbance, mood disturbance, and anxiety: Secondary | ICD-10-CM | POA: Diagnosis not present

## 2023-06-03 LAB — GLUCOSE, CAPILLARY
Glucose-Capillary: 126 mg/dL — ABNORMAL HIGH (ref 70–99)
Glucose-Capillary: 144 mg/dL — ABNORMAL HIGH (ref 70–99)
Glucose-Capillary: 117 mg/dL — ABNORMAL HIGH (ref 70–99)
Glucose-Capillary: 137 mg/dL — ABNORMAL HIGH (ref 70–99)
Glucose-Capillary: 112 mg/dL — ABNORMAL HIGH (ref 70–99)
Glucose-Capillary: 113 mg/dL — ABNORMAL HIGH (ref 70–99)

## 2023-06-03 NOTE — Plan of Care (Signed)
  Problem: Education: Goal: Knowledge of General Education information will improve Description: Including pain rating scale, medication(s)/side effects and non-pharmacologic comfort measures 06/03/2023 0624 by Charmian Muff, RN Outcome: Progressing 06/03/2023 0406 by Charmian Muff, RN Outcome: Progressing   Problem: Health Behavior/Discharge Planning: Goal: Ability to manage health-related needs will improve 06/03/2023 0624 by Charmian Muff, RN Outcome: Progressing 06/03/2023 0406 by Charmian Muff, RN Outcome: Progressing   Problem: Clinical Measurements: Goal: Ability to maintain clinical measurements within normal limits will improve 06/03/2023 0624 by Charmian Muff, RN Outcome: Progressing 06/03/2023 0406 by Charmian Muff, RN Outcome: Progressing Goal: Will remain free from infection 06/03/2023 0624 by Charmian Muff, RN Outcome: Progressing 06/03/2023 0406 by Charmian Muff, RN Outcome: Not Progressing

## 2023-06-03 NOTE — Progress Notes (Addendum)
 Nutrition Follow-up  DOCUMENTATION CODES:   Severe malnutrition in context of chronic illness  INTERVENTION:  Continue tube feeding via Cortrak tube: Osmolite 1.5 at 40 ml/h (960 ml per day) Prosource TF20 60 ml BID FWF added per MD - Q4 hours   Provides 1600 kcal, 100 gm protein, 1929 ml free water daily   MVI with minerals daily via tube  Banatrol BID, each packet provides 5g soluble fiber Monitor for PEG tube placement and adjust feeding schedule, if indicated   NUTRITION DIAGNOSIS:  Severe Malnutrition related to chronic illness as evidenced by severe fat depletion, severe muscle depletion. - remains applicable  GOAL:  Patient will meet greater than or equal to 90% of their needs - being met via tube feedings  MONITOR:  Diet advancement, TF tolerance, Labs  REASON FOR ASSESSMENT:  Consult Enteral/tube feeding initiation and management  ASSESSMENT:   Pt with PMH of HTN, CVA, recurrent UTIs, and dementia admitted with acute R ICH with IVH likely due to advanced CAA.  2/19 admitted w/ R caudate bleed 2/21 - s/p cortrak placement; tip gastric  2/22 - bedside swallow: NPO, CT head, CXR 2/25 - CT of chest: PNA 2/26 - transfer to 4NP 2/28 - FMS and flatus pouch removed 3/7 - unable to complete MBSS 3/10- MBSS: decline in function 3/11 - swallow edu completed w/ patient's son 3/12 - referral made to residential hospice 3/13 - family would like to pursue PEG. LTACH placement 3/14 - IR: pt not candidate for PEG  Pt continues with little to no progress toward functional goals. No longer initiating swallow, per MBS performed on 3/10. Family has toured residential hospice facility and have elected for aggressive care and, therefore, PEG tube placement. Looking to d/c to [facility].   Consult submitted to IR who is stating pt not a candidate for PEG tube placement. Surgical consult to be submitted. Family preferring to hold off on insurance auth for [facility] until PEG has been  placed.  Admit Weight: 61.9kg Current Weight: 63.7kg Lowest Weight: 58.8kg on 2/25   Tube feeds continue at goal rate. Appears to be meeting his needs AEB wt trend and observed degree of muscle and fat wasting. Abdomen soft. Skin integrity stable/desirable. BM today. Positive for flatus and bowel sounds. Very mild edema to BLEs.    Meds: Banatrol, SSI, MVI, pantoprazole   Labs: no new labs  Diet Order:   Diet Order             Diet NPO time specified Except for: Other (See Comments)  Diet effective now            EDUCATION NEEDS:  Not appropriate for education at this time  Skin:  Skin Assessment: Reviewed RN Assessment  Last BM:  3/14 - type 6  Height:  Ht Readings from Last 1 Encounters:  05/31/23 5\' 8"  (1.727 m)   Weight:  Wt Readings from Last 1 Encounters:  06/02/23 63.7 kg   Ideal Body Weight:  70 kg  BMI:  Body mass index is 21.35 kg/m.  Estimated Nutritional Needs:   Kcal:  1600-1800  Protein:  90-100 grams  Fluid:  >1.6 L/day  Myrtie Cruise MS, RD, LDN Registered Dietitian Clinical Nutrition RD Inpatient Contact Info in Amion

## 2023-06-03 NOTE — Plan of Care (Signed)
   Problem: Education: Goal: Knowledge of General Education information will improve Description: Including pain rating scale, medication(s)/side effects and non-pharmacologic comfort measures Outcome: Progressing   Problem: Health Behavior/Discharge Planning: Goal: Ability to manage health-related needs will improve Outcome: Progressing   Problem: Clinical Measurements: Goal: Diagnostic test results will improve Outcome: Progressing

## 2023-06-03 NOTE — Progress Notes (Signed)
 [facility] PROGRESS NOTE    Progress Note  Derek Yates  UXL:[id number] DOB: [birth year] DOA: 05/11/2023 PCP: [name], MD     Brief Narrative:   Derek Yates is an 83 y.o. male past medical history significant for essential hypertension, CVA recurrent UTI and dementia presented to [facility] with code stroke CT of the head showed intraparenchymal hemorrhage to the right caudate extending with intraventricular lesions, CCM consulted on 05/15/2023 for fevers for 48 hours, thought to be related to aspiration pneumonia transferred to CCM on 05/17/2023. 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  After family went to residential hospice facility he decided against it. Family would like aggressive care either to proceed to [facility] or skilled nursing facility. Consult IR for PEG tube placement. Patient is DNR/DNI. He will see consulted for LTAC versus skilled nursing facility placement.  Recurrent aspiration pneumonia: CT chest on 05/17/2023 pneumonia. Has remained afebrile. Has completed his course of antibiotics on 06/02/2023  Acute respiratory failure with hypoxia due to aspiration pneumonia: Now been weaned to room air. Continue chest physiotherapy and inhalers.  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 Continue statins.  Acute kidney injury: Likely hemodynamic mediated resolved with IV fluid resuscitation.  Essential hypertension: Pressures well-controlled off antihypertensive medication.  Hyperlipidemia: Continue statins.  Dysphagia: Currently n.p.o. with a core track. Speech to evaluate.  Advance dementia: Severe memory loss  Protein-calorie malnutrition, severe Noted.   DVT prophylaxis: SCD Family Communication:none Status is: Inpatient Remains  inpatient appropriate because: Acute intracranial hemorrhage, okay to transfer to the floor    Code Status:     Code Status Orders  (From admission, onward)           Start     Ordered   05/17/23 1008  Do not attempt resuscitation (DNR)- Limited -Do Not Intubate (DNI)  (Code Status)  Continuous       Question Answer Comment  If pulseless and not breathing No CPR or chest compressions.   In Pre-Arrest Conditions (Patient Is Breathing and Has A Pulse) Do not intubate. Provide all appropriate non-invasive medical interventions. Avoid ICU transfer unless indicated or required.   Consent: Discussion documented in EHR or advanced directives reviewed      05/17/23 1008           Code Status History     Date Active Date Inactive Code Status Order ID Comments User Context   05/11/2023 2022 05/17/2023 1008 Full Code [id number]  [name], MD Inpatient         IV Access:   Peripheral IV   Procedures and diagnostic studies:   No results found.     Medical Consultants:   None.   Subjective:    Derek Yates no pain this morning  Objective:    Vitals:   06/02/23 1950 06/02/23 2326 06/03/23 0344 06/03/23 0823  BP: 108/61 101/62 104/65 110/60  Pulse: 87 90 81 77  Resp: 20 (!) 22 20 (!) 24  Temp: 97.7 F (36.5 C) 98.5 F (36.9 C) 98.5 F (36.9 C) 97.8 F (36.6 C)  TempSrc: Axillary Axillary Axillary Axillary  SpO2: 93% 97% 96% 97%  Weight:      Height:       SpO2: 97 % O2 Flow Rate (L/min): 2 L/min  Intake/Output Summary (Last 24 hours) at 06/03/2023 0834 Last data filed at 06/03/2023 0345 Gross per 24 hour  Intake 700 ml  Output 2350 ml  Net -1650 ml   Filed Weights   05/31/23 0500 06/01/23 0406 06/02/23 0406  Weight: 63.3 kg 63.5 kg 63.7 kg    Exam: General exam: In no acute distress. Respiratory system: Good air movement and clear to auscultation. Cardiovascular system: S1 & S2 heard, RRR. No JVD. Gastrointestinal system:  Abdomen is nondistended, soft and nontender.  Extremities: No pedal edema. Skin: No rashes, lesions or ulcers  Data Reviewed:    Labs: Basic Metabolic Panel: Recent Labs  Lab 05/29/23 0918 05/30/23 0733 05/31/23 0602  NA 136 137 135  K 4.5 4.4 4.2  CL 104 106 103  CO2 24 24 25   GLUCOSE 173* 132* 121*  BUN 35* 34* 32*  CREATININE 0.79 0.71 0.68  CALCIUM 8.9 8.8* 8.8*   GFR Estimated Creatinine Clearance: 60.8 mL/min (by C-G formula based on SCr of 0.68 mg/dL). Liver Function Tests: Recent Labs  Lab 05/29/23 0918 05/30/23 0733 05/31/23 0602  AST 29 22 18   ALT 29 24 21   ALKPHOS 51 50 45  BILITOT 0.6 0.5 0.6  PROT 6.6 6.3* 6.1*  ALBUMIN 2.5* 2.3* 2.3*    No results for input(s): "LIPASE", "AMYLASE" in the last 168 hours. No results for input(s): "AMMONIA" in the last 168 hours. Coagulation profile No results for input(s): "INR", "PROTIME" in the last 168 hours.  COVID-19 Labs  No results for input(s): "DDIMER", "FERRITIN", "LDH", "CRP" in the last 72 hours.  Lab Results  Component Value Date   SARSCOV2NAA Not Detected 09/14/2018    CBC: Recent Labs  Lab 05/29/23 0918 05/30/23 0733 05/31/23 0602  WBC 10.2 8.6 6.6  HGB 11.2* 10.3* 10.2*  HCT 32.5* 30.1* 29.3*  MCV 82.9 84.1 83.2  PLT 443* 381 364   Cardiac Enzymes: No results for input(s): "CKTOTAL", "CKMB", "CKMBINDEX", "TROPONINI" in the last 168 hours. BNP (last 3 results) No results for input(s): "PROBNP" in the last 8760 hours. CBG: Recent Labs  Lab 06/02/23 1531 06/02/23 1949 06/02/23 2324 06/03/23 0342 06/03/23 0822  GLUCAP 113* 124* 106* 126* 144*   D-Dimer: No results for input(s): "DDIMER" in the last 72 hours. Hgb A1c: No results for input(s): "HGBA1C" in the last 72 hours. Lipid Profile: No results for input(s): "CHOL", "HDL", "LDLCALC", "TRIG", "CHOLHDL", "LDLDIRECT" in the last 72 hours. Thyroid function studies: No results for input(s): "TSH", "T4TOTAL", "T3FREE",  "THYROIDAB" in the last 72 hours.  Invalid input(s): "FREET3" Anemia work up: No results for input(s): "VITAMINB12", "FOLATE", "FERRITIN", "TIBC", "IRON", "RETICCTPCT" in the last 72 hours. Sepsis Labs: Recent Labs  Lab 05/29/23 0918 05/30/23 0733 05/31/23 0602  WBC 10.2 8.6 6.6   Microbiology No results found for this or any previous visit (from the past 240 hours).    Medications:    amLODipine  10 mg Per Tube Daily   feeding supplement (PROSource TF20)  60 mL Per Tube BID   fiber supplement (BANATROL TF)  60 mL Per Tube BID   free water  200 mL Per Tube Q4H   guaiFENesin  15 mL Per Tube Q6H   heparin injection (subcutaneous)  5,000 Units Subcutaneous Q12H   insulin aspart  0-9 Units Subcutaneous Q4H   multivitamin with minerals  1 tablet Per Tube Daily   mouth rinse  15 mL Mouth Rinse 4 times per day   pantoprazole (PROTONIX) IV  40 mg  Intravenous QHS   Continuous Infusions:  feeding supplement (OSMOLITE 1.5 CAL) 1,000 mL (06/02/23 1601)      LOS: 23 days   Marinda Elk  [facility]  06/03/2023, 8:34 AM

## 2023-06-03 NOTE — TOC Progression Note (Signed)
 Transition of Care (TOC) - Progression Note  Donn Pierini RN,BSN Transitions of Care Unit 4NP (Non Trauma)- RN Case Manager See Treatment Team for direct Phone #   Patient Details  Name: Derek Yates MRN: [id number] Date of Birth: [birth year]  Transition of Care [facility]) CM/SW Contact  Zenda Alpers Lenn Sink, RN Phone Number: 06/03/2023, 1:56 PM  Clinical Narrative:    CM received TC from son, regarding LTACH request to [facility]. Son voiced that he had spoken with both [facility] liaisons yesterday- family prefers [facility]. CM has been updated this am that [facility] has started insurance auth this am. CM confirmed this with son. Son also asking about PEG placement- CM discussed w/ son that even if pt does not get insurance approval for [facility] that he would need PEG for any disposition such as SNF or Home. Son confirmed he would prefer to wait on PEG tube placement until they hear if insurance has approved [facility].   Msg sent to team (MD/[facility] liaison and bedside RNs).   1400- [facility] liaison has spoken with son regarding pending insurance which they do not expect to hear anything until next week and PEG placement- per [place] with [facility] son is now agreeable to PEG placement and would like to hold on submitting auth to insurance for [facility] until after PEG has been placed- attending MD updated per [facility] liaison.    Expected Discharge Plan: Skilled Nursing Facility Barriers to Discharge: Continued Medical Work up, SNF Pending bed offer, English as a second language teacher  Expected Discharge Plan and Services   Discharge Planning Services: CM Consult Post Acute Care Choice: Hospice Living arrangements for the past 2 months: Single Family Home                             [facility] Agency: [facility] Date [facility] Agency Contacted: 05/31/23   Representative spoke with at [facility] Agency: Efraim Kaufmann   Social Determinants of Health (SDOH) Interventions SDOH Screenings   Food Insecurity:  Patient Unable To Answer (05/13/2023)  Housing: Patient Unable To Answer (05/13/2023)  Transportation Needs: Patient Unable To Answer (05/13/2023)  Utilities: Patient Unable To Answer (05/13/2023)  Depression (PHQ2-9): Medium Risk (02/10/2023)  Financial Resource Strain: Low Risk  (06/20/2021)   Received from [facility]  Social Connections: Patient Unable To Answer (05/13/2023)  Tobacco Use: Low Risk  (05/31/2023)    Readmission Risk Interventions     No data to display

## 2023-06-03 NOTE — Progress Notes (Signed)
 Request to IR for possible percutaneous gastrostomy placement. CT abd w/o contrast obtained today which showed:  1. Anatomy is not amenable to percutaneous gastrostomy tube placement. There is interposition of the splenic flexure of the colon and transverse colon between the stomach and gastric wall with no percutaneous window for gastrostomy tube placement. The stomach is also very high in position. 2. Cholelithiasis. 3. Aortic atherosclerosis.  As above, anatomy is not amenable to percutaneous gastrostomy placement. Consider surgical consultation for possible gastrostomy placement.  IR remains available as needed.  Lynnette Caffey, PA-C

## 2023-06-03 NOTE — Progress Notes (Signed)
 Occupational Therapy Treatment Patient Details Name: Derek Yates MRN: [id number] DOB: [birth year] Today's Date: 06/03/2023   History of present illness 83 yo male presents to [facility] on 2/19 as code stroke for speech deficits, L weakness. 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 PMH includes dementia, recurrent UTIs, CVA, HTN.   OT comments  Patient with no real progress toward patient focused goals.  Patient was more alert for brief periods, but demonstrates no initiation, no command following, no sequencing through functional task.  Patient remains hand over hand and total A.  OT can continue for a short amount of time, reassess at next goal update 3/28 and determine if OT remains appropriate.  LTACH has been recommended.  If not LTACH then SNF for possible LTC.       If plan is discharge home, recommend the following:  Assist for transportation;A lot of help with bathing/dressing/bathroom;Two people to help with walking and/or transfers;Assistance with feeding   Equipment Recommendations  None recommended by OT    Recommendations for Other Services      Precautions / Restrictions Precautions Precautions: Fall Recall of Precautions/Restrictions: Impaired Precaution/Restrictions Comments: PEG tube 3/14 Restrictions Weight Bearing Restrictions Per Provider Order: No       Mobility Bed Mobility Overal bed mobility: Needs Assistance Bed Mobility: Supine to Sit     Supine to sit: Total assist       Patient Response: Flat affect  Transfers Overall transfer level: Needs assistance Equipment used: 1 person hand held assist Transfers: Sit to/from Stand, Bed to chair/wheelchair/BSC Sit to Stand: Total assist Stand pivot transfers: Total assist         General transfer comment: No real attempts to initiate or sequence through activities.     Balance Overall balance assessment: Needs assistance Sitting-balance support: Feet supported Sitting  balance-Leahy Scale: Poor Sitting balance - Comments: Initially leaning to the left. External support to maintain upright posture. Severe kyphosis of C/T spine Postural control: Posterior lean, Left lateral lean Standing balance support: Bilateral upper extremity supported Standing balance-Leahy Scale: Zero                             ADL either performed or assessed with clinical judgement   ADL   Eating/Feeding: NPO   Grooming: Total assistance;Sitting           Upper Body Dressing : Total assistance;Sitting       Toilet Transfer: Total assistance   Toileting- Clothing Manipulation and Hygiene: Total assistance;Bed level              Extremity/Trunk Assessment Upper Extremity Assessment Upper Extremity Assessment: Generalized weakness   Lower Extremity Assessment Lower Extremity Assessment: Defer to PT evaluation   Cervical / Trunk Assessment Cervical / Trunk Assessment: Kyphotic    Vision Baseline Vision/History: 1 Wears glasses Patient Visual Report: No change from baseline     Perception Perception Perception: Not tested   Praxis Praxis Praxis: Not tested   Communication Communication Communication: Impaired Factors Affecting Communication: Difficulty expressing self;Reduced clarity of speech   Cognition Arousal: Lethargic Behavior During Therapy: Flat affect Cognition: History of cognitive impairments             OT - Cognition Comments: Periods of increased alertness, but short lived.                 Following commands: Impaired Following commands impaired: Follows one step commands  inconsistently      Cueing   Cueing Techniques: Verbal cues, Tactile cues, Gestural cues  Exercises      Shoulder Instructions       General Comments      Pertinent Vitals/ Pain       Pain Assessment Pain Assessment: PAINAD Breathing: normal Negative Vocalization: occasional moan/groan, low speech, negative/disapproving  quality Facial Expression: smiling or inexpressive Body Language: tense, distressed pacing, fidgeting Consolability: no need to console PAINAD Score: 2 Pain Location: generalized with movement Pain Descriptors / Indicators: Grimacing Pain Intervention(s): Monitored during session                                                          Frequency  Min 1X/week        Progress Toward Goals  OT Goals(current goals can now be found in the care plan section)  Progress towards OT goals: Not progressing toward goals - comment (No significant progress due to inability to participate)  Acute Rehab OT Goals OT Goal Formulation: Patient unable to participate in goal setting Time For Goal Achievement: 06/17/23 Potential to Achieve Goals: Fair  Plan      Co-evaluation                 AM-PAC OT "6 Clicks" Daily Activity     Outcome Measure   Help from another person eating meals?: Total Help from another person taking care of personal grooming?: Total Help from another person toileting, which includes using toliet, bedpan, or urinal?: Total Help from another person bathing (including washing, rinsing, drying)?: Total Help from another person to put on and taking off regular upper body clothing?: Total Help from another person to put on and taking off regular lower body clothing?: Total 6 Click Score: 6    End of Session    OT Visit Diagnosis: Unsteadiness on feet (R26.81);Other symptoms and signs involving cognitive function;Muscle weakness (generalized) (M62.81)   Activity Tolerance Patient limited by lethargy   Patient Left in chair;with call bell/phone within reach;with chair alarm set   Nurse Communication Mobility status        Time: 1610-9604 OT Time Calculation (min): 23 min  Charges: OT General Charges $OT Visit: 1 Visit OT Treatments $Self Care/Home Management : 8-22 mins $Therapeutic Activity: 8-22 mins  06/03/2023  RP,  OTR/L  Acute Rehabilitation Services  Office:  [phone]   Suzanna Obey 06/03/2023, 10:20 AM

## 2023-06-04 DIAGNOSIS — Z7189 Other specified counseling: Secondary | ICD-10-CM | POA: Diagnosis not present

## 2023-06-04 DIAGNOSIS — R1310 Dysphagia, unspecified: Secondary | ICD-10-CM | POA: Diagnosis not present

## 2023-06-04 DIAGNOSIS — I6389 Other cerebral infarction: Secondary | ICD-10-CM | POA: Diagnosis not present

## 2023-06-04 DIAGNOSIS — Z515 Encounter for palliative care: Secondary | ICD-10-CM | POA: Diagnosis not present

## 2023-06-04 LAB — GLUCOSE, CAPILLARY
Glucose-Capillary: 124 mg/dL — ABNORMAL HIGH (ref 70–99)
Glucose-Capillary: 130 mg/dL — ABNORMAL HIGH (ref 70–99)
Glucose-Capillary: 123 mg/dL — ABNORMAL HIGH (ref 70–99)
Glucose-Capillary: 129 mg/dL — ABNORMAL HIGH (ref 70–99)
Glucose-Capillary: 115 mg/dL — ABNORMAL HIGH (ref 70–99)
Glucose-Capillary: 120 mg/dL — ABNORMAL HIGH (ref 70–99)

## 2023-06-04 NOTE — Progress Notes (Addendum)
 [facility] PROGRESS NOTE    Progress Note  Derek Yates  [name]:[id number] DOB: 01-24-1941 DOA: 05/11/2023 PCP: [name], MD     Brief Narrative:   Derek Yates is an 83 y.o. male past medical history significant for essential hypertension, CVA recurrent UTI and dementia presented to [facility] with code stroke CT of the head showed intraparenchymal hemorrhage to the right caudate extending with intraventricular lesions, CCM consulted on 05/15/2023 for fevers for 48 hours, thought to be related to aspiration pneumonia transferred to CCM on 05/17/2023. 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  After family went to residential hospice facility he decided against it. Family would like aggressive care either to proceed to [facility] or skilled nursing facility. Patient is DNR/DNI. He will see consulted for LTAC versus skilled nursing facility placement. IR was consulted and relates patient's anatomy is not amenable to percutaneous G-tube. Will consult GI to see if they can place a PEG tube.  Recurrent aspiration pneumonia: CT chest on 05/17/2023 pneumonia. Has remained afebrile. Has completed his course of antibiotics on 06/02/2023  Acute respiratory failure with hypoxia due to aspiration pneumonia: Now been weaned to room air. Continue chest physiotherapy and inhalers.  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 Continue statins.  Acute kidney injury: Likely hemodynamic mediated resolved with IV fluid resuscitation.  Essential hypertension: Pressures well-controlled off antihypertensive medication.  Hyperlipidemia: Continue statins.  Dysphagia: Currently n.p.o. with a core track. Speech to evaluate.  Advance dementia: Severe memory loss  Protein-calorie  malnutrition, severe Noted.   DVT prophylaxis: SCD Family Communication:none Status is: Inpatient Remains inpatient appropriate because: Acute intracranial hemorrhage, okay to transfer to the floor    Code Status:     Code Status Orders  (From admission, onward)           Start     Ordered   05/17/23 1008  Do not attempt resuscitation (DNR)- Limited -Do Not Intubate (DNI)  (Code Status)  Continuous       Question Answer Comment  If pulseless and not breathing No CPR or chest compressions.   In Pre-Arrest Conditions (Patient Is Breathing and Has A Pulse) Do not intubate. Provide all appropriate non-invasive medical interventions. Avoid ICU transfer unless indicated or required.   Consent: Discussion documented in EHR or advanced directives reviewed      05/17/23 1008           Code Status History     Date Active Date Inactive Code Status Order ID Comments User Context   05/11/2023 2022 05/17/2023 1008 Full Code [id number]  [name], MD Inpatient         IV Access:   Peripheral IV   Procedures and diagnostic studies:   CT ABDOMEN WO CONTRAST Result Date: 06/03/2023 CLINICAL DATA:  Evaluation anatomy for possible percutaneous gastrostomy tube placement. EXAM: CT ABDOMEN WITHOUT CONTRAST TECHNIQUE: Multidetector CT imaging of the abdomen was performed following the standard protocol without IV contrast. RADIATION DOSE REDUCTION: This exam was performed according to the departmental dose-optimization program which includes automated exposure control, adjustment of the mA and/or kV according to patient size and/or use of iterative reconstruction technique. COMPARISON:  CT of the chest on 05/17/2023 FINDINGS: Lower chest: Mild bibasilar atelectasis. Hepatobiliary: Unremarkable liver. Calcified gallstone in the gallbladder neck.  No evidence of gallbladder distension or biliary ductal dilatation. Pancreas: Unremarkable. No pancreatic ductal dilatation or  surrounding inflammatory changes. Spleen: Normal in size without focal abnormality. Adrenals/Urinary Tract: Adrenal glands are unremarkable. Kidneys are normal, without renal calculi, focal lesion, or hydronephrosis. 6 cm exophytic Bosniak 1 cyst of the left kidney requires no follow-up. Stomach/Bowel: No hiatal hernia. A feeding tube within the distal esophagus and stomach terminates in the descending duodenum at the juncture of the second and third portions. There is interposition of the splenic flexure of the colon and transverse colon between the stomach and gastric wall with no percutaneous window for gastrostomy tube placement. The stomach is also very high in position. Anatomy is not amenable to percutaneous gastrostomy tube placement. No evidence of bowel obstruction, significant ileus or free intraperitoneal air. Vascular/Lymphatic: Atherosclerosis of the abdominal aorta without aneurysm. No enlarged lymph nodes identified. Other: No ascites or abnormal focal fluid collection. No abdominal wall hernia. Musculoskeletal: No acute or significant osseous findings. IMPRESSION: 1. Anatomy is not amenable to percutaneous gastrostomy tube placement. There is interposition of the splenic flexure of the colon and transverse colon between the stomach and gastric wall with no percutaneous window for gastrostomy tube placement. The stomach is also very high in position. 2. Cholelithiasis. 3. Aortic atherosclerosis. Electronically Signed   By: Irish Lack M.D.   On: 06/03/2023 16:10       Medical Consultants:   None.   Subjective:    Derek Yates sleepy this morning  Objective:    Vitals:   06/03/23 1230 06/03/23 1550 06/03/23 2032 06/04/23 0324  BP: 108/67 106/67 106/65 (!) 105/55  Pulse: 81 69 76 72  Resp: 16 17 20  (!) 22  Temp: (!) 97.5 F (36.4 C) 98 F (36.7 C) 97.6 F (36.4 C) 97.8 F (36.6 C)  TempSrc: Axillary Axillary Oral Axillary  SpO2: 96% 97% 93% 92%  Weight:       Height:       SpO2: 92 % O2 Flow Rate (L/min): 2 L/min   Intake/Output Summary (Last 24 hours) at 06/04/2023 0708 Last data filed at 06/04/2023 0325 Gross per 24 hour  Intake --  Output 1950 ml  Net -1950 ml   Filed Weights   05/31/23 0500 06/01/23 0406 06/02/23 0406  Weight: 63.3 kg 63.5 kg 63.7 kg    Exam: General exam: In no acute distress. Respiratory system: Good air movement and clear to auscultation. Cardiovascular system: S1 & S2 heard, RRR. No JVD. Gastrointestinal system: Abdomen is nondistended, soft and nontender.  Extremities: No pedal edema. Skin: No rashes, lesions or ulcers Psychiatry: No judgment or insight of medical condition.  Data Reviewed:    Labs: Basic Metabolic Panel: Recent Labs  Lab 05/29/23 0918 05/30/23 0733 05/31/23 0602  NA 136 137 135  K 4.5 4.4 4.2  CL 104 106 103  CO2 24 24 25   GLUCOSE 173* 132* 121*  BUN 35* 34* 32*  CREATININE 0.79 0.71 0.68  CALCIUM 8.9 8.8* 8.8*   GFR Estimated Creatinine Clearance: 60.8 mL/min (by C-G formula based on SCr of 0.68 mg/dL). Liver Function Tests: Recent Labs  Lab 05/29/23 0918 05/30/23 0733 05/31/23 0602  AST 29 22 18   ALT 29 24 21   ALKPHOS 51 50 45  BILITOT 0.6 0.5 0.6  PROT 6.6 6.3* 6.1*  ALBUMIN 2.5* 2.3* 2.3*    No results for input(s): "LIPASE", "AMYLASE" in the last 168 hours. No results for input(s): "AMMONIA" in the last 168 hours. Coagulation  profile No results for input(s): "INR", "PROTIME" in the last 168 hours.  COVID-19 Labs  No results for input(s): "DDIMER", "FERRITIN", "LDH", "CRP" in the last 72 hours.  Lab Results  Component Value Date   SARSCOV2NAA Not Detected 09/14/2018    CBC: Recent Labs  Lab 05/29/23 0918 05/30/23 0733 05/31/23 0602  WBC 10.2 8.6 6.6  HGB 11.2* 10.3* 10.2*  HCT 32.5* 30.1* 29.3*  MCV 82.9 84.1 83.2  PLT 443* 381 364   Cardiac Enzymes: No results for input(s): "CKTOTAL", "CKMB", "CKMBINDEX", "TROPONINI" in the last 168  hours. BNP (last 3 results) No results for input(s): "PROBNP" in the last 8760 hours. CBG: Recent Labs  Lab 06/03/23 1219 06/03/23 1559 06/03/23 2031 06/03/23 2312 06/04/23 0322  GLUCAP 117* 137* 112* 113* 124*   D-Dimer: No results for input(s): "DDIMER" in the last 72 hours. Hgb A1c: No results for input(s): "HGBA1C" in the last 72 hours. Lipid Profile: No results for input(s): "CHOL", "HDL", "LDLCALC", "TRIG", "CHOLHDL", "LDLDIRECT" in the last 72 hours. Thyroid function studies: No results for input(s): "TSH", "T4TOTAL", "T3FREE", "THYROIDAB" in the last 72 hours.  Invalid input(s): "FREET3" Anemia work up: No results for input(s): "VITAMINB12", "FOLATE", "FERRITIN", "TIBC", "IRON", "RETICCTPCT" in the last 72 hours. Sepsis Labs: Recent Labs  Lab 05/29/23 0918 05/30/23 0733 05/31/23 0602  WBC 10.2 8.6 6.6   Microbiology No results found for this or any previous visit (from the past 240 hours).    Medications:    amLODipine  10 mg Per Tube Daily   feeding supplement (PROSource TF20)  60 mL Per Tube BID   fiber supplement (BANATROL TF)  60 mL Per Tube BID   free water  200 mL Per Tube Q4H   guaiFENesin  15 mL Per Tube Q6H   heparin injection (subcutaneous)  5,000 Units Subcutaneous Q12H   insulin aspart  0-9 Units Subcutaneous Q4H   multivitamin with minerals  1 tablet Per Tube Daily   mouth rinse  15 mL Mouth Rinse 4 times per day   pantoprazole (PROTONIX) IV  40 mg Intravenous QHS   Continuous Infusions:  feeding supplement (OSMOLITE 1.5 CAL) 1,000 mL (06/03/23 1302)      LOS: 24 days   Marinda Elk  [facility]  06/04/2023, 7:08 AM

## 2023-06-04 NOTE — Plan of Care (Signed)
  Problem: Education: Goal: Knowledge of General Education information will improve Description: Including pain rating scale, medication(s)/side effects and non-pharmacologic comfort measures Outcome: Not Progressing   Problem: Health Behavior/Discharge Planning: Goal: Ability to manage health-related needs will improve Outcome: Not Progressing   Problem: Clinical Measurements: Goal: Ability to maintain clinical measurements within normal limits will improve Outcome: Not Progressing Goal: Will remain free from infection Outcome: Not Progressing Goal: Diagnostic test results will improve Outcome: Not Progressing Goal: Respiratory complications will improve Outcome: Not Progressing Goal: Cardiovascular complication will be avoided Outcome: Not Progressing   Problem: Activity: Goal: Risk for activity intolerance will decrease Outcome: Not Progressing   Problem: Nutrition: Goal: Adequate nutrition will be maintained Outcome: Not Progressing   Problem: Coping: Goal: Level of anxiety will decrease Outcome: Not Progressing   Problem: Elimination: Goal: Will not experience complications related to bowel motility Outcome: Not Progressing Goal: Will not experience complications related to urinary retention Outcome: Not Progressing   Problem: Pain Managment: Goal: General experience of comfort will improve and/or be controlled Outcome: Not Progressing   Problem: Safety: Goal: Ability to remain free from injury will improve Outcome: Not Progressing   Problem: Skin Integrity: Goal: Risk for impaired skin integrity will decrease Outcome: Not Progressing   Problem: Education: Goal: Knowledge of disease or condition will improve Outcome: Not Progressing Goal: Knowledge of secondary prevention will improve (MUST DOCUMENT ALL) Outcome: Not Progressing Goal: Knowledge of patient specific risk factors will improve (DELETE if not current risk factor) Outcome: Not Progressing    Problem: Intracerebral Hemorrhage Tissue Perfusion: Goal: Complications of Intracerebral Hemorrhage will be minimized Outcome: Not Progressing   Problem: Coping: Goal: Will verbalize positive feelings about self Outcome: Not Progressing Goal: Will identify appropriate support needs Outcome: Not Progressing   Problem: Health Behavior/Discharge Planning: Goal: Ability to manage health-related needs will improve Outcome: Not Progressing Goal: Goals will be collaboratively established with patient/family Outcome: Not Progressing   Problem: Self-Care: Goal: Ability to participate in self-care as condition permits will improve Outcome: Not Progressing Goal: Verbalization of feelings and concerns over difficulty with self-care will improve Outcome: Not Progressing Goal: Ability to communicate needs accurately will improve Outcome: Not Progressing   Problem: Nutrition: Goal: Risk of aspiration will decrease Outcome: Not Progressing Goal: Dietary intake will improve Outcome: Not Progressing   Problem: Education: Goal: Ability to describe self-care measures that may prevent or decrease complications (Diabetes Survival Skills Education) will improve Outcome: Not Progressing   Problem: Coping: Goal: Ability to adjust to condition or change in health will improve Outcome: Not Progressing   Problem: Fluid Volume: Goal: Ability to maintain a balanced intake and output will improve Outcome: Not Progressing   Problem: Health Behavior/Discharge Planning: Goal: Ability to identify and utilize available resources and services will improve Outcome: Not Progressing Goal: Ability to manage health-related needs will improve Outcome: Not Progressing   Problem: Metabolic: Goal: Ability to maintain appropriate glucose levels will improve Outcome: Not Progressing   Problem: Nutritional: Goal: Maintenance of adequate nutrition will improve Outcome: Not Progressing Goal: Progress toward  achieving an optimal weight will improve Outcome: Not Progressing   Problem: Skin Integrity: Goal: Risk for impaired skin integrity will decrease Outcome: Not Progressing   Problem: Tissue Perfusion: Goal: Adequacy of tissue perfusion will improve Outcome: Not Progressing

## 2023-06-04 NOTE — Plan of Care (Signed)
  Problem: Education: Goal: Knowledge of General Education information will improve Description: Including pain rating scale, medication(s)/side effects and non-pharmacologic comfort measures Outcome: Progressing   Problem: Health Behavior/Discharge Planning: Goal: Ability to manage health-related needs will improve Outcome: Progressing   Problem: Clinical Measurements: Goal: Ability to maintain clinical measurements within normal limits will improve Outcome: Progressing Goal: Will remain free from infection Outcome: Progressing Goal: Diagnostic test results will improve Outcome: Progressing Goal: Respiratory complications will improve Outcome: Progressing Goal: Cardiovascular complication will be avoided Outcome: Progressing   Problem: Activity: Goal: Risk for activity intolerance will decrease Outcome: Progressing   Problem: Nutrition: Goal: Adequate nutrition will be maintained Outcome: Progressing   Problem: Coping: Goal: Level of anxiety will decrease Outcome: Progressing   Problem: Elimination: Goal: Will not experience complications related to bowel motility Outcome: Progressing Goal: Will not experience complications related to urinary retention Outcome: Progressing   Problem: Pain Managment: Goal: General experience of comfort will improve and/or be controlled Outcome: Progressing   Problem: Safety: Goal: Ability to remain free from injury will improve Outcome: Progressing   Problem: Skin Integrity: Goal: Risk for impaired skin integrity will decrease Outcome: Progressing   Problem: Education: Goal: Knowledge of disease or condition will improve Outcome: Progressing Goal: Knowledge of secondary prevention will improve (MUST DOCUMENT ALL) Outcome: Progressing Goal: Knowledge of patient specific risk factors will improve (DELETE if not current risk factor) Outcome: Progressing   Problem: Intracerebral Hemorrhage Tissue Perfusion: Goal: Complications  of Intracerebral Hemorrhage will be minimized Outcome: Progressing   Problem: Coping: Goal: Will verbalize positive feelings about self Outcome: Progressing Goal: Will identify appropriate support needs Outcome: Progressing   Problem: Health Behavior/Discharge Planning: Goal: Ability to manage health-related needs will improve Outcome: Progressing Goal: Goals will be collaboratively established with patient/family Outcome: Progressing   Problem: Self-Care: Goal: Ability to participate in self-care as condition permits will improve Outcome: Progressing Goal: Verbalization of feelings and concerns over difficulty with self-care will improve Outcome: Progressing Goal: Ability to communicate needs accurately will improve Outcome: Progressing   Problem: Nutrition: Goal: Risk of aspiration will decrease Outcome: Progressing Goal: Dietary intake will improve Outcome: Progressing   Problem: Education: Goal: Ability to describe self-care measures that may prevent or decrease complications (Diabetes Survival Skills Education) will improve Outcome: Progressing   Problem: Coping: Goal: Ability to adjust to condition or change in health will improve Outcome: Progressing   Problem: Fluid Volume: Goal: Ability to maintain a balanced intake and output will improve Outcome: Progressing   Problem: Health Behavior/Discharge Planning: Goal: Ability to identify and utilize available resources and services will improve Outcome: Progressing Goal: Ability to manage health-related needs will improve Outcome: Progressing   Problem: Metabolic: Goal: Ability to maintain appropriate glucose levels will improve Outcome: Progressing   Problem: Nutritional: Goal: Maintenance of adequate nutrition will improve Outcome: Progressing Goal: Progress toward achieving an optimal weight will improve Outcome: Progressing

## 2023-06-04 NOTE — Plan of Care (Signed)
 Palliative-   Chart reviewed- Noted IR cannot to perc PEG- recommending surgical consult.  Palliative will follow peripherally and see as needed. Please call for any acute Palliative needs.   Ocie Bob, AGNP-C Palliative Medicine  No charge

## 2023-06-05 DIAGNOSIS — I6389 Other cerebral infarction: Secondary | ICD-10-CM | POA: Diagnosis not present

## 2023-06-05 LAB — GLUCOSE, CAPILLARY
Glucose-Capillary: 125 mg/dL — ABNORMAL HIGH (ref 70–99)
Glucose-Capillary: 118 mg/dL — ABNORMAL HIGH (ref 70–99)
Glucose-Capillary: 132 mg/dL — ABNORMAL HIGH (ref 70–99)
Glucose-Capillary: 128 mg/dL — ABNORMAL HIGH (ref 70–99)
Glucose-Capillary: 120 mg/dL — ABNORMAL HIGH (ref 70–99)
Glucose-Capillary: 116 mg/dL — ABNORMAL HIGH (ref 70–99)

## 2023-06-05 NOTE — Plan of Care (Signed)
  Problem: Education: Goal: Knowledge of General Education information will improve Description: Including pain rating scale, medication(s)/side effects and non-pharmacologic comfort measures Outcome: Not Progressing   Problem: Health Behavior/Discharge Planning: Goal: Ability to manage health-related needs will improve Outcome: Not Progressing   Problem: Clinical Measurements: Goal: Ability to maintain clinical measurements within normal limits will improve Outcome: Not Progressing Goal: Will remain free from infection Outcome: Not Progressing Goal: Diagnostic test results will improve Outcome: Not Progressing Goal: Respiratory complications will improve Outcome: Not Progressing Goal: Cardiovascular complication will be avoided Outcome: Not Progressing   Problem: Activity: Goal: Risk for activity intolerance will decrease Outcome: Not Progressing   Problem: Nutrition: Goal: Adequate nutrition will be maintained Outcome: Not Progressing   Problem: Coping: Goal: Level of anxiety will decrease Outcome: Not Progressing   Problem: Elimination: Goal: Will not experience complications related to bowel motility Outcome: Not Progressing Goal: Will not experience complications related to urinary retention Outcome: Not Progressing   Problem: Pain Managment: Goal: General experience of comfort will improve and/or be controlled Outcome: Not Progressing   Problem: Safety: Goal: Ability to remain free from injury will improve Outcome: Not Progressing   Problem: Skin Integrity: Goal: Risk for impaired skin integrity will decrease Outcome: Not Progressing   Problem: Education: Goal: Knowledge of disease or condition will improve Outcome: Not Progressing Goal: Knowledge of secondary prevention will improve (MUST DOCUMENT ALL) Outcome: Not Progressing Goal: Knowledge of patient specific risk factors will improve (DELETE if not current risk factor) Outcome: Not Progressing    Problem: Intracerebral Hemorrhage Tissue Perfusion: Goal: Complications of Intracerebral Hemorrhage will be minimized Outcome: Not Progressing   Problem: Coping: Goal: Will verbalize positive feelings about self Outcome: Not Progressing Goal: Will identify appropriate support needs Outcome: Not Progressing   Problem: Health Behavior/Discharge Planning: Goal: Ability to manage health-related needs will improve Outcome: Not Progressing Goal: Goals will be collaboratively established with patient/family Outcome: Not Progressing   Problem: Self-Care: Goal: Ability to participate in self-care as condition permits will improve Outcome: Not Progressing Goal: Verbalization of feelings and concerns over difficulty with self-care will improve Outcome: Not Progressing Goal: Ability to communicate needs accurately will improve Outcome: Not Progressing   Problem: Nutrition: Goal: Risk of aspiration will decrease Outcome: Not Progressing Goal: Dietary intake will improve Outcome: Not Progressing   Problem: Education: Goal: Ability to describe self-care measures that may prevent or decrease complications (Diabetes Survival Skills Education) will improve Outcome: Not Progressing   Problem: Coping: Goal: Ability to adjust to condition or change in health will improve Outcome: Not Progressing   Problem: Fluid Volume: Goal: Ability to maintain a balanced intake and output will improve Outcome: Not Progressing   Problem: Health Behavior/Discharge Planning: Goal: Ability to identify and utilize available resources and services will improve Outcome: Not Progressing Goal: Ability to manage health-related needs will improve Outcome: Not Progressing   Problem: Metabolic: Goal: Ability to maintain appropriate glucose levels will improve Outcome: Not Progressing   Problem: Nutritional: Goal: Maintenance of adequate nutrition will improve Outcome: Not Progressing Goal: Progress toward  achieving an optimal weight will improve Outcome: Not Progressing   Problem: Skin Integrity: Goal: Risk for impaired skin integrity will decrease Outcome: Not Progressing   Problem: Tissue Perfusion: Goal: Adequacy of tissue perfusion will improve Outcome: Not Progressing

## 2023-06-05 NOTE — Plan of Care (Signed)
  Problem: Education: Goal: Knowledge of General Education information will improve Description: Including pain rating scale, medication(s)/side effects and non-pharmacologic comfort measures Outcome: Progressing   Problem: Health Behavior/Discharge Planning: Goal: Ability to manage health-related needs will improve Outcome: Progressing   Problem: Clinical Measurements: Goal: Ability to maintain clinical measurements within normal limits will improve Outcome: Progressing Goal: Will remain free from infection Outcome: Progressing Goal: Diagnostic test results will improve Outcome: Progressing Goal: Respiratory complications will improve Outcome: Progressing Goal: Cardiovascular complication will be avoided Outcome: Progressing   Problem: Activity: Goal: Risk for activity intolerance will decrease Outcome: Progressing   Problem: Nutrition: Goal: Adequate nutrition will be maintained Outcome: Progressing   Problem: Coping: Goal: Level of anxiety will decrease Outcome: Progressing   Problem: Elimination: Goal: Will not experience complications related to bowel motility Outcome: Progressing Goal: Will not experience complications related to urinary retention Outcome: Progressing   Problem: Pain Managment: Goal: General experience of comfort will improve and/or be controlled Outcome: Progressing   Problem: Safety: Goal: Ability to remain free from injury will improve Outcome: Progressing   Problem: Skin Integrity: Goal: Risk for impaired skin integrity will decrease Outcome: Progressing   Problem: Education: Goal: Knowledge of disease or condition will improve Outcome: Progressing Goal: Knowledge of secondary prevention will improve (MUST DOCUMENT ALL) Outcome: Progressing Goal: Knowledge of patient specific risk factors will improve (DELETE if not current risk factor) Outcome: Progressing   Problem: Intracerebral Hemorrhage Tissue Perfusion: Goal: Complications  of Intracerebral Hemorrhage will be minimized Outcome: Progressing   Problem: Coping: Goal: Will verbalize positive feelings about self Outcome: Progressing Goal: Will identify appropriate support needs Outcome: Progressing   Problem: Health Behavior/Discharge Planning: Goal: Ability to manage health-related needs will improve Outcome: Progressing Goal: Goals will be collaboratively established with patient/family Outcome: Progressing   Problem: Self-Care: Goal: Ability to participate in self-care as condition permits will improve Outcome: Progressing Goal: Verbalization of feelings and concerns over difficulty with self-care will improve Outcome: Progressing Goal: Ability to communicate needs accurately will improve Outcome: Progressing   Problem: Nutrition: Goal: Risk of aspiration will decrease Outcome: Progressing Goal: Dietary intake will improve Outcome: Progressing   Problem: Education: Goal: Ability to describe self-care measures that may prevent or decrease complications (Diabetes Survival Skills Education) will improve Outcome: Progressing   Problem: Coping: Goal: Ability to adjust to condition or change in health will improve Outcome: Progressing   Problem: Fluid Volume: Goal: Ability to maintain a balanced intake and output will improve Outcome: Progressing   Problem: Health Behavior/Discharge Planning: Goal: Ability to identify and utilize available resources and services will improve Outcome: Progressing Goal: Ability to manage health-related needs will improve Outcome: Progressing   Problem: Metabolic: Goal: Ability to maintain appropriate glucose levels will improve Outcome: Progressing   Problem: Nutritional: Goal: Maintenance of adequate nutrition will improve Outcome: Progressing Goal: Progress toward achieving an optimal weight will improve Outcome: Progressing

## 2023-06-05 NOTE — Progress Notes (Signed)
 [facility] PROGRESS NOTE    Progress Note  Derek Yates  ZOX:[id number] DOB: 05-23-1940 DOA: 05/11/2023 PCP: Derek Monday, MD     Brief Narrative:   Derek Yates is an 83 y.o. male past medical history significant for essential hypertension, CVA recurrent UTI and dementia presented to [facility] with code stroke CT of the head showed intraparenchymal hemorrhage to the right caudate extending with intraventricular lesions, CCM consulted on 05/15/2023 for fevers for 48 hours, thought to be related to aspiration pneumonia transferred to CCM on 05/17/2023. 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  After family went to residential hospice facility he decided against it. Family would like aggressive care either to proceed to [facility] or skilled nursing facility. Patient is DNR/DNI. He will see consulted for LTAC versus skilled nursing facility placement. IR was consulted and relates patient's anatomy is not amenable to percutaneous G-tube. General surgery has been consulted and evaluated the patient on 06/06/2023 for surgical G-tube placement  Recurrent aspiration pneumonia: CT chest on 05/17/2023 pneumonia. Has completed his course of antibiotics on 06/02/2023  Acute respiratory failure with hypoxia due to aspiration pneumonia: Now been weaned to room air. Continue chest physiotherapy and inhalers.  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 Continue statins.  Acute kidney injury: Likely hemodynamic mediated resolved with IV fluid resuscitation.  Essential hypertension: Pressures well-controlled off antihypertensive medication.  Hyperlipidemia: Continue statins.  Dysphagia: Currently n.p.o. with a core track. Speech to evaluate.  Advance dementia: Severe memory  loss  Protein-calorie malnutrition, severe Noted.   DVT prophylaxis: SCD Family Communication:none Status is: Inpatient Remains inpatient appropriate because: Acute intracranial hemorrhage, okay to transfer to the floor    Code Status:     Code Status Orders  (From admission, onward)           Start     Ordered   05/17/23 1008  Do not attempt resuscitation (DNR)- Limited -Do Not Intubate (DNI)  (Code Status)  Continuous       Question Answer Comment  If pulseless and not breathing No CPR or chest compressions.   In Pre-Arrest Conditions (Patient Is Breathing and Has A Pulse) Do not intubate. Provide all appropriate non-invasive medical interventions. Avoid ICU transfer unless indicated or required.   Consent: Discussion documented in EHR or advanced directives reviewed      05/17/23 1008           Code Status History     Date Active Date Inactive Code Status Order ID Comments User Context   05/11/2023 2022 05/17/2023 1008 Full Code [id number]  [name], MD Inpatient         IV Access:   Peripheral IV   Procedures and diagnostic studies:   CT ABDOMEN WO CONTRAST Result Date: 06/03/2023 CLINICAL DATA:  Evaluation anatomy for possible percutaneous gastrostomy tube placement. EXAM: CT ABDOMEN WITHOUT CONTRAST TECHNIQUE: Multidetector CT imaging of the abdomen was performed following the standard protocol without IV contrast. RADIATION DOSE REDUCTION: This exam was performed according to the departmental dose-optimization program which includes automated exposure control, adjustment of the mA and/or kV according to patient size and/or use of iterative reconstruction technique. COMPARISON:  CT of the chest on 05/17/2023 FINDINGS: Lower chest: Mild bibasilar atelectasis. Hepatobiliary: Unremarkable liver. Calcified gallstone in the gallbladder neck.  No evidence of gallbladder distension or biliary ductal dilatation. Pancreas: Unremarkable. No pancreatic  ductal dilatation or surrounding inflammatory changes. Spleen: Normal in size without focal abnormality. Adrenals/Urinary Tract: Adrenal glands are unremarkable. Kidneys are normal, without renal calculi, focal lesion, or hydronephrosis. 6 cm exophytic Bosniak 1 cyst of the left kidney requires no follow-up. Stomach/Bowel: No hiatal hernia. A feeding tube within the distal esophagus and stomach terminates in the descending duodenum at the juncture of the second and third portions. There is interposition of the splenic flexure of the colon and transverse colon between the stomach and gastric wall with no percutaneous window for gastrostomy tube placement. The stomach is also very high in position. Anatomy is not amenable to percutaneous gastrostomy tube placement. No evidence of bowel obstruction, significant ileus or free intraperitoneal air. Vascular/Lymphatic: Atherosclerosis of the abdominal aorta without aneurysm. No enlarged lymph nodes identified. Other: No ascites or abnormal focal fluid collection. No abdominal wall hernia. Musculoskeletal: No acute or significant osseous findings. IMPRESSION: 1. Anatomy is not amenable to percutaneous gastrostomy tube placement. There is interposition of the splenic flexure of the colon and transverse colon between the stomach and gastric wall with no percutaneous window for gastrostomy tube placement. The stomach is also very high in position. 2. Cholelithiasis. 3. Aortic atherosclerosis. Electronically Signed   By: Irish Lack M.D.   On: 06/03/2023 16:10       Medical Consultants:   None.   Subjective:    [name] wake this am,not in pain  Objective:    Vitals:   06/04/23 1919 06/04/23 2319 06/05/23 0628 06/05/23 0759  BP: 110/65 116/71    Pulse: 77 86    Resp: (!) 24 (!) 21    Temp: 98.2 F (36.8 C) 98.5 F (36.9 C) 97.8 F (36.6 C) 98 F (36.7 C)  TempSrc: Oral Oral Oral Axillary  SpO2: 94% 94%    Weight:      Height:        SpO2: 94 % O2 Flow Rate (L/min): 2 L/min   Intake/Output Summary (Last 24 hours) at 06/05/2023 0809 Last data filed at 06/05/2023 0700 Gross per 24 hour  Intake 0 ml  Output 1202 ml  Net -1202 ml   Filed Weights   05/31/23 0500 06/01/23 0406 06/02/23 0406  Weight: 63.3 kg 63.5 kg 63.7 kg    Exam: General exam: In no acute distress. Respiratory system: Good air movement and clear to auscultation. Cardiovascular system: S1 & S2 heard, RRR. No JVD. Gastrointestinal system: Abdomen is nondistended, soft and nontender.  Extremities: No pedal edema. Skin: No rashes, lesions or ulcers Psychiatry: No judgment or insight of medical condition.  Data Reviewed:    Labs: Basic Metabolic Panel: Recent Labs  Lab 05/29/23 0918 05/30/23 0733 05/31/23 0602  NA 136 137 135  K 4.5 4.4 4.2  CL 104 106 103  CO2 24 24 25   GLUCOSE 173* 132* 121*  BUN 35* 34* 32*  CREATININE 0.79 0.71 0.68  CALCIUM 8.9 8.8* 8.8*   GFR Estimated Creatinine Clearance: 60.8 mL/min (by C-G formula based on SCr of 0.68 mg/dL). Liver Function Tests: Recent Labs  Lab 05/29/23 0918 05/30/23 0733 05/31/23 0602  AST 29 22 18   ALT 29 24 21   ALKPHOS 51 50 45  BILITOT 0.6 0.5 0.6  PROT 6.6 6.3* 6.1*  ALBUMIN 2.5* 2.3* 2.3*    No results for input(s): "LIPASE", "AMYLASE" in the last 168 hours. No results for input(s): "AMMONIA" in the last 168  hours. Coagulation profile No results for input(s): "INR", "PROTIME" in the last 168 hours.  COVID-19 Labs  No results for input(s): "DDIMER", "FERRITIN", "LDH", "CRP" in the last 72 hours.  Lab Results  Component Value Date   SARSCOV2NAA Not Detected 09/14/2018    CBC: Recent Labs  Lab 05/29/23 0918 05/30/23 0733 05/31/23 0602  WBC 10.2 8.6 6.6  HGB 11.2* 10.3* 10.2*  HCT 32.5* 30.1* 29.3*  MCV 82.9 84.1 83.2  PLT 443* 381 364   Cardiac Enzymes: No results for input(s): "CKTOTAL", "CKMB", "CKMBINDEX", "TROPONINI" in the last 168 hours. BNP  (last 3 results) No results for input(s): "PROBNP" in the last 8760 hours. CBG: Recent Labs  Lab 06/04/23 1159 06/04/23 1536 06/04/23 1927 06/04/23 2329 06/05/23 0758  GLUCAP 123* 129* 115* 120* 118*   D-Dimer: No results for input(s): "DDIMER" in the last 72 hours. Hgb A1c: No results for input(s): "HGBA1C" in the last 72 hours. Lipid Profile: No results for input(s): "CHOL", "HDL", "LDLCALC", "TRIG", "CHOLHDL", "LDLDIRECT" in the last 72 hours. Thyroid function studies: No results for input(s): "TSH", "T4TOTAL", "T3FREE", "THYROIDAB" in the last 72 hours.  Invalid input(s): "FREET3" Anemia work up: No results for input(s): "VITAMINB12", "FOLATE", "FERRITIN", "TIBC", "IRON", "RETICCTPCT" in the last 72 hours. Sepsis Labs: Recent Labs  Lab 05/29/23 0918 05/30/23 0733 05/31/23 0602  WBC 10.2 8.6 6.6   Microbiology No results found for this or any previous visit (from the past 240 hours).    Medications:    amLODipine  10 mg Per Tube Daily   feeding supplement (PROSource TF20)  60 mL Per Tube BID   fiber supplement (BANATROL TF)  60 mL Per Tube BID   free water  200 mL Per Tube Q4H   guaiFENesin  15 mL Per Tube Q6H   heparin injection (subcutaneous)  5,000 Units Subcutaneous Q12H   insulin aspart  0-9 Units Subcutaneous Q4H   multivitamin with minerals  1 tablet Per Tube Daily   mouth rinse  15 mL Mouth Rinse 4 times per day   pantoprazole (PROTONIX) IV  40 mg Intravenous QHS   Continuous Infusions:  feeding supplement (OSMOLITE 1.5 CAL) 1,000 mL (06/04/23 1225)      LOS: 25 days   Marinda Elk  [facility]  06/05/2023, 8:09 AM

## 2023-06-06 DIAGNOSIS — R1319 Other dysphagia: Secondary | ICD-10-CM | POA: Diagnosis not present

## 2023-06-06 DIAGNOSIS — J690 Pneumonitis due to inhalation of food and vomit: Secondary | ICD-10-CM | POA: Diagnosis not present

## 2023-06-06 DIAGNOSIS — F03A0 Unspecified dementia, mild, without behavioral disturbance, psychotic disturbance, mood disturbance, and anxiety: Secondary | ICD-10-CM | POA: Diagnosis not present

## 2023-06-06 DIAGNOSIS — I6389 Other cerebral infarction: Secondary | ICD-10-CM | POA: Diagnosis not present

## 2023-06-06 LAB — GLUCOSE, CAPILLARY
Glucose-Capillary: 125 mg/dL — ABNORMAL HIGH (ref 70–99)
Glucose-Capillary: 117 mg/dL — ABNORMAL HIGH (ref 70–99)
Glucose-Capillary: 129 mg/dL — ABNORMAL HIGH (ref 70–99)
Glucose-Capillary: 144 mg/dL — ABNORMAL HIGH (ref 70–99)
Glucose-Capillary: 151 mg/dL — ABNORMAL HIGH (ref 70–99)
Glucose-Capillary: 117 mg/dL — ABNORMAL HIGH (ref 70–99)
Glucose-Capillary: 123 mg/dL — ABNORMAL HIGH (ref 70–99)

## 2023-06-06 LAB — BASIC METABOLIC PANEL
Anion gap: 7 (ref 5–15)
BUN: 25 mg/dL — ABNORMAL HIGH (ref 8–23)
CO2: 28 mmol/L (ref 22–32)
Calcium: 9.1 mg/dL (ref 8.9–10.3)
Chloride: 98 mmol/L (ref 98–111)
Creatinine, Ser: 0.68 mg/dL (ref 0.61–1.24)
GFR, Estimated: >60 mL/min (ref >60)
Glucose, Bld: 107 mg/dL — ABNORMAL HIGH (ref 70–99)
Potassium: 4.4 mmol/L (ref 3.5–5.1)
Sodium: 133 mmol/L — ABNORMAL LOW (ref 135–145)

## 2023-06-06 MED ORDER — CEFAZOLIN SODIUM-DEXTROSE 2-4 GM/100ML-% IV SOLN
2.0000 g | Freq: Once | INTRAVENOUS | Status: AC
  Administered 2023-06-07: 2 g via INTRAVENOUS
  Filled 2023-06-06: qty 100

## 2023-06-06 NOTE — H&P (Signed)
 CHOU BUSLER [birth year]  [id number].    Requesting MD: David Stall, MD Chief Complaint/Reason for Consult: dysphagia, PEG placement   HPI:  Derek Yates is an 83 y/o M with PMH HTN, UTI, CVA, and dementia who presented to [facility] with speech deficits and left-sided weakness. CTH revealed intraparenchymal hemorrhage in the right caudate with extensive intraventricular extension. He was transferred to [facility] ICU for close monitoring. He was also treated for AKI. His hospital course has been complicated by aspiration pneumonia, respiratory failure, and possible neurogenic fever. He has been receiving nutrition via cortrak and has been unable to progress with speech therapy to tolerate PO intake. General surgery is consulted for consideration of PEG placement, after CT scan of the abdomen showed that the stomach was not amenable to percutaneous gastrostomy tube by IR, due to colon being between the abdominal wall and the stomach.   No reported past abdominal surgical history. Denies tobacco, alcohol, or drug use.  He is not on any blood thinners, just prophylactic subcutaneous heparin here in the hospital.   Of note palliative has been following this patient to help with GOC. Patients wife is his legal next of kin and resides in an ALF. His son, Mr. Gerad Cornelio, has been present and participating in these discussions. Patient is DNR/DNI. They have elected to pursue G tube placement and LTAC.   ROS: Review of Systems  Unable to perform ROS: Mental acuity    History reviewed. No pertinent family history.  History reviewed. No pertinent past medical history.  History reviewed. No pertinent surgical history.  Social History:  reports that he has never smoked. He has never used smokeless tobacco. No history on file for alcohol use and drug use.  Allergies:  Allergies  Allergen Reactions   Beef-Derived Drug Products Other (See Comments)    Muslim   Pork-Derived Products Other  (See Comments)    Muslim    Medications Prior to Admission  Medication Sig Dispense Refill   atorvastatin (LIPITOR) 10 MG tablet TAKE 1 TABLET BY MOUTH DAILY 90 tablet 1   diazepam (VALIUM) 2 MG tablet TAKE 1 TABLET BY MOUTH DAILY 90 tablet 1   fluticasone furoate-vilanterol (BREO ELLIPTA) 100-25 MCG/ACT AEPB Inhale 1 puff into the lungs daily as needed (SOB).     NIFEdipine (PROCARDIA-XL/NIFEDICAL-XL) 30 MG 24 hr tablet TAKE 1 TABLET BY MOUTH IN THE  MORNING 90 tablet 3   [EXPIRED] polyethylene glycol (MIRALAX) 17 g packet Take 17 g by mouth daily for 14 days. Max 7 days at a time 7 each 1     Physical Exam: Blood pressure (!) 114/59, pulse 79, temperature 98.4 F (36.9 C), temperature source Oral, resp. rate 20, height 5\' 8"  (1.727 m), weight 61.5 kg, SpO2 93%. General: chronically ill appearing elderly male, NAD HEENT: head -normocephalic, atraumatic; Eyes: PERRLA, no conjunctival injection,anicteric sclerae, cortrak in place. Neck- Trachea is midline CV- RRR, normal S1/S2, no M/R/G,no lower extremity edema  Pulm- breathing is non-labored. CTABL, no wheezes, rhales, rhonchi. Abd- soft, NT/ND, no masses, hernias, or organomegaly. GU- deferred  MSK- UE/LE symmetrical, no cyanosis, clubbing, or edema. Neuro- left facial droop. Left-sided weakness Psych- Alert with appropriate affect Skin: warm and dry, no rashes or lesions   Results for orders placed or performed during the hospital encounter of 05/11/23 (from the past 48 hours)  Glucose, capillary     Status: Abnormal   Collection Time: 06/04/23  3:36 PM  Result Value Ref Range  Glucose-Capillary 129 (H) 70 - 99 mg/dL    Comment: Glucose reference range applies only to samples taken after fasting for at least 8 hours.  Glucose, capillary     Status: Abnormal   Collection Time: 06/04/23  7:27 PM  Result Value Ref Range   Glucose-Capillary 115 (H) 70 - 99 mg/dL    Comment: Glucose reference range applies only to samples taken  after fasting for at least 8 hours.  Glucose, capillary     Status: Abnormal   Collection Time: 06/04/23 11:29 PM  Result Value Ref Range   Glucose-Capillary 120 (H) 70 - 99 mg/dL    Comment: Glucose reference range applies only to samples taken after fasting for at least 8 hours.  Glucose, capillary     Status: Abnormal   Collection Time: 06/05/23  4:47 AM  Result Value Ref Range   Glucose-Capillary 125 (H) 70 - 99 mg/dL    Comment: Glucose reference range applies only to samples taken after fasting for at least 8 hours.  Glucose, capillary     Status: Abnormal   Collection Time: 06/05/23  7:58 AM  Result Value Ref Range   Glucose-Capillary 118 (H) 70 - 99 mg/dL    Comment: Glucose reference range applies only to samples taken after fasting for at least 8 hours.  Glucose, capillary     Status: Abnormal   Collection Time: 06/05/23 12:04 PM  Result Value Ref Range   Glucose-Capillary 132 (H) 70 - 99 mg/dL    Comment: Glucose reference range applies only to samples taken after fasting for at least 8 hours.  Glucose, capillary     Status: Abnormal   Collection Time: 06/05/23  3:19 PM  Result Value Ref Range   Glucose-Capillary 128 (H) 70 - 99 mg/dL    Comment: Glucose reference range applies only to samples taken after fasting for at least 8 hours.  Glucose, capillary     Status: Abnormal   Collection Time: 06/05/23  8:04 PM  Result Value Ref Range   Glucose-Capillary 120 (H) 70 - 99 mg/dL    Comment: Glucose reference range applies only to samples taken after fasting for at least 8 hours.  Glucose, capillary     Status: Abnormal   Collection Time: 06/05/23 11:23 PM  Result Value Ref Range   Glucose-Capillary 116 (H) 70 - 99 mg/dL    Comment: Glucose reference range applies only to samples taken after fasting for at least 8 hours.  Glucose, capillary     Status: Abnormal   Collection Time: 06/06/23  3:40 AM  Result Value Ref Range   Glucose-Capillary 125 (H) 70 - 99 mg/dL     Comment: Glucose reference range applies only to samples taken after fasting for at least 8 hours.  Basic metabolic panel     Status: Abnormal   Collection Time: 06/06/23  6:22 AM  Result Value Ref Range   Sodium 133 (L) 135 - 145 mmol/L   Potassium 4.4 3.5 - 5.1 mmol/L   Chloride 98 98 - 111 mmol/L   CO2 28 22 - 32 mmol/L   Glucose, Bld 107 (H) 70 - 99 mg/dL    Comment: Glucose reference range applies only to samples taken after fasting for at least 8 hours.   BUN 25 (H) 8 - 23 mg/dL   Creatinine, Ser 4.09 0.61 - 1.24 mg/dL   Calcium 9.1 8.9 - 81.1 mg/dL   GFR, Estimated >91 >47 mL/min    Comment: (NOTE) Calculated using  the CKD-EPI Creatinine Equation (2021)    Anion gap 7 5 - 15    Comment: Performed at [facility], [address] [place], [place] [address]  Glucose, capillary     Status: Abnormal   Collection Time: 06/06/23  8:00 AM  Result Value Ref Range   Glucose-Capillary 117 (H) 70 - 99 mg/dL    Comment: Glucose reference range applies only to samples taken after fasting for at least 8 hours.  Glucose, capillary     Status: Abnormal   Collection Time: 06/06/23 11:35 AM  Result Value Ref Range   Glucose-Capillary 129 (H) 70 - 99 mg/dL    Comment: Glucose reference range applies only to samples taken after fasting for at least 8 hours.   No results found.    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  - plan to attempt PEG tube placement, possibility of conversions to laparoscopic vs open G-tube placement if unable to successfully/safely place G tube endoscopically.  - tentative plan to do this procedure tomorrow in the OR. Hold TF at MN.   The operative and non-operative management of dysphagia was discussed with the patient. Risks of surgery including bleeding, infection, damage to  surrounding structures, conversion to open,  need for additional procedures, as well as the risks of general anesthesia were discussed with the patient and he would like to proceed with surgery. Questions were welcomed and answered. The patients POA  was educated on the risks/benefits of the procedure and will sign the consent form on the patients behalf.    FEN - NPO, TF @ goal via cortrak VTE - SQH ID - none at present Admit - TRH Service   Aspiration PNA - completed abx treatment 3/13 Acute respiratory failure due to above AKI - resolved Cerebral amyloid angiopathy (CAA) HTN Dementia  Protein-calorie malnutrition   I reviewed Consultant palliative, IR notes, hospitalist notes, last 24 h vitals and pain scores, last 48 h intake and output, last 24 h labs and trends, and last 24 h imaging results.  Juliet Rude, [facility] Surgery 06/06/2023, 2:22 PM Please see Amion for pager number during day hours 7:00am-4:30pm

## 2023-06-06 NOTE — Plan of Care (Signed)
                                                     Palliative Care Progress Note   Patient Name: Derek Yates       Date: 06/06/2023 DOB: 13-Oct-1940  Age: 83 y.o. MRN#: [id number] Attending Physician: Marinda Elk, MD Primary Care Physician: [name], MD Admit Date: 05/11/2023  Extensive chart review completed including labs, vital signs, imaging, progress notes, orders, and available advanced directive documents from current and previous encounters.   Surgery consulted to discuss placement of PEG tube and evaluated patient/spoke with family today, 3/17.    Plan is to attempt PEG tube placement tomorrow, 3/17, with possibility of conversions to laparoscopic vs open G-tube placement if unable to successfully/safely place G tube endoscopically.   No acute palliative needs at this time.   Thank you for allowing the Palliative Medicine Team to assist in the care of [facility].  Samara Deist L. Bonita Quin, DNP, FNP-BC Palliative Medicine Team    No charge

## 2023-06-06 NOTE — Plan of Care (Signed)
  Problem: Clinical Measurements: Goal: Will remain free from infection Outcome: Progressing   Problem: Health Behavior/Discharge Planning: Goal: Ability to manage health-related needs will improve Outcome: Progressing   Problem: Activity: Goal: Risk for activity intolerance will decrease Outcome: Progressing   Problem: Nutrition: Goal: Adequate nutrition will be maintained Outcome: Progressing   Problem: Coping: Goal: Will verbalize positive feelings about self Outcome: Progressing

## 2023-06-06 NOTE — Progress Notes (Signed)
 [facility] PROGRESS NOTE    Progress Note  Derek Yates  ZOX:[id number] DOB: 31-Dec-1940 DOA: 05/11/2023 PCP: [name], MD     Brief Narrative:   Derek Yates is an 83 y.o. male past medical history significant for essential hypertension, CVA recurrent UTI and dementia presented to [facility] with code stroke CT of the head showed intraparenchymal hemorrhage to the right caudate extending with intraventricular lesions, CCM consulted on 05/15/2023 for fevers for 48 hours, thought to be related to aspiration pneumonia transferred to CCM on 05/17/2023. 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  Family would like aggressive care either to proceed to [facility] or skilled nursing facility. Patient is DNR/DNI. IR was consulted and relates patient's anatomy is not amenable to percutaneous G-tube. General surgery has been consulted and evaluated the patient today to see if he is a candidate for G-tube placement for nutrition.  Recurrent aspiration pneumonia: CT chest on 05/17/2023 pneumonia. Has completed his course of antibiotics on 06/02/2023  Acute respiratory failure with hypoxia due to aspiration pneumonia: Now been weaned to room air. Continue chest physiotherapy and inhalers.  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 Continue statins.  Acute kidney injury: Likely hemodynamic mediated resolved with IV fluid resuscitation.  Essential hypertension: Pressures well-controlled off antihypertensive medication.  Hyperlipidemia: Continue statins.  Dysphagia: Currently n.p.o. with a core track. Speech to evaluate.  Advance dementia: Severe memory loss  Protein-calorie malnutrition, severe Noted.   DVT prophylaxis: SCD's Family Communication:none Status is:  Inpatient Remains inpatient appropriate because: Acute intracranial hemorrhage, okay to transfer to the floor    Code Status:     Code Status Orders  (From admission, onward)           Start     Ordered   05/17/23 1008  Do not attempt resuscitation (DNR)- Limited -Do Not Intubate (DNI)  (Code Status)  Continuous       Question Answer Comment  If pulseless and not breathing No CPR or chest compressions.   In Pre-Arrest Conditions (Patient Is Breathing and Has A Pulse) Do not intubate. Provide all appropriate non-invasive medical interventions. Avoid ICU transfer unless indicated or required.   Consent: Discussion documented in EHR or advanced directives reviewed      05/17/23 1008           Code Status History     Date Active Date Inactive Code Status Order ID Comments User Context   05/11/2023 2022 05/17/2023 1008 Full Code [id number]  [name], MD Inpatient         IV Access:   Peripheral IV   Procedures and diagnostic studies:   No results found.      Medical Consultants:   None.   Subjective:    Derek Yates no complaints.  Objective:    Vitals:   06/05/23 1618 06/05/23 2003 06/05/23 2325 06/06/23 0335  BP: 113/62 114/63 109/61 99/60  Pulse: 80 82 84 79  Resp: 18 15 19 20   Temp: 97.8 F (36.6 C) 97.8 F (36.6 C) 98 F (36.7 C) 98.2 F (36.8 C)  TempSrc: Oral Oral Axillary Oral  SpO2: 96% 95% 96% 94%  Weight:    61.5 kg  Height:       SpO2: 94 % O2 Flow Rate (L/min): 2  L/min   Intake/Output Summary (Last 24 hours) at 06/06/2023 0734 Last data filed at 06/06/2023 0345 Gross per 24 hour  Intake 60 ml  Output 1650 ml  Net -1590 ml   Filed Weights   06/01/23 0406 06/02/23 0406 06/06/23 0335  Weight: 63.5 kg 63.7 kg 61.5 kg    Exam: General exam: In no acute distress, cachectic Respiratory system: Good air movement and clear to auscultation. Cardiovascular system: S1 & S2 heard, RRR. No JVD. Gastrointestinal  system: Abdomen is nondistended, soft and nontender.  Extremities: No pedal edema. Skin: No rashes, lesions or ulcers  Data Reviewed:    Labs: Basic Metabolic Panel: Recent Labs  Lab 05/31/23 0602  NA 135  K 4.2  CL 103  CO2 25  GLUCOSE 121*  BUN 32*  CREATININE 0.68  CALCIUM 8.8*   GFR Estimated Creatinine Clearance: 58.7 mL/min (by C-G formula based on SCr of 0.68 mg/dL). Liver Function Tests: Recent Labs  Lab 05/31/23 0602  AST 18  ALT 21  ALKPHOS 45  BILITOT 0.6  PROT 6.1*  ALBUMIN 2.3*    No results for input(s): "LIPASE", "AMYLASE" in the last 168 hours. No results for input(s): "AMMONIA" in the last 168 hours. Coagulation profile No results for input(s): "INR", "PROTIME" in the last 168 hours.  COVID-19 Labs  No results for input(s): "DDIMER", "FERRITIN", "LDH", "CRP" in the last 72 hours.  Lab Results  Component Value Date   SARSCOV2NAA Not Detected 09/14/2018    CBC: Recent Labs  Lab 05/31/23 0602  WBC 6.6  HGB 10.2*  HCT 29.3*  MCV 83.2  PLT 364   Cardiac Enzymes: No results for input(s): "CKTOTAL", "CKMB", "CKMBINDEX", "TROPONINI" in the last 168 hours. BNP (last 3 results) No results for input(s): "PROBNP" in the last 8760 hours. CBG: Recent Labs  Lab 06/05/23 1204 06/05/23 1519 06/05/23 2004 06/05/23 2323 06/06/23 0340  GLUCAP 132* 128* 120* 116* 125*   D-Dimer: No results for input(s): "DDIMER" in the last 72 hours. Hgb A1c: No results for input(s): "HGBA1C" in the last 72 hours. Lipid Profile: No results for input(s): "CHOL", "HDL", "LDLCALC", "TRIG", "CHOLHDL", "LDLDIRECT" in the last 72 hours. Thyroid function studies: No results for input(s): "TSH", "T4TOTAL", "T3FREE", "THYROIDAB" in the last 72 hours.  Invalid input(s): "FREET3" Anemia work up: No results for input(s): "VITAMINB12", "FOLATE", "FERRITIN", "TIBC", "IRON", "RETICCTPCT" in the last 72 hours. Sepsis Labs: Recent Labs  Lab 05/31/23 0602  WBC 6.6    Microbiology No results found for this or any previous visit (from the past 240 hours).    Medications:    amLODipine  10 mg Per Tube Daily   feeding supplement (PROSource TF20)  60 mL Per Tube BID   fiber supplement (BANATROL TF)  60 mL Per Tube BID   free water  200 mL Per Tube Q4H   guaiFENesin  15 mL Per Tube Q6H   heparin injection (subcutaneous)  5,000 Units Subcutaneous Q12H   insulin aspart  0-9 Units Subcutaneous Q4H   multivitamin with minerals  1 tablet Per Tube Daily   mouth rinse  15 mL Mouth Rinse 4 times per day   pantoprazole (PROTONIX) IV  40 mg Intravenous QHS   Continuous Infusions:  feeding supplement (OSMOLITE 1.5 CAL) 1,000 mL (06/05/23 1220)      LOS: 26 days   Marinda Elk  [facility]  06/06/2023, 7:34 AM

## 2023-06-07 ENCOUNTER — Inpatient Hospital Stay (HOSPITAL_COMMUNITY): Admitting: Anesthesiology

## 2023-06-07 ENCOUNTER — Encounter (HOSPITAL_COMMUNITY): Payer: Self-pay | Admitting: Neurology

## 2023-06-07 ENCOUNTER — Encounter (HOSPITAL_COMMUNITY)
Admission: EM | Disposition: A | Discharge status: Skilled Nursing Facility | Payer: Self-pay | Attending: Internal Medicine

## 2023-06-07 DIAGNOSIS — E782 Mixed hyperlipidemia: Secondary | ICD-10-CM | POA: Diagnosis not present

## 2023-06-07 DIAGNOSIS — I6389 Other cerebral infarction: Secondary | ICD-10-CM | POA: Diagnosis not present

## 2023-06-07 DIAGNOSIS — I10 Essential (primary) hypertension: Secondary | ICD-10-CM

## 2023-06-07 DIAGNOSIS — R1310 Dysphagia, unspecified: Secondary | ICD-10-CM | POA: Diagnosis not present

## 2023-06-07 DIAGNOSIS — J690 Pneumonitis due to inhalation of food and vomit: Secondary | ICD-10-CM | POA: Diagnosis not present

## 2023-06-07 DIAGNOSIS — F03A0 Unspecified dementia, mild, without behavioral disturbance, psychotic disturbance, mood disturbance, and anxiety: Secondary | ICD-10-CM | POA: Diagnosis not present

## 2023-06-07 DIAGNOSIS — R1319 Other dysphagia: Secondary | ICD-10-CM | POA: Diagnosis not present

## 2023-06-07 HISTORY — PX: LAPAROSCOPIC GASTROSTOMY: SHX5896

## 2023-06-07 HISTORY — PX: PEG PLACEMENT: SHX5437

## 2023-06-07 LAB — GLUCOSE, CAPILLARY
Glucose-Capillary: 85 mg/dL (ref 70–99)
Glucose-Capillary: 95 mg/dL (ref 70–99)
Glucose-Capillary: 104 mg/dL — ABNORMAL HIGH (ref 70–99)
Glucose-Capillary: 98 mg/dL (ref 70–99)
Glucose-Capillary: 110 mg/dL — ABNORMAL HIGH (ref 70–99)
Glucose-Capillary: 125 mg/dL — ABNORMAL HIGH (ref 70–99)

## 2023-06-07 SURGERY — INSERTION, PEG TUBE
Anesthesia: General

## 2023-06-07 MED ORDER — PHENYLEPHRINE 80 MCG/ML (10ML) SYRINGE FOR IV PUSH (FOR BLOOD PRESSURE SUPPORT)
PREFILLED_SYRINGE | INTRAVENOUS | Status: AC
  Filled 2023-06-07: qty 10

## 2023-06-07 MED ORDER — MEPERIDINE HCL 25 MG/ML IJ SOLN: 6.2500 mg | INTRAMUSCULAR | Status: DC | PRN

## 2023-06-07 MED ORDER — PROPOFOL 10 MG/ML IV BOLUS
INTRAVENOUS | Status: DC | PRN
  Administered 2023-06-07: 100 mg via INTRAVENOUS

## 2023-06-07 MED ORDER — OXYCODONE HCL 5 MG/5ML PO SOLN: 5.0000 mg | Freq: Once | ORAL | Status: DC | PRN

## 2023-06-07 MED ORDER — ACETAMINOPHEN 325 MG PO TABS: 325.0000 mg | ORAL_TABLET | ORAL | Status: DC | PRN

## 2023-06-07 MED ORDER — ONDANSETRON HCL 4 MG/2ML IJ SOLN
INTRAMUSCULAR | Status: DC | PRN
  Administered 2023-06-07: 4 mg via INTRAVENOUS

## 2023-06-07 MED ORDER — FLUCONAZOLE 100MG IVPB
100.0000 mg | INTRAVENOUS | Status: DC
  Administered 2023-06-08 – 2023-06-10 (×3): 100 mg via INTRAVENOUS
  Filled 2023-06-07 (×4): qty 50

## 2023-06-07 MED ORDER — ACETAMINOPHEN 160 MG/5ML PO SOLN: 325.0000 mg | ORAL | Status: DC | PRN

## 2023-06-07 MED ORDER — ORAL CARE MOUTH RINSE: 15.0000 mL | Freq: Once | OROMUCOSAL | Status: DC

## 2023-06-07 MED ORDER — FENTANYL CITRATE (PF) 100 MCG/2ML IJ SOLN: 25.0000 ug | INTRAMUSCULAR | Status: DC | PRN

## 2023-06-07 MED ORDER — OSMOLITE 1.2 CAL PO LIQD: 1000.0000 mL | ORAL | Status: DC

## 2023-06-07 MED ORDER — ACETAMINOPHEN 10 MG/ML IV SOLN
INTRAVENOUS | Status: AC
  Filled 2023-06-07: qty 100

## 2023-06-07 MED ORDER — CHLORHEXIDINE GLUCONATE 0.12 % MT SOLN
OROMUCOSAL | Status: AC
Start: 2023-06-07 — End: 2023-06-07
  Administered 2023-06-07: 15 mL
  Filled 2023-06-07: qty 15

## 2023-06-07 MED ORDER — ONDANSETRON HCL 4 MG/2ML IJ SOLN: 4.0000 mg | Freq: Once | INTRAMUSCULAR | Status: DC | PRN

## 2023-06-07 MED ORDER — OSMOLITE 1.5 CAL PO LIQD
1000.0000 mL | ORAL | Status: DC
  Administered 2023-06-07 – 2023-06-09 (×3): 1000 mL

## 2023-06-07 MED ORDER — ROCURONIUM BROMIDE 10 MG/ML (PF) SYRINGE
PREFILLED_SYRINGE | INTRAVENOUS | Status: AC
  Filled 2023-06-07: qty 10

## 2023-06-07 MED ORDER — ACETAMINOPHEN 10 MG/ML IV SOLN
INTRAVENOUS | Status: DC | PRN
  Administered 2023-06-07: 1000 mg via INTRAVENOUS

## 2023-06-07 MED ORDER — LIDOCAINE 1 % OPTIME INJ - NO CHARGE
INTRAMUSCULAR | Status: DC | PRN
Start: 2023-06-07 — End: 2023-06-07
  Administered 2023-06-07: 5 mL

## 2023-06-07 MED ORDER — CHLORHEXIDINE GLUCONATE 0.12 % MT SOLN: 15.0000 mL | Freq: Once | OROMUCOSAL | Status: DC

## 2023-06-07 MED ORDER — LIDOCAINE 2% (20 MG/ML) 5 ML SYRINGE
INTRAMUSCULAR | Status: AC
  Filled 2023-06-07: qty 5

## 2023-06-07 MED ORDER — SUGAMMADEX SODIUM 200 MG/2ML IV SOLN
INTRAVENOUS | Status: AC
  Filled 2023-06-07: qty 2

## 2023-06-07 MED ORDER — SUGAMMADEX SODIUM 200 MG/2ML IV SOLN
INTRAVENOUS | Status: DC | PRN
  Administered 2023-06-07: 243.2 mg via INTRAVENOUS

## 2023-06-07 MED ORDER — LACTATED RINGERS IV SOLN: INTRAVENOUS | Status: DC

## 2023-06-07 MED ORDER — FLUCONAZOLE IN SODIUM CHLORIDE 200-0.9 MG/100ML-% IV SOLN
200.0000 mg | Freq: Once | INTRAVENOUS | Status: AC
  Administered 2023-06-07: 200 mg via INTRAVENOUS
  Filled 2023-06-07: qty 100

## 2023-06-07 MED ORDER — OXYCODONE HCL 5 MG PO TABS: 5.0000 mg | ORAL_TABLET | Freq: Once | ORAL | Status: DC | PRN

## 2023-06-07 MED ORDER — STERILE WATER FOR IRRIGATION IR SOLN
Status: DC | PRN
  Administered 2023-06-07: 1000 mL

## 2023-06-07 MED ORDER — LIDOCAINE 2% (20 MG/ML) 5 ML SYRINGE
INTRAMUSCULAR | Status: DC | PRN
  Administered 2023-06-07: 60 mg via INTRAVENOUS

## 2023-06-07 MED ORDER — 0.9 % SODIUM CHLORIDE (POUR BTL) OPTIME
TOPICAL | Status: DC | PRN
  Administered 2023-06-07: 1000 mL

## 2023-06-07 MED ORDER — FENTANYL CITRATE (PF) 250 MCG/5ML IJ SOLN
INTRAMUSCULAR | Status: AC
  Filled 2023-06-07: qty 5

## 2023-06-07 MED ORDER — ROCURONIUM BROMIDE 10 MG/ML (PF) SYRINGE
PREFILLED_SYRINGE | INTRAVENOUS | Status: DC | PRN
  Administered 2023-06-07: 45 mg via INTRAVENOUS

## 2023-06-07 MED ORDER — PHENYLEPHRINE 80 MCG/ML (10ML) SYRINGE FOR IV PUSH (FOR BLOOD PRESSURE SUPPORT)
PREFILLED_SYRINGE | INTRAVENOUS | Status: DC | PRN
  Administered 2023-06-07: 80 ug via INTRAVENOUS

## 2023-06-07 MED ORDER — CHLORHEXIDINE GLUCONATE 0.12 % MT SOLN
OROMUCOSAL | Status: AC
  Filled 2023-06-07: qty 15

## 2023-06-07 MED ORDER — ONDANSETRON HCL 4 MG/2ML IJ SOLN
INTRAMUSCULAR | Status: AC
  Filled 2023-06-07: qty 2

## 2023-06-07 MED ORDER — PROPOFOL 10 MG/ML IV BOLUS
INTRAVENOUS | Status: AC
  Filled 2023-06-07: qty 20

## 2023-06-07 SURGICAL SUPPLY — 48 items
BAG COUNTER SPONGE SURGICOUNT (BAG) ×1 IMPLANT
BAG URINE DRAIN 2000ML AR STRL (UROLOGICAL SUPPLIES) IMPLANT
BLADE CLIPPER SURG (BLADE) IMPLANT
BLOCK BITE 60FR ADLT L/F BLUE (MISCELLANEOUS) ×1 IMPLANT
BUTTON OLYMPUS DEFENDO 5 PIECE (MISCELLANEOUS) ×1 IMPLANT
CANISTER SUCT 3000ML PPV (MISCELLANEOUS) ×1 IMPLANT
CHLORAPREP W/TINT 26 (MISCELLANEOUS) ×1 IMPLANT
COVER SURGICAL LIGHT HANDLE (MISCELLANEOUS) ×1 IMPLANT
DERMABOND ADVANCED .7 DNX12 (GAUZE/BANDAGES/DRESSINGS) ×1 IMPLANT
ELECT REM PT RETURN 9FT ADLT (ELECTROSURGICAL) ×1 IMPLANT
ELECTRODE REM PT RTRN 9FT ADLT (ELECTROSURGICAL) ×1 IMPLANT
GAUZE SPONGE 4X4 12PLY STRL (GAUZE/BANDAGES/DRESSINGS) IMPLANT
GLOVE BIO SURGEON STRL SZ8 (GLOVE) ×1 IMPLANT
GLOVE BIOGEL PI IND STRL 7.0 (GLOVE) ×1 IMPLANT
GLOVE BIOGEL PI IND STRL 8 (GLOVE) ×1 IMPLANT
GLOVE SURG SS PI 7.0 STRL IVOR (GLOVE) ×1 IMPLANT
GOWN STRL REUS W/ TWL LRG LVL3 (GOWN DISPOSABLE) ×3 IMPLANT
GOWN STRL REUS W/ TWL XL LVL3 (GOWN DISPOSABLE) ×1 IMPLANT
GRASPER SUT TROCAR 14GX15 (MISCELLANEOUS) ×1 IMPLANT
IRRIG SUCT STRYKERFLOW 2 WTIP (MISCELLANEOUS) IMPLANT
IRRIGATION SUCT STRKRFLW 2 WTP (MISCELLANEOUS) IMPLANT
KIT BASIN OR (CUSTOM PROCEDURE TRAY) ×1 IMPLANT
KIT CLEAN ENDO COMPLIANCE (KITS) ×1 IMPLANT
KIT PG 24FR PUL EVV ENFIT (KITS) ×1 IMPLANT
KIT TURNOVER KIT B (KITS) ×1 IMPLANT
NDL 22X1.5 STRL (OR ONLY) (MISCELLANEOUS) ×1 IMPLANT
NEEDLE 22X1.5 STRL (OR ONLY) (MISCELLANEOUS) ×1 IMPLANT
NS IRRIG 1000ML POUR BTL (IV SOLUTION) ×1 IMPLANT
PAD ARMBOARD POSITIONER FOAM (MISCELLANEOUS) ×2 IMPLANT
PLUG CATH AND CAP STRL 200 (CATHETERS) IMPLANT
SCISSORS LAP 5X35 DISP (ENDOMECHANICALS) ×1 IMPLANT
SET TUBE SMOKE EVAC HIGH FLOW (TUBING) ×1 IMPLANT
SLEEVE Z-THREAD 5X100MM (TROCAR) ×3 IMPLANT
SPIKE FLUID TRANSFER (MISCELLANEOUS) ×1 IMPLANT
SUT ETHILON 2 0 FS 18 (SUTURE) IMPLANT
SUT MNCRL AB 4-0 PS2 18 (SUTURE) ×1 IMPLANT
SUT SILK 2 0 SH (SUTURE) ×4 IMPLANT
SUT SILK 2 0 SH CR/8 (SUTURE) ×1 IMPLANT
TOWEL GREEN STERILE (TOWEL DISPOSABLE) ×1 IMPLANT
TOWEL GREEN STERILE FF (TOWEL DISPOSABLE) ×1 IMPLANT
TRAY LAPAROSCOPIC MC (CUSTOM PROCEDURE TRAY) ×1 IMPLANT
TROCAR 11X100 Z THREAD (TROCAR) ×1 IMPLANT
TROCAR BALLN 12MMX100 BLUNT (TROCAR) IMPLANT
TROCAR Z-THREAD OPTICAL 5X100M (TROCAR) ×1 IMPLANT
TUBE CONNECTING 12X1/4 (SUCTIONS) ×1 IMPLANT
TUBING ENDO SMARTCAP (MISCELLANEOUS) ×1 IMPLANT
WARMER LAPAROSCOPE (MISCELLANEOUS) ×1 IMPLANT
WATER STERILE IRR 1000ML POUR (IV SOLUTION) ×1 IMPLANT

## 2023-06-07 NOTE — Progress Notes (Signed)
 [facility] PROGRESS NOTE    Progress Note  Derek Yates DOB: [birth year] DOA: 05/11/2023 PCP: [name], MD     Brief Narrative:   Derek Yates is an 83 y.o. male past medical history significant for essential hypertension, CVA recurrent UTI and dementia presented to [facility] with code stroke CT of the head showed intraparenchymal hemorrhage to the right caudate extending with intraventricular lesions, CCM consulted on 05/15/2023 for fevers for 48 hours, thought to be related to aspiration pneumonia transferred to CCM on 05/17/2023. 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  Family would like aggressive care either to proceed to [facility] or skilled nursing facility. Patient is DNR/DNI. IR was consulted and relates patient's anatomy is not amenable to percutaneous G-tube. General surgery has been consulted and evaluated the patient today to see if he is a candidate for G-tube placement for nutrition. General surgery was consulted and will get G-tube placed surgically.  Recurrent aspiration pneumonia: CT chest on 05/17/2023 pneumonia. Has completed his course of antibiotics on 06/02/2023  Acute respiratory failure with hypoxia due to aspiration pneumonia: Now been weaned to room air. Continue chest physiotherapy and inhalers.  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 Continue statins.  Acute kidney injury: Likely hemodynamic mediated resolved with IV fluid resuscitation.  Essential hypertension: Pressures well-controlled off antihypertensive medication.  Hyperlipidemia: Continue statins.  Dysphagia: Currently n.p.o. with a core track. General Surgery was consulted, will place G-tube surgically.  Advance dementia: Severe memory  loss  Protein-calorie malnutrition, severe Noted.   DVT prophylaxis: SCD's Family Communication:none Status is: Inpatient Remains inpatient appropriate because: Acute intracranial hemorrhage, okay to transfer to the floor    Code Status:     Code Status Orders  (From admission, onward)           Start     Ordered   05/17/23 1008  Do not attempt resuscitation (DNR)- Limited -Do Not Intubate (DNI)  (Code Status)  Continuous       Question Answer Comment  If pulseless and not breathing No CPR or chest compressions.   In Pre-Arrest Conditions (Patient Is Breathing and Has A Pulse) Do not intubate. Provide all appropriate non-invasive medical interventions. Avoid ICU transfer unless indicated or required.   Consent: Discussion documented in EHR or advanced directives reviewed      05/17/23 1008           Code Status History     Date Active Date Inactive Code Status Order ID Comments User Context   05/11/2023 2022 05/17/2023 1008 Full Code [id number]  [name], MD Inpatient         IV Access:   Peripheral IV   Procedures and diagnostic studies:   No results found.      Medical Consultants:   None.   Subjective:    Derek Yates no complaints this morning  Objective:    Vitals:   06/06/23 1610 06/06/23 1930 06/06/23 2328 06/07/23 0311  BP: 115/66 116/68 109/65 116/69  Pulse: 81 89 85 83  Resp: (!) 22 14 (!) 22 20  Temp: 98 F (36.7 C) 98 F (36.7 C) 97.8 F (36.6 C) 98 F (36.7 C)  TempSrc: Oral Oral Oral Oral  SpO2: 92% 95% 96% 94%  Weight:  60.8 kg  Height:       SpO2: 94 % O2 Flow Rate (L/min): 2 L/min   Intake/Output Summary (Last 24 hours) at 06/07/2023 0732 Last data filed at 06/06/2023 2329 Gross per 24 hour  Intake 60 ml  Output 700 ml  Net -640 ml   Filed Weights   06/02/23 0406 06/06/23 0335 06/07/23 0311  Weight: 63.7 kg 61.5 kg 60.8 kg    Exam: General exam: In no acute distress. Respiratory  system: Good air movement and clear to auscultation. Cardiovascular system: S1 & S2 heard, RRR. No JVD. Gastrointestinal system: Abdomen is nondistended, soft and nontender.  Extremities: No pedal edema. Skin: No rashes, lesions or ulcers  Data Reviewed:    Labs: Basic Metabolic Panel: Recent Labs  Lab 06/06/23 0622  NA 133*  K 4.4  CL 98  CO2 28  GLUCOSE 107*  BUN 25*  CREATININE 0.68  CALCIUM 9.1   GFR Estimated Creatinine Clearance: 58.1 mL/min (by C-G formula based on SCr of 0.68 mg/dL). Liver Function Tests: No results for input(s): "AST", "ALT", "ALKPHOS", "BILITOT", "PROT", "ALBUMIN" in the last 168 hours.   No results for input(s): "LIPASE", "AMYLASE" in the last 168 hours. No results for input(s): "AMMONIA" in the last 168 hours. Coagulation profile No results for input(s): "INR", "PROTIME" in the last 168 hours.  COVID-19 Labs  No results for input(s): "DDIMER", "FERRITIN", "LDH", "CRP" in the last 72 hours.  Lab Results  Component Value Date   SARSCOV2NAA Not Detected 09/14/2018    CBC: No results for input(s): "WBC", "NEUTROABS", "HGB", "HCT", "MCV", "PLT" in the last 168 hours.  Cardiac Enzymes: No results for input(s): "CKTOTAL", "CKMB", "CKMBINDEX", "TROPONINI" in the last 168 hours. BNP (last 3 results) No results for input(s): "PROBNP" in the last 8760 hours. CBG: Recent Labs  Lab 06/06/23 1607 06/06/23 1623 06/06/23 1933 06/06/23 2327 06/07/23 0310  GLUCAP 144* 151* 117* 123* 85   D-Dimer: No results for input(s): "DDIMER" in the last 72 hours. Hgb A1c: No results for input(s): "HGBA1C" in the last 72 hours. Lipid Profile: No results for input(s): "CHOL", "HDL", "LDLCALC", "TRIG", "CHOLHDL", "LDLDIRECT" in the last 72 hours. Thyroid function studies: No results for input(s): "TSH", "T4TOTAL", "T3FREE", "THYROIDAB" in the last 72 hours.  Invalid input(s): "FREET3" Anemia work up: No results for input(s): "VITAMINB12", "FOLATE",  "FERRITIN", "TIBC", "IRON", "RETICCTPCT" in the last 72 hours. Sepsis Labs: No results for input(s): "PROCALCITON", "WBC", "LATICACIDVEN" in the last 168 hours.  Microbiology No results found for this or any previous visit (from the past 240 hours).    Medications:    amLODipine  10 mg Per Tube Daily   feeding supplement (PROSource TF20)  60 mL Per Tube BID   fiber supplement (BANATROL TF)  60 mL Per Tube BID   free water  200 mL Per Tube Q4H   guaiFENesin  15 mL Per Tube Q6H   heparin injection (subcutaneous)  5,000 Units Subcutaneous Q12H   insulin aspart  0-9 Units Subcutaneous Q4H   multivitamin with minerals  1 tablet Per Tube Daily   mouth rinse  15 mL Mouth Rinse 4 times per day   pantoprazole (PROTONIX) IV  40 mg Intravenous QHS   Continuous Infusions:   ceFAZolin (ANCEF) IV     feeding supplement (OSMOLITE 1.5 CAL) Stopped (06/06/23 2358)      LOS: 27 days   Marinda Elk  [facility]  06/07/2023, 7:32 AM

## 2023-06-07 NOTE — Transfer of Care (Signed)
 Immediate Anesthesia Transfer of Care Note  Patient: Derek Yates  Procedure(s) Performed: INSERTION, PEG TUBE CREATION, GASTROSTOMY, LAPAROSCOPIC  Patient Location: PACU  Anesthesia Type:General  Level of Consciousness: awake, alert , and oriented  Airway & Oxygen Therapy: Patient Spontanous Breathing  Post-op Assessment: Report given to RN, Post -op Vital signs reviewed and stable, Patient moving all extremities X 4, and Patient able to stick tongue midline  Post vital signs: Reviewed and stable  Last Vitals:  Vitals Value Taken Time  BP 125/63   Temp 97.7   Pulse 90   Resp 21   SpO2 94     Last Pain:  Vitals:   06/07/23 0827  TempSrc: Oral  PainSc:       Patients Stated Pain Goal: 0 (06/06/23 0801)  Complications: No notable events documented.

## 2023-06-07 NOTE — Anesthesia Procedure Notes (Signed)
 Procedure Name: Intubation Date/Time: 06/07/2023 9:09 AM  Performed by: Cy Blamer, CRNAPre-anesthesia Checklist: Patient identified, Emergency Drugs available, Suction available and Patient being monitored Patient Re-evaluated:Patient Re-evaluated prior to induction Oxygen Delivery Method: Circle system utilized Preoxygenation: Pre-oxygenation with 100% oxygen Induction Type: IV induction and Rapid sequence Laryngoscope Size: Miller and 2 Grade View: Grade I Tube type: Oral Tube size: 7.0 mm Number of attempts: 1 Airway Equipment and Method: Stylet Placement Confirmation: ETT inserted through vocal cords under direct vision, positive ETCO2 and breath sounds checked- equal and bilateral Secured at: 21 cm Tube secured with: Tape Dental Injury: Teeth and Oropharynx as per pre-operative assessment  Comments: RSI for nasal feeding tube

## 2023-06-07 NOTE — Anesthesia Postprocedure Evaluation (Signed)
 Anesthesia Post Note  Patient: Derek Yates  Procedure(s) Performed: INSERTION, PEG TUBE CREATION, GASTROSTOMY, LAPAROSCOPIC     Patient location during evaluation: PACU Anesthesia Type: General Level of consciousness: awake and alert Pain management: pain level controlled Vital Signs Assessment: post-procedure vital signs reviewed and stable Respiratory status: spontaneous breathing, nonlabored ventilation, respiratory function stable and patient connected to nasal cannula oxygen Cardiovascular status: blood pressure returned to baseline and stable Postop Assessment: no apparent nausea or vomiting Anesthetic complications: no   No notable events documented.  Last Vitals:  Vitals:   06/07/23 0827 06/07/23 0947  BP: (!) 119/56 125/63  Pulse: 75 85  Resp: 20 20  Temp: 36.6 C 36.5 C  SpO2: 94% 94%    Last Pain:  Vitals:   06/07/23 0947  TempSrc:   PainSc: Asleep                 Irbin Fines

## 2023-06-07 NOTE — Plan of Care (Signed)
  Problem: Education: Goal: Knowledge of General Education information will improve Description: Including pain rating scale, medication(s)/side effects and non-pharmacologic comfort measures Outcome: Progressing   Problem: Health Behavior/Discharge Planning: Goal: Ability to manage health-related needs will improve Outcome: Progressing   Problem: Clinical Measurements: Goal: Ability to maintain clinical measurements within normal limits will improve Outcome: Progressing Goal: Will remain free from infection Outcome: Progressing Goal: Diagnostic test results will improve Outcome: Progressing Goal: Respiratory complications will improve Outcome: Progressing Goal: Cardiovascular complication will be avoided Outcome: Progressing   Problem: Activity: Goal: Risk for activity intolerance will decrease Outcome: Progressing   Problem: Nutrition: Goal: Adequate nutrition will be maintained Outcome: Progressing   Problem: Coping: Goal: Level of anxiety will decrease Outcome: Progressing   Problem: Elimination: Goal: Will not experience complications related to bowel motility Outcome: Progressing Goal: Will not experience complications related to urinary retention Outcome: Progressing   Problem: Pain Managment: Goal: General experience of comfort will improve and/or be controlled Outcome: Progressing   Problem: Safety: Goal: Ability to remain free from injury will improve Outcome: Progressing   Problem: Skin Integrity: Goal: Risk for impaired skin integrity will decrease Outcome: Progressing   Problem: Education: Goal: Knowledge of disease or condition will improve Outcome: Progressing Goal: Knowledge of secondary prevention will improve (MUST DOCUMENT ALL) Outcome: Progressing Goal: Knowledge of patient specific risk factors will improve (DELETE if not current risk factor) Outcome: Progressing   Problem: Intracerebral Hemorrhage Tissue Perfusion: Goal: Complications  of Intracerebral Hemorrhage will be minimized Outcome: Progressing   Problem: Coping: Goal: Will verbalize positive feelings about self Outcome: Progressing Goal: Will identify appropriate support needs Outcome: Progressing   Problem: Health Behavior/Discharge Planning: Goal: Ability to manage health-related needs will improve Outcome: Progressing Goal: Goals will be collaboratively established with patient/family Outcome: Progressing   Problem: Self-Care: Goal: Ability to participate in self-care as condition permits will improve Outcome: Progressing Goal: Verbalization of feelings and concerns over difficulty with self-care will improve Outcome: Progressing Goal: Ability to communicate needs accurately will improve Outcome: Progressing   Problem: Nutrition: Goal: Risk of aspiration will decrease Outcome: Progressing Goal: Dietary intake will improve Outcome: Progressing   Problem: Education: Goal: Ability to describe self-care measures that may prevent or decrease complications (Diabetes Survival Skills Education) will improve Outcome: Progressing   Problem: Coping: Goal: Ability to adjust to condition or change in health will improve Outcome: Progressing   Problem: Fluid Volume: Goal: Ability to maintain a balanced intake and output will improve Outcome: Progressing   Problem: Health Behavior/Discharge Planning: Goal: Ability to identify and utilize available resources and services will improve Outcome: Progressing Goal: Ability to manage health-related needs will improve Outcome: Progressing   Problem: Metabolic: Goal: Ability to maintain appropriate glucose levels will improve Outcome: Progressing   Problem: Nutritional: Goal: Maintenance of adequate nutrition will improve Outcome: Progressing Goal: Progress toward achieving an optimal weight will improve Outcome: Progressing   Problem: Skin Integrity: Goal: Risk for impaired skin integrity will  decrease Outcome: Progressing   Problem: Tissue Perfusion: Goal: Adequacy of tissue perfusion will improve Outcome: Progressing

## 2023-06-07 NOTE — Progress Notes (Signed)
 Pre Procedure note for inpatients:   Derek Yates has been scheduled for Procedure(s) with comments: INSERTION, PEG TUBE (N/A) - POSSIBLE GASTROSTOMY CREATION, GASTROSTOMY, LAPAROSCOPIC (N/A) today. The various methods of treatment have been discussed with the patient. After consideration of the risks, benefits and treatment options the patient has consented to the planned procedure.   The patient has been seen and labs reviewed. There are no changes in the patient's condition to prevent proceeding with the planned procedure today.  Recent labs:  Lab Results  Component Value Date   WBC 6.6 05/31/2023   HGB 10.2 (L) 05/31/2023   HCT 29.3 (L) 05/31/2023   PLT 364 05/31/2023   GLUCOSE 107 (H) 06/06/2023   CHOL 134 05/13/2023   TRIG 77 05/13/2023   HDL 50 05/13/2023   LDLCALC 69 05/13/2023   ALT 21 05/31/2023   AST 18 05/31/2023   NA 133 (L) 06/06/2023   K 4.4 06/06/2023   CL 98 06/06/2023   CREATININE 0.68 06/06/2023   BUN 25 (H) 06/06/2023   CO2 28 06/06/2023   INR 1.0 05/11/2023   HGBA1C 5.7 (H) 05/13/2023    Rodman Pickle, MD 06/07/2023 8:31 AM

## 2023-06-07 NOTE — Op Note (Signed)
 Preoperative diagnosis: dysphagia  Postoperative diagnosis: same   Procedure: endoscopic placement of gastrostomy by pull technique  Surgeon: Feliciana Rossetti, M.D.  Asst: Trixie Deis, [employer]  Anesthesia: general  Indications for procedure: Derek Yates is a 83 y.o. 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  Description of procedure: The patient was brought into the operative suite. Anesthesia was administered with General endotracheal anesthesia. WHO checklist was applied. The patient was then placed in supine position. The area was prepped and draped in the usual sterile fashion.  Next, the endoscope was intubated into the mouth and slowly advanced though the pharynx and esophagus. The stomach was identified. Transillumination was used to identify a place in the distal stomach area of the abdomen.  An incision was made at the point of transillumination. A 14ga needle was used to gain access to the stomach in the distal body. A looped wire was introduced and ensured by endoscopic snare. The looped wire was secured to he 24 fr gastrostomy tube and brought back through the mouth and esophagus and out the skin incision. The endoscope was reinserted to confirm the mushroom was against the stomach and abdominal wall. The wafer was secured at 3 cm. The patient tolerated the procedure well. All counts were correct.  Findings: 24 Fr g tube in the   Specimen: none  Implant: 24 fr gastrostomy tube   Blood loss: 10 ml  Local anesthesia: 5 ml Marcaine  Complications: none  Feliciana Rossetti, M.D. General, Bariatric, & Minimally Invasive Surgery [facility]

## 2023-06-07 NOTE — Anesthesia Preprocedure Evaluation (Addendum)
 Anesthesia Evaluation  Patient identified by MRN, date of birth, ID band Patient awake    Reviewed: Allergy & Precautions, H&P , NPO status , [name] Chart, lab work & pertinent test results  Airway Mallampati: II  TM Distance: >3 FB Neck ROM: Full    Dental no notable dental hx.    Pulmonary neg pulmonary ROS   Pulmonary exam normal breath sounds clear to auscultation       Cardiovascular Exercise Tolerance: Good hypertension, Normal cardiovascular exam Rhythm:Regular Rate:Normal  ECHO 2/25  1. Left ventricular ejection fraction, by estimation, is 65 to 70%. The  left ventricle has normal function. The left ventricle has no regional  wall motion abnormalities. There is mild left ventricular hypertrophy.  Left ventricular diastolic parameters  are consistent with Grade I diastolic dysfunction (impaired relaxation).   2. Right ventricular systolic function is normal. The right ventricular  size is normal. Tricuspid regurgitation signal is inadequate for assessing  PA pressure.   3. The mitral valve is degenerative. Trivial mitral valve regurgitation.  No evidence of mitral stenosis.   4. The aortic valve is abnormal. There is moderate calcification of the  aortic valve. Aortic valve regurgitation is not visualized. Aortic valve  sclerosis/calcification is present, without any evidence of aortic  stenosis.   5. The inferior vena cava is normal in size with greater than 50%  respiratory variability, suggesting right atrial pressure of 3 mmHg.   6. Cannot exclude a small PFO.      Neuro/Psych  PSYCHIATRIC DISORDERS     Dementia CVA, Residual Symptoms negative neurological ROS  negative psych ROS   GI/Hepatic negative GI ROS, Neg liver ROS,,,  Endo/Other  negative endocrine ROS    Renal/GU negative Renal ROS  negative genitourinary   Musculoskeletal negative musculoskeletal ROS (+)    Abdominal   Peds negative  pediatric ROS (+)  Hematology negative hematology ROS (+) Blood dyscrasia, anemia   Anesthesia Other Findings   Reproductive/Obstetrics negative OB ROS                             Anesthesia Physical Anesthesia Plan  ASA: 4  Anesthesia Plan: General   Post-op Pain Management: Minimal or no pain anticipated and Ofirmev IV (intra-op)*   Induction: Intravenous  PONV Risk Score and Plan: 2 and Ondansetron, Dexamethasone and Treatment may vary due to age or medical condition  Airway Management Planned: Oral ETT  Additional Equipment: None  Intra-op Plan:   Post-operative Plan: Extubation in OR  Informed Consent: I have reviewed the patients History and Physical, chart, labs and discussed the procedure including the risks, benefits and alternatives for the proposed anesthesia with the patient or authorized representative who has indicated his/her understanding and acceptance.    Discussed DNR with power of attorney and Continue DNR.     Plan Discussed with: Anesthesiologist and CRNA  Anesthesia Plan Comments: (Discussion with POA regarding CPR.  POA is ok with cardiac drugs, Intubation but NO chest compressions.    )        Anesthesia Quick Evaluation

## 2023-06-07 NOTE — Progress Notes (Signed)
 Nutrition Follow-up  DOCUMENTATION CODES:   Severe malnutrition in context of chronic illness  INTERVENTION:  Re-initiate TF regimen to be administered via g-tube: Osmolite 1.5 at 40 ml/h (960 ml per day) Start at rate of 69ml/hr and advance by 52ml/hr Q8 hours until goal rate achieved Prosource TF20 60 ml BID FWF added per MD - Q4 hours   Provides 1600 kcal, 100 gm protein, 1929 ml free water daily   MVI with minerals daily via tube  Banatrol BID, each packet provides 5g soluble fiber Monitor tolerance to modified TF administration route Once tolerance achieved via new route of administration, will recommend transitioning to boluses or daytime feedings to allow patient to sleep   NUTRITION DIAGNOSIS:  Severe Malnutrition related to chronic illness as evidenced by severe fat depletion, severe muscle depletion. 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  2/19 admitted w/ R caudate bleed 2/21 - s/p cortrak placement; tip gastric  2/22 - bedside swallow: NPO, CT head, CXR 2/25 - CT of chest: PNA 2/26 - transfer to 4NP 2/28 - FMS and flatus pouch removed 3/7 - unable to complete MBSS 3/10- MBSS: decline in function 3/11 - swallow edu completed w/ patient's son 3/12 - referral made to residential hospice 3/13 - family would like to pursue PEG. LTACH placement 3/14 - IR: pt not candidate for PEG 3/18 - G-tube placement per surgery, re-initiate TFs   Pt had g-tube placed by surgery today. Pt tolerated well. Recommend re-initiating TF regimen at goal rate of 92ml/hr, as he was tolerating via Cortrak. Spoke with surgery PA via secure  chat. Since placement was today, will initiate at 27ml/hr and increase by 31ml/hr Q8 hours until goal rate achieved.    Once tolerance achieved, recommend increased goal rate to be run across 16 hours versus continuously to allow TF to be off at night. Can also be transitioned to boluses, if indicated/preferred.  Admit Weight: 61.9kg Current Weight: 60.8kg Lowest Weight: 58.8kg on 2/25  Continues to show little to no progress. Family has opted for g-tube placement and hopeful for discharge to LTAC. Weight remains stable. No significant changes to bowel frequency or emesis documented. Abdomen remains soft and nondistended. No edema noted.   Meds: SSI 0-9 Q4, MVI, pantoprazole, Banatrol   Labs: no new labs today, reviewed from 3/17 Na+ 137>135>133 (L) K+ 4.4 (wdl) CBGs 107 x1 draw in last week   Diet Order:   Diet Order             Diet NPO time specified Except for: Other (See Comments)  Diet effective now            EDUCATION NEEDS:  Not appropriate for education at this time  Skin:  Skin Assessment: Reviewed RN Assessment  Last BM:  3/18  Height:  Ht Readings from Last 1 Encounters:  06/07/23 5\' 8"  (1.727 m)   Weight:  Wt Readings from Last 1 Encounters:  06/07/23 60.8 kg   Ideal Body Weight:  70 kg  BMI:  Body mass index is 20.38 kg/m.  Estimated Nutritional Needs:   Kcal:  1600-1800  Protein:  90-100 grams  Fluid:  >1.6 L/day  Myrtie Cruise MS, RD, LDN Registered Dietitian Clinical Nutrition RD Inpatient Contact Info in Amion

## 2023-06-07 NOTE — Progress Notes (Signed)
 SLP Cancellation Note  Patient Details Name: KYRIE FLUDD MRN: [id number] DOB: March 13, 1941   Cancelled treatment:       Reason Eval/Treat Not Completed: Patient at procedure or test/unavailable. Pt in OR for PEG placement. SLP will continue following.    Gwynneth Aliment, M.A., CF-SLP Speech Language Pathology, Acute Rehabilitation Services  Secure Chat preferred [phone]  06/07/2023, 9:08 AM

## 2023-06-07 NOTE — Progress Notes (Signed)
 OT Cancellation Note  Patient Details Name: Derek Yates MRN: [id number] DOB: Jul 22, 1940   Cancelled Treatment:    Reason Eval/Treat Not Completed: Patient at procedure or test/ unavailable (Pt at OR will return as schedule allows and pt medically ready.) Pt in OR for PEG tube placement.   Tyler Deis, OTR/L [facility] Acute Rehabilitation Office: [phone]   Myrla Halsted 06/07/2023, 9:03 AM

## 2023-06-08 ENCOUNTER — Encounter (HOSPITAL_COMMUNITY): Payer: Self-pay | Admitting: General Surgery

## 2023-06-08 DIAGNOSIS — R1319 Other dysphagia: Secondary | ICD-10-CM | POA: Diagnosis not present

## 2023-06-08 DIAGNOSIS — F03A0 Unspecified dementia, mild, without behavioral disturbance, psychotic disturbance, mood disturbance, and anxiety: Secondary | ICD-10-CM | POA: Diagnosis not present

## 2023-06-08 DIAGNOSIS — I6389 Other cerebral infarction: Secondary | ICD-10-CM | POA: Diagnosis not present

## 2023-06-08 DIAGNOSIS — J690 Pneumonitis due to inhalation of food and vomit: Secondary | ICD-10-CM | POA: Diagnosis not present

## 2023-06-08 LAB — COMPREHENSIVE METABOLIC PANEL
ALT: 19 U/L (ref 0–44)
AST: 19 U/L (ref 15–41)
Albumin: 2.6 g/dL — ABNORMAL LOW (ref 3.5–5.0)
Alkaline Phosphatase: 60 U/L (ref 38–126)
Anion gap: 9 (ref 5–15)
BUN: 25 mg/dL — ABNORMAL HIGH (ref 8–23)
CO2: 29 mmol/L (ref 22–32)
Calcium: 9.3 mg/dL (ref 8.9–10.3)
Chloride: 97 mmol/L — ABNORMAL LOW (ref 98–111)
Creatinine, Ser: 0.8 mg/dL (ref 0.61–1.24)
GFR, Estimated: >60 mL/min (ref >60)
Glucose, Bld: 126 mg/dL — ABNORMAL HIGH (ref 70–99)
Potassium: 4.3 mmol/L (ref 3.5–5.1)
Sodium: 135 mmol/L (ref 135–145)
Total Bilirubin: 0.6 mg/dL (ref 0.0–1.2)
Total Protein: 6.8 g/dL (ref 6.5–8.1)

## 2023-06-08 LAB — GLUCOSE, CAPILLARY
Glucose-Capillary: 120 mg/dL — ABNORMAL HIGH (ref 70–99)
Glucose-Capillary: 153 mg/dL — ABNORMAL HIGH (ref 70–99)
Glucose-Capillary: 160 mg/dL — ABNORMAL HIGH (ref 70–99)
Glucose-Capillary: 151 mg/dL — ABNORMAL HIGH (ref 70–99)
Glucose-Capillary: 147 mg/dL — ABNORMAL HIGH (ref 70–99)
Glucose-Capillary: 132 mg/dL — ABNORMAL HIGH (ref 70–99)

## 2023-06-08 LAB — MAGNESIUM: Magnesium: 2.2 mg/dL (ref 1.7–2.4)

## 2023-06-08 LAB — PHOSPHORUS: Phosphorus: 4.2 mg/dL (ref 2.5–4.6)

## 2023-06-08 MED ORDER — NAPHAZOLINE-PHENIRAMINE 0.025-0.3 % OP SOLN: 1.0000 [drp] | Freq: Four times a day (QID) | OPHTHALMIC | Status: DC | PRN

## 2023-06-08 NOTE — Progress Notes (Signed)
 Speech Language Pathology Treatment: Dysphagia  Patient Details Name: Derek Yates MRN: [id number] DOB: February 01, 1941 Today's Date: 06/08/2023 Time: 2952-8413 SLP Time Calculation (min) (ACUTE ONLY): 10 min  Assessment / Plan / Recommendation Clinical Impression  Pt is progressing in terms of sustained level of alertness this date. He verbalized wants/needs, telling his wife he wants pizza. Oral care was provided. Pt accepted sips of water via tspn and cup without overt s/s of aspiration. He initiated prompt oral transit with minimal anterior spillage. If pt remains this alert and participatory, feel he is ready to repeat an MBS. Discussed plan with pt's wife and RN, who are in agreement. SLP will f/u with RN next date before proceeding.   HPI HPI: Patient is an 83 y.o. male with PMH: dementia, recurrent UTI's, CVA, HTN. He presented to the hospital on 05/11/23 as a code stroke with speech deficits and left sided weakness. 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 Patient has been NPO; Cortrak placed 2/21. He failed [facility] swallow screen with RN.      SLP Plan  MBS      Recommendations for follow up therapy are one component of a multi-disciplinary discharge planning process, led by the attending physician.  Recommendations may be updated based on patient status, additional functional criteria and insurance authorization.    Recommendations  Diet recommendations: NPO Medication Administration: Via alternative means                  Oral care QID;Oral care prior to ice chip/H20;Staff/trained caregiver to provide oral care   Frequent or constant Supervision/Assistance Dysphagia, oropharyngeal phase (R13.12)     MBS     Gwynneth Aliment, M.A., CF-SLP Speech Language Pathology, Acute Rehabilitation Services  Secure Chat preferred [phone]   06/08/2023, 3:32  PM

## 2023-06-08 NOTE — Progress Notes (Signed)
 Progress Note  1 Day Post-Op  Subjective: Pt denies significant abdominal pain. Tolerating TF at goal.   Objective: Vital signs in last 24 hours: Temp:  [97 F (36.1 C)-98.8 F (37.1 C)] 98.4 F (36.9 C) (03/19 0305) Pulse Rate:  [78-100] 89 (03/19 0305) Resp:  [15-24] 20 (03/19 0305) BP: (105-127)/(59-72) 107/59 (03/19 0305) SpO2:  [91 %-97 %] 93 % (03/19 0305) Weight:  [61 kg] 61 kg (03/19 0305) Last BM Date : 06/05/23  Intake/Output from previous day: 03/18 0701 - 03/19 0700 In: 4772.4 [I.V.:400; MW/UX:3244.0; IV Piggyback:279] Out: 1505 [Urine:1500; Blood:5] Intake/Output this shift: No intake/output data recorded.  PE: General: pleasant, WD, elderly male who is laying in bed in NAD Heart: regular, rate, and rhythm.   Lungs: Respiratory effort nonlabored Abd: soft, NT, ND, PEG in place without drainage or bleeding, binder present  Psych: A&Ox3 with an appropriate affect.    Lab Results:  No results for input(s): "WBC", "HGB", "HCT", "PLT" in the last 72 hours. BMET Recent Labs    06/06/23 0622  NA 133*  K 4.4  CL 98  CO2 28  GLUCOSE 107*  BUN 25*  CREATININE 0.68  CALCIUM 9.1   PT/INR No results for input(s): "LABPROT", "INR" in the last 72 hours. CMP     Component Value Date/Time   NA 133 (L) 06/06/2023 0622   NA 143 02/03/2023 0852   K 4.4 06/06/2023 0622   CL 98 06/06/2023 0622   CO2 28 06/06/2023 0622   GLUCOSE 107 (H) 06/06/2023 0622   BUN 25 (H) 06/06/2023 0622   BUN 15 02/03/2023 0852   CREATININE 0.68 06/06/2023 0622   CALCIUM 9.1 06/06/2023 0622   PROT 6.1 (L) 05/31/2023 0602   PROT 7.4 02/03/2023 0852   ALBUMIN 2.3 (L) 05/31/2023 0602   ALBUMIN 4.5 02/03/2023 0852   AST 18 05/31/2023 0602   ALT 21 05/31/2023 0602   ALKPHOS 45 05/31/2023 0602   BILITOT 0.6 05/31/2023 0602   BILITOT 0.5 02/03/2023 0852   GFRNONAA >60 06/06/2023 0622   Lipase  No results found for: "LIPASE"     Studies/Results: No results  found.  Anti-infectives: Anti-infectives (From admission, onward)    Start     Dose/Rate Route Frequency Ordered Stop   06/08/23 1600  fluconazole (DIFLUCAN) IVPB 100 mg        100 mg 50 mL/hr over 60 Minutes Intravenous Every 24 hours 06/07/23 1457     06/07/23 1545  fluconazole (DIFLUCAN) IVPB 200 mg        200 mg 100 mL/hr over 60 Minutes Intravenous  Once 06/07/23 1457 06/07/23 1812   06/07/23 0600  ceFAZolin (ANCEF) IVPB 2g/100 mL premix        2 g 200 mL/hr over 30 Minutes Intravenous  Once 06/06/23 1423 06/07/23 0940   06/02/23 2200  metroNIDAZOLE (FLAGYL) IVPB 500 mg        500 mg 100 mL/hr over 60 Minutes Intravenous Every 12 hours 06/02/23 0908 06/02/23 2246   06/02/23 1400  ceFEPIme (MAXIPIME) 2 g in sodium chloride 0.9 % 100 mL IVPB        2 g 200 mL/hr over 30 Minutes Intravenous Every 8 hours 06/02/23 0908 06/02/23 1454   05/23/23 1130  metroNIDAZOLE (FLAGYL) IVPB 500 mg  Status:  Discontinued        500 mg 100 mL/hr over 60 Minutes Intravenous Every 12 hours 05/23/23 1033 06/02/23 0908   05/22/23 1330  ceFEPIme (MAXIPIME) 2 g  in sodium chloride 0.9 % 100 mL IVPB  Status:  Discontinued        2 g 200 mL/hr over 30 Minutes Intravenous Every 8 hours 05/22/23 1257 06/02/23 0908   05/20/23 1415  amoxicillin-clavulanate (AUGMENTIN) 400-57 MG/5ML suspension 500 mg  Status:  Discontinued        500 mg Per Tube Every 8 hours 05/20/23 1323 05/22/23 1257   05/20/23 1000  amoxicillin-clavulanate (AUGMENTIN) 875-125 MG per tablet 1 tablet  Status:  Discontinued        1 tablet Oral Every 12 hours 05/20/23 0716 05/20/23 0723   05/20/23 0815  amoxicillin-clavulanate (AUGMENTIN) 250-62.5 MG/5ML suspension 500 mg  Status:  Discontinued        500 mg Per Tube Every 8 hours 05/20/23 0723 05/20/23 1323   05/17/23 1400  metroNIDAZOLE (FLAGYL) IVPB 500 mg  Status:  Discontinued        500 mg 100 mL/hr over 60 Minutes Intravenous Every 12 hours 05/17/23 1322 05/20/23 0716   05/17/23 1130   linezolid (ZYVOX) IVPB 600 mg  Status:  Discontinued        600 mg 300 mL/hr over 60 Minutes Intravenous Every 12 hours 05/17/23 1043 05/17/23 1322   05/16/23 1100  ceFEPIme (MAXIPIME) 2 g in sodium chloride 0.9 % 100 mL IVPB  Status:  Discontinued        2 g 200 mL/hr over 30 Minutes Intravenous Every 12 hours 05/16/23 1012 05/20/23 0716   05/15/23 1400  cefTRIAXone (ROCEPHIN) 2 g in sodium chloride 0.9 % 100 mL IVPB  Status:  Discontinued        2 g 200 mL/hr over 30 Minutes Intravenous Every 24 hours 05/15/23 1313 05/16/23 1012   05/15/23 1345  cefTRIAXone (ROCEPHIN) 1 g in sodium chloride 0.9 % 100 mL IVPB  Status:  Discontinued        1 g 200 mL/hr over 30 Minutes Intravenous Every 24 hours 05/15/23 1254 05/15/23 1313   05/11/23 2230  cefTRIAXone (ROCEPHIN) 1 g in sodium chloride 0.9 % 100 mL IVPB        1 g 200 mL/hr over 30 Minutes Intravenous Daily at bedtime 05/11/23 2153 05/12/23 2209        Assessment/Plan  Dysphagia S/P PEG placement 3/18 - tolerating TF at goal - PEG site clean without any active bleeding - absorbable sutures will fall out on their own over the next few weeks - fine to transition to cyclic or bolus feeds per primary team - general surgery will sign off at this time      LOS: 28 days    Juliet Rude, [facility] Surgery 06/08/2023, 8:45 AM Please see Amion for pager number during day hours 7:00am-4:30pm

## 2023-06-08 NOTE — Progress Notes (Signed)
 Occupational Therapy Treatment Patient Details Name: Derek Yates MRN: [id number] DOB: 18-Nov-1940 Today's Date: 06/08/2023   History of present illness 82 yo male presents to [facility] on 2/19 as code stroke for speech deficits, L weakness. 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 PEG tube placement on 06/07/2023. PMH includes dementia, recurrent UTIs, CVA, HTN.   OT comments  Pt is making fair progress towards acute OT goals. Pt continues to be limited by deficits listed below. Pt with increased alertness, responded to one-step verbal commands 90% of session with increased time with additional initiation of cueing to L side, and was able to verbalize feeling fatigued at the end of session. Pt required MAX A +2 for bed mobility tasks. Initially, pt required MAX A to maintain balance due to L lateral lean but was able to progress to supervision briefly after positioning, weight shifting, lateral leans EOB. Pt completed BLE AROM exercises seated EOB, 1 set of 5-10 reps. OT to continue following pt acutely to address functional needs with discharge recommendations of LTACH.       If plan is discharge home, recommend the following:  Two people to help with walking and/or transfers;A lot of help with bathing/dressing/bathroom;Assistance with cooking/housework;Assist for transportation;Supervision due to cognitive status   Equipment Recommendations  Other (comment) (defer)    Recommendations for Other Services      Precautions / Restrictions Precautions Precautions: Fall Recall of Precautions/Restrictions: Impaired Precaution/Restrictions Comments: PEG tube; abdominal binder Restrictions Weight Bearing Restrictions Per Provider Order: No       Mobility Bed Mobility Overal bed mobility: Needs Assistance Bed Mobility: Supine to Sit, Sit to Supine     Supine to sit: Max assist, +2 for physical assistance, Used rails Sit to supine: Max assist, +2 for physical  assistance, Used rails   General bed mobility comments: Pt with good initiation with moving RLE OOB and crossing midline with RUE to grasp bed rail. Pt required verbal/tactile cues for sequencing and assistance elevating trunk and scooting forward until B feet were flat on floor    Transfers Overall transfer level: Needs assistance                 General transfer comment: Pt reported increased fatigue. DNT for safety     Balance Overall balance assessment: Needs assistance Sitting-balance support: Bilateral upper extremity supported, Feet supported Sitting balance-Leahy Scale: Poor Sitting balance - Comments: Pt initially leaning to left but after stretching and positioning EOB, pt able to progress to supervision                                   ADL either performed or assessed with clinical judgement   ADL Overall ADL's : Needs assistance/impaired                                     Functional mobility during ADLs: Maximal assistance;+2 for physical assistance General ADL Comments: Pt required step-by-step verbal cues for sequencing of bed mobility tasks. Pt with generalized weakness but great initiation with movement of RLE OOB.    Extremity/Trunk Assessment Upper Extremity Assessment Upper Extremity Assessment: Generalized weakness (Pt with ~) RUE Deficits / Details: Able to hold UE against gravity >5 seconds, 0-140 active shoulder flexion in supine, decreased grip strength RUE Coordination: decreased fine motor;decreased gross motor  LUE Deficits / Details: decreased grip strength, shoulder flexion AROM 0-~100 degrees in supine LUE Coordination: decreased fine motor;decreased gross motor   Lower Extremity Assessment Lower Extremity Assessment: Defer to PT evaluation        Vision   Vision Assessment?: Vision impaired- to be further tested in functional context Additional Comments: Pt with preference for L head turn/gaze and L  downward gaze. Able to scan to midline to find therapist but not pass midline   Perception Perception Perception: Impaired Preception Impairment Details: Inattention/Neglect Perception-Other Comments: R visual inattention   Praxis Praxis Praxis: Not tested   Communication Communication Communication: Impaired Factors Affecting Communication: Difficulty expressing self;Reduced clarity of speech   Cognition Arousal: Alert Behavior During Therapy: Flat affect Cognition: History of cognitive impairments             OT - Cognition Comments: Pt with increased alertness on this date. Pt followed 1 step verbal commands ~90% of session with increased time for processing.                 Following commands: Impaired Following commands impaired: Follows one step commands with increased time, Follows one step commands inconsistently      Cueing   Cueing Techniques: Verbal cues, Tactile cues  Exercises Exercises: Other exercises, General Lower Extremity General Exercises - Lower Extremity Long Arc Quad: AROM, Both, 5 reps, Seated Hip Flexion/Marching: AROM, 10 reps, Seated, Both Other Exercises Other Exercises: Pt initially MAX A EOB with L posterior lean. Facilitating midline orientation and upright posture with lateral leans EOB in addition to anterior, posterior weight shifiting with significant OT facilitation. After, pt balance progressed to CGA-supervision    Shoulder Instructions       General Comments VSS    Pertinent Vitals/ Pain       Pain Assessment Pain Assessment: No/denies pain  Home Living                                          Prior Functioning/Environment              Frequency  Min 1X/week        Progress Toward Goals  OT Goals(current goals can now be found in the care plan section)  Progress towards OT goals: Progressing toward goals  Acute Rehab OT Goals OT Goal Formulation: Patient unable to participate in goal  setting Time For Goal Achievement: 06/17/23 Potential to Achieve Goals: Fair ADL Goals Pt Will Perform Grooming: with min assist;sitting Pt Will Perform Upper Body Bathing: with mod assist;sitting Pt Will Perform Upper Body Dressing: with mod assist;sitting Pt Will Transfer to Toilet: with min assist;with +2 assist;bedside commode Pt/caregiver will Perform Home Exercise Program: Increased strength;Both right and left upper extremity;With minimal assist Additional ADL Goal #1: Patient will sit EOB with supervision for up to 10 min to increase independence with seated ADL and grooming.  Plan      Co-evaluation                 AM-PAC OT "6 Clicks" Daily Activity     Outcome Measure   Help from another person eating meals?: Total Help from another person taking care of personal grooming?: Total Help from another person toileting, which includes using toliet, bedpan, or urinal?: Total Help from another person bathing (including washing, rinsing, drying)?: Total Help from another person to put on and taking  off regular upper body clothing?: Total Help from another person to put on and taking off regular lower body clothing?: Total 6 Click Score: 6    End of Session    OT Visit Diagnosis: Unsteadiness on feet (R26.81);Other symptoms and signs involving cognitive function;Muscle weakness (generalized) (M62.81)   Activity Tolerance Patient tolerated treatment well   Patient Left in bed;with bed alarm set;with call bell/phone within reach;with family/visitor present   Nurse Communication Mobility status        Time: 8295-6213 OT Time Calculation (min): 37 min  Charges: OT General Charges $OT Visit: 1 Visit OT Treatments $Therapeutic Activity: 23-37 mins  [address] Darliss Cheney 06/08/2023, 2:32 PM

## 2023-06-08 NOTE — Progress Notes (Signed)
 [facility] PROGRESS NOTE    Progress Note  Derek Yates  ZOX:[id number] DOB: May 22, 1940 DOA: 05/11/2023 PCP: [name], MD     Brief Narrative:   Derek Yates is an 83 y.o. male past medical history significant for essential hypertension, CVA recurrent UTI and dementia presented to [facility] with code stroke CT of the head showed intraparenchymal hemorrhage to the right caudate extending with intraventricular lesions, CCM consulted on 05/15/2023 for fevers for 48 hours, thought to be related to aspiration pneumonia transferred to CCM on 05/17/2023. 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  Family would like aggressive care either to proceed to [facility] or skilled nursing facility. Patient is DNR/DNI. IR was consulted and relates patient's anatomy is not amenable to percutaneous G-tube. General surgery has been consulted and evaluated the patient today to see if he is a candidate for G-tube placement for nutrition. General surgery was consulted and will get G-tube placed surgically. TOC has been consulted and is, will need skilled nursing facility placement.  Recurrent aspiration pneumonia: CT chest on 05/17/2023 pneumonia. Has completed his course of antibiotics on 06/02/2023. PEG tube has been placed  Acute respiratory failure with hypoxia due to aspiration pneumonia: Now been weaned to room air.  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 Continue statins.  Acute kidney injury: Likely hemodynamic mediated resolved with IV fluid resuscitation.  Essential hypertension: Pressures well-controlled off antihypertensive medication.  Hyperlipidemia: Continue statins.  Dysphagia: Currently n.p.o. core track has been discontinued. General Surgery was consulted, will  place G-tube surgically.  Advance dementia: Severe memory loss  Protein-calorie malnutrition, severe Noted. Now with a G-tube nutrition recommended Osmolite Prosource twice a day.   DVT prophylaxis: SCD's Family Communication:none Status is: Inpatient Remains inpatient appropriate because: Acute intracranial hemorrhage, okay to transfer to the floor    Code Status:     Code Status Orders  (From admission, onward)           Start     Ordered   05/17/23 1008  Do not attempt resuscitation (DNR)- Limited -Do Not Intubate (DNI)  (Code Status)  Continuous       Question Answer Comment  If pulseless and not breathing No CPR or chest compressions.   In Pre-Arrest Conditions (Patient Is Breathing and Has A Pulse) Do not intubate. Provide all appropriate non-invasive medical interventions. Avoid ICU transfer unless indicated or required.   Consent: Discussion documented in EHR or advanced directives reviewed      05/17/23 1008           Code Status History     Date Active Date Inactive Code Status Order ID Comments User Context   05/11/2023 2022 05/17/2023 1008 Full Code [id number]  [name], MD Inpatient         IV Access:   Peripheral IV   Procedures and diagnostic studies:   No results found.  Medical Consultants:   None.   Subjective:    Derek Yates no complaints this morning.  Objective:    Vitals:   06/07/23 1516 06/07/23 1954 06/07/23 2249 06/08/23 0305  BP: 116/69 127/72 119/69 (!) 107/59  Pulse:  100 94 89  Resp:  15 15 20   Temp: 98.2 F (36.8 C) 98.3 F (36.8 C) 98.8 F (37.1 C) 98.4  F (36.9 C)  TempSrc: Axillary Axillary Axillary Oral  SpO2:  94% 93% 93%  Weight:    61 kg  Height:       SpO2: 93 % O2 Flow Rate (L/min): 2 L/min   Intake/Output Summary (Last 24 hours) at 06/08/2023 0810 Last data filed at 06/08/2023 0326 Gross per 24 hour  Intake 4772.36 ml  Output 1155 ml  Net 3617.36 ml   Filed Weights    06/07/23 0311 06/07/23 0827 06/08/23 0305  Weight: 60.8 kg 60.8 kg 61 kg    Exam: General exam: In no acute distress. Respiratory system: Good air movement and clear to auscultation. Cardiovascular system: S1 & S2 heard, RRR. No JVD. Gastrointestinal system: Abdomen is nondistended, soft and nontender.  Extremities: No pedal edema. Skin: No rashes, lesions or ulcers Psychiatry: Judgement and insight appear normal. Mood & affect appropriate.  Data Reviewed:    Labs: Basic Metabolic Panel: Recent Labs  Lab 06/06/23 0622  NA 133*  K 4.4  CL 98  CO2 28  GLUCOSE 107*  BUN 25*  CREATININE 0.68  CALCIUM 9.1   GFR Estimated Creatinine Clearance: 58.2 mL/min (by C-G formula based on SCr of 0.68 mg/dL). Liver Function Tests: No results for input(s): "AST", "ALT", "ALKPHOS", "BILITOT", "PROT", "ALBUMIN" in the last 168 hours.   No results for input(s): "LIPASE", "AMYLASE" in the last 168 hours. No results for input(s): "AMMONIA" in the last 168 hours. Coagulation profile No results for input(s): "INR", "PROTIME" in the last 168 hours.  COVID-19 Labs  No results for input(s): "DDIMER", "FERRITIN", "LDH", "CRP" in the last 72 hours.  Lab Results  Component Value Date   SARSCOV2NAA Not Detected 09/14/2018    CBC: No results for input(s): "WBC", "NEUTROABS", "HGB", "HCT", "MCV", "PLT" in the last 168 hours.  Cardiac Enzymes: No results for input(s): "CKTOTAL", "CKMB", "CKMBINDEX", "TROPONINI" in the last 168 hours. BNP (last 3 results) No results for input(s): "PROBNP" in the last 8760 hours. CBG: Recent Labs  Lab 06/07/23 1515 06/07/23 1952 06/07/23 2247 06/08/23 0320 06/08/23 0745  GLUCAP 98 110* 125* 120* 153*   D-Dimer: No results for input(s): "DDIMER" in the last 72 hours. Hgb A1c: No results for input(s): "HGBA1C" in the last 72 hours. Lipid Profile: No results for input(s): "CHOL", "HDL", "LDLCALC", "TRIG", "CHOLHDL", "LDLDIRECT" in the last 72  hours. Thyroid function studies: No results for input(s): "TSH", "T4TOTAL", "T3FREE", "THYROIDAB" in the last 72 hours.  Invalid input(s): "FREET3" Anemia work up: No results for input(s): "VITAMINB12", "FOLATE", "FERRITIN", "TIBC", "IRON", "RETICCTPCT" in the last 72 hours. Sepsis Labs: No results for input(s): "PROCALCITON", "WBC", "LATICACIDVEN" in the last 168 hours.  Microbiology No results found for this or any previous visit (from the past 240 hours).    Medications:    amLODipine  10 mg Per Tube Daily   feeding supplement (PROSource TF20)  60 mL Per Tube BID   fiber supplement (BANATROL TF)  60 mL Per Tube BID   free water  200 mL Per Tube Q4H   guaiFENesin  15 mL Per Tube Q6H   heparin injection (subcutaneous)  5,000 Units Subcutaneous Q12H   insulin aspart  0-9 Units Subcutaneous Q4H   multivitamin with minerals  1 tablet Per Tube Daily   mouth rinse  15 mL Mouth Rinse 4 times per day   pantoprazole (PROTONIX) IV  40 mg Intravenous QHS   Continuous Infusions:  feeding supplement (OSMOLITE 1.5 CAL) 40 mL/hr at 06/08/23 0712   fluconazole (  DIFLUCAN) IV        LOS: 28 days   Marinda Elk  [facility]  06/08/2023, 8:10 AM

## 2023-06-09 ENCOUNTER — Inpatient Hospital Stay (HOSPITAL_COMMUNITY)

## 2023-06-09 DIAGNOSIS — I6389 Other cerebral infarction: Secondary | ICD-10-CM | POA: Diagnosis not present

## 2023-06-09 LAB — GLUCOSE, CAPILLARY
Glucose-Capillary: 150 mg/dL — ABNORMAL HIGH (ref 70–99)
Glucose-Capillary: 158 mg/dL — ABNORMAL HIGH (ref 70–99)
Glucose-Capillary: 117 mg/dL — ABNORMAL HIGH (ref 70–99)
Glucose-Capillary: 150 mg/dL — ABNORMAL HIGH (ref 70–99)
Glucose-Capillary: 121 mg/dL — ABNORMAL HIGH (ref 70–99)
Glucose-Capillary: 128 mg/dL — ABNORMAL HIGH (ref 70–99)

## 2023-06-09 NOTE — Progress Notes (Signed)
 Pt off the unit for MBS.

## 2023-06-09 NOTE — Progress Notes (Signed)
 Speech Language Pathology Treatment: Dysphagia  Patient Details Name: Derek Yates MRN: [id number] DOB: [birth year] Today's Date: 06/09/2023 Time: 4098-1191 SLP Time Calculation (min) (ACUTE ONLY): 8 min  Assessment / Plan / Recommendation Clinical Impression  [name] continues to present with improved and sustained alertness. Today he was interactive; pleasant. His wife was at the bedside.  He accepted teaspoons of water without obvious difficulty, then drank from the edge of a cup, holding the cup with support, with immediate coughing afterward, indicative of potential aspiration. Will plan for MBS this afternoon (pending radiology availability) to assess changes in physiology of swallow and possibility of resuming . D/W his wife.     HPI HPI: Patient is an 83 y.o. male with PMH: dementia, recurrent UTI's, CVA, HTN. He presented to the hospital on 05/11/23 as a code stroke with speech deficits and left sided weakness. 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 Patient has been NPO; Cortrak placed 2/21. He failed [facility] swallow screen with RN.      SLP Plan  MBS      Recommendations for follow up therapy are one component of a multi-disciplinary discharge planning process, led by the attending physician.  Recommendations may be updated based on patient status, additional functional criteria and insurance authorization.    Recommendations  Diet recommendations: NPO Medication Administration: Via alternative means                  Oral care QID;Oral care prior to ice chip/H20;Staff/trained caregiver to provide oral care   Frequent or constant Supervision/Assistance Dysphagia, oropharyngeal phase (R13.12)     MBS    Zalia Hautala L. Samson Frederic, MA CCC/SLP Clinical Specialist - Acute Care SLP Acute Rehabilitation Services Office number [phone]  Blenda Mounts Laurice  06/09/2023, 11:28 AM

## 2023-06-09 NOTE — Evaluation (Signed)
 Modified Barium Swallow Study  Patient Details  Name: Derek Yates MRN: [id number] Date of Birth: 24-Apr-1940  Today's Date: 06/09/2023  Modified Barium Swallow completed.  Full report located under Chart Review in the Imaging Section.  History of Present Illness Patient is an 83 y.o. male with PMH: dementia, recurrent UTI's, CVA, HTN. He presented to the hospital on 05/11/23 as a code stroke with speech deficits and left sided weakness. 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 Patient has been NPO; Cortrak placed 2/21. MBS 3/10 - recs for NPO due primarily to AMS.  Pt was followed by palliative care; decision had been made to shift to comfort care, then reversed by family and SLP was re-reconsulted to repeat swallow assessment on 3/13. PEG 3/18.   Clinical Impression Mr. Provencher was alert and communicative.  He continues to present with significant oral holding of POs, demonstrating minimal movement of tongue and accumulation of POs throughout oral cavity.  There was eventual passage of thin liquid into the pharynx, which spilled into the larynx and was silently aspirated before onset of the swallow.  He did swallow a teaspoon of puree when mixed with thin liquids, demonstrating clearance through the pharynx and transfer through PES. No further swallows could be elicited and oral suctioning was necessary to remove a large amount of material from the oral cavity.    Primary obstacle to eating/drinking is cognition and the intermittent absence of initiation of the swallow sequence.  Silent aspiration continues to be present.  Ability to eat/swallow and protect the airway will likely fluctuate from day-to-day. If family would like for him to resume some POs, small amounts of thin liquid and purees would be the recommended starting point. Continue NPO until results/recs discussed  with family. SLP will f/u.  Factors that may increase risk of adverse event in presence of aspiration [name] 2021): Reduced cognitive function;Limited mobility;Frail or deconditioned;Dependence for feeding and/or oral hygiene;Weak cough;Presence of tubes (ETT, trach, NG, etc.);Aspiration of thick, dense, and/or acidic materials  Swallow Evaluation Recommendations Recommendations: NPO Medication Administration: Via alternative means      Blenda Mounts Laurice 06/09/2023,1:59 PM

## 2023-06-09 NOTE — Progress Notes (Signed)
   06/09/23 1330  Vitals  Temp 98.6 F (37 C)  Temp Source Oral  BP 107/64  MAP (mmHg) 78  BP Location Left Arm  BP Method Automatic  Patient Position (if appropriate) Lying  Pulse Rate 89  Pulse Rate Source Monitor  ECG Heart Rate 88  Resp 20  Oxygen Therapy  SpO2 94 %  O2 Device Room Air   Pt returned to unit from [facility]. Denies pain at this time. Cont to be A&O x 3.

## 2023-06-09 NOTE — Progress Notes (Signed)
 PROGRESS NOTE  Derek Yates XBJ:[id number] DOB: 1940-06-14 DOA: 05/11/2023 PCP: [name], MD   LOS: 29 days   Brief Narrative / Interim history: 83 year old male with history of HTN, CVA, recurrent UTIs, dementia comes into the hospital as a code full, was found to have intraparenchymal hemorrhage to the right caudate extending with intraventricular lesions.  He was admitted to neuro ICU, eventually stabilized and transferred to the hospitalist service.  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  Given underlying dementia palliative was consulted, GOC discussions were held and ongoing aggressive medical care has been established -Due to persistent dysphagia, general surgery was consulted and underwent PEG tube placement on 3/18.  Continue tube feeds, now at goal.  He is DNR -LTAC was pursued, insurance denied this morning, did peer to peer  Active problems Recurrent aspiration pneumonia -status post completed antibiotics, currently respiratory status is better.  He now has a PEG tube  Acute respiratory failure with hypoxia due to aspiration pneumonia - Now been weaned to room air.   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 Continue statins.   Acute kidney injury - Likely hemodynamic mediated resolved with IV fluid resuscitation.   Essential hypertension -blood pressure remains stable, continue  amlodipine 10 mg   Hyperlipidemia - Continue statins.   Dysphagia -PEG tube in place   Advance dementia - Severe memory loss   Protein-calorie malnutrition, severe - Noted. Now with a G-tube nutrition recommended Osmolite Prosource twice a day.  Scheduled Meds:  amLODipine  10 mg Per Tube Daily   feeding supplement (PROSource TF20)  60 mL Per Tube BID   fiber supplement (BANATROL TF)  60 mL Per Tube BID   free water  200 mL Per Tube Q4H   guaiFENesin  15 mL Per Tube Q6H   heparin injection (subcutaneous)  5,000 Units Subcutaneous Q12H   insulin aspart  0-9 Units Subcutaneous Q4H   multivitamin with minerals  1 tablet Per Tube Daily   mouth rinse  15 mL Mouth Rinse 4 times per day   pantoprazole (PROTONIX) IV  40 mg Intravenous QHS   Continuous Infusions:  feeding supplement (OSMOLITE 1.5 CAL) 1,000 mL (06/08/23 2000)   fluconazole (DIFLUCAN) IV 100 mg (06/08/23 1735)   PRN Meds:.acetaminophen **OR** acetaminophen (TYLENOL) oral liquid 160 mg/5 mL **OR** acetaminophen, glycopyrrolate, labetalol, naphazoline-pheniramine, mouth rinse  Current Outpatient Medications  Medication Instructions   atorvastatin (LIPITOR) 10 mg, Oral, Daily   diazepam (VALIUM) 2 mg, Oral, Daily   donepezil (ARICEPT) 5 MG tablet TAKE 1 TABLET(5 MG) BY MOUTH AT BEDTIME   fluticasone furoate-vilanterol (BREO ELLIPTA) 100-25 MCG/ACT AEPB 1 puff, Inhalation, Daily PRN   NIFEdipine (PROCARDIA-XL/NIFEDICAL-XL) 30 mg, Oral, Every morning    Diet Orders (From admission, onward)     Start     Ordered   06/07/23 1501  Diet NPO time specified  Diet effective now       Comments: Spoon sips of water (patient only likes luke warm/room temp water)  with full supervision  Oral care before water Son may give water per SLP   06/07/23 1501            DVT prophylaxis: heparin injection 5,000 Units Start: 05/15/23 1400 SCD's Start: 05/11/23 2019   Lab Results  Component Value Date   PLT 364 05/31/2023       Code Status: Limited: Do not attempt resuscitation (DNR) -DNR-LIMITED -Do Not Intubate/DNI   Family Communication: No family at bedside  Status is: Inpatient Remains inpatient appropriate because: severity of illness  Level of care: Med-Surg  Consultants:  Neurology PCCM General surgery Palliative care  Objective: Vitals:   06/08/23 1952 06/08/23 2319 06/09/23 0355 06/09/23 0757  BP: 113/64 120/66 133/75 109/67  Pulse: 91 94 96 86  Resp: 19 19 19 18   Temp: 97.9 F (36.6 C) 97.7 F (36.5 C) 97.8 F (36.6 C) 99.4 F (37.4 C)  TempSrc: Oral Oral Oral Oral  SpO2: 93% 90% 92% 92%  Weight:      Height:        Intake/Output Summary (Last 24 hours) at 06/09/2023 1005 Last data filed at 06/09/2023 0409 Gross per 24 hour  Intake 1239.33 ml  Output 500 ml  Net 739.33 ml   Wt Readings from Last 3 Encounters:  06/08/23 61 kg  05/11/23 66.6 kg  05/04/23 61.2 kg    Examination:  Constitutional: NAD Eyes: no scleral icterus ENMT: Mucous membranes are moist.  Neck: normal, supple Respiratory: clear to auscultation bilaterally, no wheezing, no crackles.  Cardiovascular: Regular rate and rhythm, no murmurs / rubs / gallops.  Abdomen: non distended, no tenderness. Bowel sounds positive.  Musculoskeletal: no clubbing / cyanosis.    Data Reviewed: I have independently reviewed following labs and imaging studies   CBC No results for input(s): "WBC", "HGB", "HCT", "PLT", "MCV", "MCH", "MCHC", "RDW", "LYMPHSABS", "MONOABS", "EOSABS", "BASOSABS", "BANDABS" in the last 168 hours.  Invalid input(s): "NEUTRABS", "BANDSABD"  Recent Labs  Lab 06/06/23 0622 06/08/23 0822  NA 133* 135  K 4.4 4.3  CL 98 97*  CO2 28 29  GLUCOSE 107* 126*  BUN 25* 25*  CREATININE 0.68 0.80  CALCIUM 9.1 9.3  AST  --  19  ALT  --  19  ALKPHOS  --  60  BILITOT  --  0.6  ALBUMIN  --  2.6*  MG  --  2.2     ------------------------------------------------------------------------------------------------------------------ No results for input(s): "CHOL", "HDL", "LDLCALC", "TRIG", "CHOLHDL", "LDLDIRECT" in the last 72 hours.  Lab Results  Component Value Date   HGBA1C 5.7 (H) 05/13/2023   ------------------------------------------------------------------------------------------------------------------ No results for input(s): "TSH", "T4TOTAL", "T3FREE", "THYROIDAB" in the last 72 hours.  Invalid input(s): "FREET3"  Cardiac Enzymes No results for input(s): "CKMB", "TROPONINI", "MYOGLOBIN" in the last 168 hours.  Invalid input(s): "CK" ------------------------------------------------------------------------------------------------------------------ No results found for: "BNP"  CBG: Recent Labs  Lab 06/08/23 1530 06/08/23 1958 06/08/23 2322 06/09/23 0352 06/09/23 0755  GLUCAP 151* 147* 132* 150* 158*    No results found for this or any previous visit (from the past 240 hours).   Radiology Studies: No results found.   Pamella Pert, MD, PhD [facility]  Between 7 am - 7 pm I am available, please contact me via Amion (for emergencies) or Securechat (non urgent messages)  Between 7 pm - 7 am I am not available, please contact night coverage MD/APP via Amion

## 2023-06-09 NOTE — TOC Progression Note (Addendum)
 Transition of Care (TOC) - Progression Note  Donn Pierini RN,BSN Transitions of Care Unit 4NP (Non Trauma)- RN Case Manager See Treatment Team for direct Phone #   Patient Details  Name: Derek Yates MRN: [id number] Date of Birth: 1940-06-04  Transition of Care [facility]) CM/SW Contact  Zenda Alpers, Lenn Sink, RN Phone Number: 06/09/2023, 2:23 PM  Clinical Narrative:    CM notified this am by [facility] liaison that insurance denied for [facility] admit- P2P offered and MD has completed- denial upheld. [facility] liaison to reach out to son to see if he wants to appeal insurance decision regarding LTACH- [facility].   CM received call from admission liaison at [facility]- Angie- requesting Clinicals per their medical director- TOC has not spoken with son regarding this request- CM informed Angie that we would need to get son's permission to proceed with plan for SNF- as per last conversation son did not give any back up plan if insurance denied LTACH. CM will hold for now on sending clinicals until son gives permission.   CM has also reached out to son- to speak with him regarding disposition plans (VM left awaiting return call). As insurance has denied for [facility] admission (unless decision is appealed) - next option would be SNF vs Home. Pt has had feeding tube (PEG) placed per surgery 3/18.   Update 1645- received return call from son- discussed denial from Insurance for [facility]- discussed next level of care for patient and differences of LTACH vs SNF and how insurance looks at patient needs for next level of care. Son is not willing to make a decision yet on next level of care or if he wants to appeal decision for [facility] LTACH.  Is asking about transferring his dad to [facility], [employer], as well why pt can not stay here for rehab. CM attempted to explain that pt was stable to transition to the next level of care for rehab needs, and that the hospital was for acute needs not rehab needs. Son would  like to speak with someone else to voice his concerns, requesting to speak with CSW- this CM will have TOC supervisor follow up tomorrow to assist .    TOC will continue to follow to assist with transition needs.   Expected Discharge Plan: Skilled Nursing Facility Barriers to Discharge: Continued Medical Work up, SNF Pending bed offer, English as a second language teacher  Expected Discharge Plan and Services   Discharge Planning Services: CM Consult Post Acute Care Choice: Hospice Living arrangements for the past 2 months: Single Family Home                             [facility] Agency: [facility] Date [facility] Agency Contacted: 05/31/23   Representative spoke with at [facility] Agency: Efraim Kaufmann   Social Determinants of Health (SDOH) Interventions SDOH Screenings   Food Insecurity: Patient Unable To Answer (05/13/2023)  Housing: Patient Unable To Answer (05/13/2023)  Transportation Needs: Patient Unable To Answer (05/13/2023)  Utilities: Patient Unable To Answer (05/13/2023)  Depression (PHQ2-9): Medium Risk (02/10/2023)  Financial Resource Strain: Low Risk  (06/20/2021)   Received from [facility], [facility]  Social Connections: Patient Unable To Answer (05/13/2023)  Tobacco Use: Low Risk  (06/07/2023)    Readmission Risk Interventions     No data to display

## 2023-06-10 DIAGNOSIS — I639 Cerebral infarction, unspecified: Secondary | ICD-10-CM | POA: Diagnosis not present

## 2023-06-10 DIAGNOSIS — I6389 Other cerebral infarction: Secondary | ICD-10-CM | POA: Diagnosis not present

## 2023-06-10 LAB — GLUCOSE, CAPILLARY
Glucose-Capillary: 107 mg/dL — ABNORMAL HIGH (ref 70–99)
Glucose-Capillary: 146 mg/dL — ABNORMAL HIGH (ref 70–99)
Glucose-Capillary: 152 mg/dL — ABNORMAL HIGH (ref 70–99)
Glucose-Capillary: 140 mg/dL — ABNORMAL HIGH (ref 70–99)
Glucose-Capillary: 137 mg/dL — ABNORMAL HIGH (ref 70–99)
Glucose-Capillary: 115 mg/dL — ABNORMAL HIGH (ref 70–99)

## 2023-06-10 LAB — CBC
HCT: 33.2 % — ABNORMAL LOW (ref 39.0–52.0)
Hemoglobin: 11.4 g/dL — ABNORMAL LOW (ref 13.0–17.0)
MCH: 28.9 pg (ref 26.0–34.0)
MCHC: 34.3 g/dL (ref 30.0–36.0)
MCV: 84.1 fL (ref 80.0–100.0)
Platelets: 279 10*3/uL (ref 150–400)
RBC: 3.95 MIL/uL — ABNORMAL LOW (ref 4.22–5.81)
RDW: 17.2 % — ABNORMAL HIGH (ref 11.5–15.5)
WBC: 6.4 10*3/uL (ref 4.0–10.5)
nRBC: 0 % (ref 0.0–0.2)

## 2023-06-10 LAB — COMPREHENSIVE METABOLIC PANEL
ALT: 15 U/L (ref 0–44)
AST: 15 U/L (ref 15–41)
Albumin: 2.6 g/dL — ABNORMAL LOW (ref 3.5–5.0)
Alkaline Phosphatase: 65 U/L (ref 38–126)
Anion gap: 6 (ref 5–15)
BUN: 23 mg/dL (ref 8–23)
CO2: 28 mmol/L (ref 22–32)
Calcium: 8.7 mg/dL — ABNORMAL LOW (ref 8.9–10.3)
Chloride: 100 mmol/L (ref 98–111)
Creatinine, Ser: 0.62 mg/dL (ref 0.61–1.24)
GFR, Estimated: >60 mL/min (ref >60)
Glucose, Bld: 109 mg/dL — ABNORMAL HIGH (ref 70–99)
Potassium: 3.6 mmol/L (ref 3.5–5.1)
Sodium: 134 mmol/L — ABNORMAL LOW (ref 135–145)
Total Bilirubin: 0.4 mg/dL (ref 0.0–1.2)
Total Protein: 6.6 g/dL (ref 6.5–8.1)

## 2023-06-10 LAB — MAGNESIUM: Magnesium: 2.2 mg/dL (ref 1.7–2.4)

## 2023-06-10 LAB — PHOSPHORUS: Phosphorus: 3.4 mg/dL (ref 2.5–4.6)

## 2023-06-10 MED ORDER — OSMOLITE 1.5 CAL PO LIQD
1000.0000 mL | ORAL | Status: DC
  Administered 2023-06-10 – 2023-06-17 (×8): 1000 mL
  Filled 2023-06-10 (×2): qty 1000

## 2023-06-10 MED ORDER — PROSOURCE TF20 ENFIT COMPATIBL EN LIQD
60.0000 mL | Freq: Every day | ENTERAL | Status: DC
  Administered 2023-06-11 – 2023-06-23 (×13): 60 mL
  Filled 2023-06-10 (×13): qty 60

## 2023-06-10 NOTE — Progress Notes (Signed)
 Speech Language Pathology Treatment: Dysphagia  Patient Details Name: Derek Yates MRN: [id number] DOB: 1940/11/25 Today's Date: 06/10/2023 Time: 4098-1191 SLP Time Calculation (min) (ACUTE ONLY): 20 min  Assessment / Plan / Recommendation Clinical Impression  Derek Yates was seated in recliner. He was participatory, receptive to a few sips of room-temperature water. Continues to require tactile/verbal cues to initiate a swallow response - there is frequent oral holding.  After several sips of water he politely declined further trials. Spoke to his son, Derek Yates, about the results of yesterday's repeat MBS.  Recommend trials of some of pt's favorite foods to determine if it will help stimulate him to swallow.  He will not likely be able to eat enough to sustain himself, but small amounts of his favorite foods/drinks may offer him some enjoyment. Plan to meet with Derek Yates and his mother tomorrow around 11 am to work with pt,    HPI HPI: Patient is an 83 y.o. male with PMH: dementia, recurrent UTI's, CVA, HTN. He presented to the hospital on 05/11/23 as a code stroke with speech deficits and left sided weakness. 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 Patient has been NPO; Cortrak placed 2/21. MBS 3/10 - recs for NPO due primarily to AMS.  Pt was followed by palliative care; decision had been made to shift to comfort care, then reversed by family and SLP was re-reconsulted to repeat swallow assessment on 3/13. PEG 3/18.      SLP Plan  Continue with current plan of care      Recommendations for follow up therapy are one component of a multi-disciplinary discharge planning process, led by the attending physician.  Recommendations may be updated based on patient status, additional functional criteria and insurance authorization.    Recommendations  Diet recommendations: NPO (sips  of water) Medication Administration: Via alternative means                  Oral care QID   Frequent or constant Supervision/Assistance Dysphagia, oropharyngeal phase (R13.12)     Continue with current plan of care   Derek Yates L. Derek Frederic, MA CCC/SLP Clinical Specialist - Acute Care SLP Acute Rehabilitation Services Office number [phone]   Derek Yates  06/10/2023, 12:08 PM

## 2023-06-10 NOTE — Progress Notes (Addendum)
  Interdisciplinary Goals of Care Family Meeting   Date carried out: 06/10/2023  Location of the meeting: Phone conference  Member's involved: Physician and Other: son over the phone  Durable Power of Attorney or acting medical decision maker: son / wife    Discussion: We discussed goals of care for [employer] .  Long discussion with the patient over the phone this morning regarding current medical situation, active medical problems and were to move things from here.  We discussed about comfort/hospice concepts.  [name] son relates to me that culturally and religiously, the feeling is that hospice is not something that they are interested in as it conflicts with their belief system.  Since currently he is stable, is alert, for now wishes are for him to pursue rehabilitation.  We also discussed as potential reasons why insurance denied LTAC and he plans to appeal.  If insurance continues to deny the appeal, he is interested in pursuing SNF versus inpatient rehab in the area.  We have also discussed potential future trajectories of his that, including the fact that he may have ongoing silent aspiration of his saliva and may have aspiration events in the future.  We discussed about various aspects about that can present and what can it mean for the patient in terms of suffering, what can be done to alleviate and treat.  I let him know that we have a MOST form here that he could read, evaluate and look over.  While he is interested in his father to pursue rehab, may, at one point in the future, wish for the patient not to return to the hospital if something were to happen.  Best at this point is, what ever avenue patient will have following discharge, to have close follow-up with outpatient palliative and potential comfort if he is to decline further.  He is in agreement with this plan  Code status:   Code Status: Limited: Do not attempt resuscitation (DNR) -DNR-LIMITED -Do Not Intubate/DNI     Disposition: SNF/LTAC  Time spent for the meeting: 25 minutes    Pamella Pert, MD  06/10/2023, 11:14 AM

## 2023-06-10 NOTE — Progress Notes (Signed)
 Nutrition Follow-up  DOCUMENTATION CODES:   Severe malnutrition in context of chronic illness  INTERVENTION:  Increase rate of TF regimen to be administered via g-tube: Osmolite 1.5 at 50 ml/h (1200 ml per day) Prosource TF20 60 ml daily FWF added per MD - Q4 hours   Provides 1880 kcal, 95 gm protein, 2114 ml free water daily   MVI with minerals daily via tube  Banatrol BID, each packet provides 5g soluble fiber Monitor tolerance of TF via g-tube  NUTRITION DIAGNOSIS:  Severe Malnutrition related to chronic illness as evidenced by severe fat depletion, severe muscle depletion. 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  2/19 admitted w/ R caudate bleed 2/21 - s/p cortrak placement; tip gastric  2/22 - bedside swallow: NPO, CT head, CXR 2/25 - CT of chest: PNA 2/26 - transfer to 4NP 2/28 - FMS and flatus pouch removed 3/7 - unable to complete MBSS 3/10- MBSS: decline in function 3/11 - swallow edu completed w/ patient's son 3/12 - referral made to residential hospice 3/13 - family would like to pursue PEG. LTACH placement 3/14 - IR: pt not candidate for PEG 3/18 - G-tube placement per surgery, re-initiate TFs  3/20 - MBSS: NPO 3/21 - TF rate increased  Patient much more alert at bedside today. Pt son present as well as speech. Discussed most recent MBSS, which showed continued presence of silent aspiration. Speech encouraging family to bring in soft foods that patient may prefer for evaluation tomorrow (with speech present) for comfort/enjoyment and potential to stimulate swallow. Barrier to swallow remains cognition.    TF at goal rate of 55ml/hr and tolerating. Now that patient more alert and working with therapy, will recommend increasing TF regimen to promote strength and mobility as well as prevent muscle wasting. Repeat NFPE completed today. Remains severely malnourished based off degree of muscle and fat wasting, however no additional areas of wasting noted and skin intact with no breakdown present/noted.   Admit Weight: 61.9kg Current Weight: 61g Lowest Weight: 58.8kg on 2/25  Weight stable from admission. No edema noted. Wt gain would be desirable. Full scope of care continues. Preferred plan would be admission to [facility], however insurance denied. Family is appealing. Medically stable for next level of care. Blood sugars well-controlled and labs unremarkable.  Meds: MVI, pantoprazole, SSI 0-9 Q4 hours   Labs: Na+ 135>134 (L) CBGs 109-126 x48 hours A1c 5.7 (04/2023)  NUTRITION - FOCUSED PHYSICAL EXAM:  Flowsheet Row Most Recent Value  Orbital Region Severe depletion  Upper Arm Region Severe depletion  Thoracic and Lumbar Region Severe depletion  Buccal Region Severe depletion  Temple Region Severe depletion  Clavicle Bone Region Severe depletion  Clavicle and Acromion Bone Region Severe depletion  Scapular Bone Region Severe depletion  Dorsal Hand Severe depletion  Patellar Region Severe depletion  Anterior Thigh Region Severe depletion  Posterior Calf Region Severe depletion  Edema (RD Assessment) None  Hair Reviewed  Eyes Reviewed  Mouth Reviewed  Skin Reviewed  Nails Reviewed    Diet Order:   Diet Order  Diet NPO time specified  Diet effective now             EDUCATION NEEDS:  Not appropriate for education at this time  Skin:  Skin Assessment: Reviewed RN Assessment  Last BM:  3/21 - type 6 x1  Height:  Ht Readings from Last 1 Encounters:  06/07/23 5\' 8"  (1.727 m)   Weight:  Wt Readings from Last 1 Encounters:  06/08/23 61 kg   Ideal Body Weight:   70 kg  BMI:  Body mass index is 20.45 kg/m.  Estimated Nutritional Needs:   Kcal:  1600-1800  Protein:  90-100 grams  Fluid:  >1.6 L/day  Myrtie Cruise MS, RD, LDN Registered Dietitian Clinical Nutrition RD Inpatient Contact Info in Amion

## 2023-06-10 NOTE — NC FL2 (Addendum)
 [place] MEDICAID FL2 LEVEL OF CARE FORM     IDENTIFICATION  Patient Name: Derek Yates Birthdate: 1941/03/10 Sex: male Admission Date (Current Location): 05/11/2023  [facility] and [place] Number:  Producer, television/film/video and Address:  [facility], 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) 05/15/2023   Protein-calorie malnutrition, severe 05/13/2023   Stroke (cerebrum) (HCC) 05/11/2023   Acute cough 04/21/2023   Other abnormal findings in urine 04/21/2023   Mild dementia without behavioral disturbance, psychotic disturbance, mood disturbance, or anxiety (HCC) 02/14/2023   Onychomycosis 08/17/2022   Gait abnormality 08/17/2022   Mixed hyperlipidemia 06/01/2022   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 (HCC) 06/01/2022    Orientation RESPIRATION BLADDER Height & Weight     Self  Normal External catheter, Incontinent Weight: 134 lb 7.7 oz (61 kg) Height:  5\' 8"  (172.7 cm)  BEHAVIORAL SYMPTOMS/MOOD NEUROLOGICAL BOWEL NUTRITION STATUS      Incontinent Diet (please discharge summary)  AMBULATORY STATUS COMMUNICATION OF NEEDS Skin   Extensive Assist Verbally Surgical wounds (closed incision Abdomen)                       Personal Care Assistance Level of Assistance  Bathing, Feeding, Dressing Bathing Assistance: Maximum assistance Feeding assistance: Maximum assistance Dressing Assistance: Maximum assistance     Functional Limitations Info  Sight,  Hearing, Speech Sight Info: Impaired Hearing Info: Adequate Speech Info: Adequate    SPECIAL CARE FACTORS FREQUENCY  PT (By licensed PT), OT (By licensed OT)     PT Frequency: 5x per week OT Frequency: 5x per week            Contractures Contractures Info: Not present    Additional Factors Info  Code Status, Allergies Code Status Info: FULL Allergies Info: Beef-derived Drug products,Pork-derived Products           Current Medications (06/10/2023):  This is the current hospital active medication list Current Facility-Administered Medications  Medication Dose Route Frequency Provider Last Rate Last Admin   acetaminophen (TYLENOL) tablet 650 mg  650 mg Oral Q4H PRN Juliet Rude, PA-C   650 mg at 05/28/23 1443   Or   acetaminophen (TYLENOL) 160 MG/5ML solution 650 mg  650 mg Per Tube Q4H PRN Juliet Rude, PA-C   650 mg at 06/03/23 2159   Or   acetaminophen (TYLENOL) suppository 650 mg  650 mg Rectal Q4H PRN Juliet Rude, PA-C       amLODipine (NORVASC) tablet 10 mg  10 mg Per Tube Daily Juliet Rude, PA-C   10 mg at 06/10/23 5621   feeding supplement (OSMOLITE 1.5 CAL) liquid 1,000 mL  1,000 mL Per Tube Continuous Leatha Gilding, MD       Melene Muller ON 06/11/2023] feeding supplement (PROSource TF20) liquid 60 mL  60 mL Per Tube Daily Gherghe, Daylene Katayama, MD       fiber supplement (BANATROL TF) liquid 60 mL  60 mL Per Tube BID Juliet Rude, PA-C   60 mL at 06/10/23 1610   fluconazole (DIFLUCAN) IVPB 100 mg  100 mg Intravenous Q24H Marinda Elk, MD 50 mL/hr at 06/09/23 1655 100 mg at 06/09/23 1655   free water 200 mL  200 mL Per Tube Q4H Trixie Deis R, PA-C   200 mL at 06/10/23 1245   glycopyrrolate (ROBINUL) tablet 1 mg  1 mg Per Tube BID PRN Juliet Rude, PA-C   1 mg at 06/03/23 9604   guaiFENesin (ROBITUSSIN) 100 MG/5ML liquid 15 mL  15 mL Per Tube Q6H Trixie Deis R, PA-C   15 mL at 06/10/23 1244   heparin injection 5,000 Units  5,000 Units  Subcutaneous Q12H Juliet Rude, PA-C   5,000 Units at 06/10/23 5409   insulin aspart (novoLOG) injection 0-9 Units  0-9 Units Subcutaneous Q4H Juliet Rude, [place]   2 Units at 06/10/23 1244   labetalol (NORMODYNE) injection 10-20 mg  10-20 mg Intravenous Q2H PRN Juliet Rude, PA-C   10 mg at 05/16/23 0401   multivitamin with minerals tablet 1 tablet  1 tablet Per Tube Daily Juliet Rude, PA-C   1 tablet at 06/10/23 8119   naphazoline-pheniramine (NAPHCON-A) 0.025-0.3 % ophthalmic solution 1 drop  1 drop Both Eyes QID PRN Marinda Elk, MD       Oral care mouth rinse  15 mL Mouth Rinse PRN Juliet Rude, PA-C       Oral care mouth rinse  15 mL Mouth Rinse 4 times per day Juliet Rude, PA-C   15 mL at 06/10/23 1245   pantoprazole (PROTONIX) injection 40 mg  40 mg Intravenous QHS Juliet Rude, PA-C   40 mg at 06/09/23 2119     Discharge Medications: Please see discharge summary for a list of discharge medications.  Relevant Imaging Results:  Relevant Lab Results:   Additional Information SSN [id number]  Eduard Roux, LCSW

## 2023-06-10 NOTE — TOC Progression Note (Signed)
 Transition of Care (TOC) - Progression Note  Donn Pierini RN,BSN Transitions of Care Unit 4NP (Non Trauma)- RN Case Manager See Treatment Team for direct Phone #   Patient Details  Name: Derek Yates MRN: [id number] Date of Birth: [birth year]  Transition of Care [facility]) CM/SW Contact  Zenda Alpers, Lenn Sink, RN Phone Number: 06/10/2023, 2:57 PM  Clinical Narrative:    Received TC from son, and went to meet at beside to discuss transition plans.  Son has decided to move forward with insurance appeal for [facility]- [facility] liaison has submitted the paperwork and appeal pending.   Cm reviewed again with son, SNF level of care, as well as INPT rehab and Hospice level. Also answered questions about insurance and making changes during open enrollment- advised son to call Medicare or look on [email or web address] regarding plan options.   Son is agreeable to look at  SNF level as back up to [facility] if denied again on appeal. First preference would be [facility], however he would also like to look at facilities near [place] before making final choice.   TOC will continue to follow for transition needs.    Expected Discharge Plan: Skilled Nursing Facility Barriers to Discharge: Continued Medical Work up, SNF Pending bed offer, English as a second language teacher  Expected Discharge Plan and Services   Discharge Planning Services: CM Consult Post Acute Care Choice: Hospice Living arrangements for the past 2 months: Single Family Home                             [facility] Agency: [facility] Date [facility] Agency Contacted: 05/31/23   Representative spoke with at [facility] Agency: Efraim Kaufmann   Social Determinants of Health (SDOH) Interventions SDOH Screenings   Food Insecurity: Patient Unable To Answer (05/13/2023)  Housing: Patient Unable To Answer (05/13/2023)  Transportation Needs: Patient Unable To Answer (05/13/2023)  Utilities: Patient Unable To Answer (05/13/2023)  Depression (PHQ2-9):  Medium Risk (02/10/2023)  Financial Resource Strain: Low Risk  (06/20/2021)   Received from [facility], [facility]  Social Connections: Patient Unable To Answer (05/13/2023)  Tobacco Use: Low Risk  (06/07/2023)    Readmission Risk Interventions     No data to display

## 2023-06-10 NOTE — Evaluation (Addendum)
 Physical Therapy Re-Evaluation Patient Details Name: Derek Yates MRN: [id number] DOB: 1941/02/18 Today's Date: 06/10/2023  History of Present Illness  83 yo male presents to [facility] on 2/19 as code stroke for speech deficits, L weakness. 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 PEG tube placement on 06/07/2023. PMH includes dementia, recurrent UTIs, CVA, HTN.  Clinical Impression  Patient has made significant progress, in terms of alertness, orientation, verbalizations, and seated balance. Patient was able to tolerate sitting EOB ~10min under supervision. Once EOB, patient able to weight shift laterally and A>P. Patient requires MaxA +2 for bed mobility and totalA +2 for  transfers due to lack of LE strength needed for functional activities. Therapy transferred the patient from bed > chair totalA +2 via squat-pivot transfer.   Acute PT will continue to follow up to maximize his functional independence and  will benefit from continued inpatient follow up therapy, <3 hours/day.      If plan is discharge home, recommend the following: Two people to help with walking and/or transfers;Two people to help with bathing/dressing/bathroom;A lot of help with walking and/or transfers;Assistance with cooking/housework;Assistance with feeding;Assist for transportation;Help with stairs or ramp for entrance   Can travel by private vehicle   No    Equipment Recommendations Wheelchair (measurements PT);Wheelchair cushion (measurements PT);Hospital bed;Hoyer lift  Recommendations for Other Services       Functional Status Assessment Patient has had a recent decline in their functional status and demonstrates the ability to make significant improvements in function in a reasonable and predictable amount of time.     Precautions / Restrictions Precautions Precautions: Fall Recall of Precautions/Restrictions: Impaired Precaution/Restrictions Comments: PEG tube; abdominal binder,  bilateral mitts Restrictions Weight Bearing Restrictions Per Provider Order: No      Mobility  Bed Mobility Overal bed mobility: Needs Assistance Bed Mobility: Supine to Sit     Supine to sit: Max assist, +2 for physical assistance, Used rails     General bed mobility comments: Pt with good initiation with moving RLE OOB and crossing midline with RUE to grasp bed rail. Pt required verbal/tactile cues for sequencing and assistance elevating trunk and scooting forward until B feet were flat on floor    Transfers Overall transfer level: Needs assistance Equipment used: 2 person hand held assist Transfers: Sit to/from Stand, Bed to chair/wheelchair/BSC Sit to Stand: Total assist, +2 physical assistance     Squat pivot transfers: Total assist, +2 physical assistance     General transfer comment: total +2 for power up, rise, steady, and maintaining upright    Ambulation/Gait                  Stairs            Wheelchair Mobility     Tilt Bed    Modified Rankin (Stroke Patients Only) Modified Rankin (Stroke Patients Only) Pre-Morbid Rankin Score: Slight disability Modified Rankin: Severe disability     Balance Overall balance assessment: Needs assistance Sitting-balance support: Bilateral upper extremity supported, Feet supported Sitting balance-Leahy Scale: Fair Sitting balance - Comments: Pt initially leaning to left but after cues and positioning EOB, pt able to progress to supervision   Standing balance support: Bilateral upper extremity supported Standing balance-Leahy Scale: Zero Standing balance comment: dependent on external support                             Pertinent Vitals/Pain Pain  Assessment Pain Assessment: No/denies pain    Home Living Family/patient expects to be discharged to:: Unsure Living Arrangements: Spouse/significant other Available Help at Discharge: Family;Available 24 hours/day Type of Home: House Home  Access: Stairs to enter Entrance Stairs-Rails: None Entrance Stairs-Number of Steps: 2   Home Layout: Able to live on main level with bedroom/bathroom Home Equipment: Agricultural consultant (2 wheels);Cane - single point;Shower seat      Prior Function Prior Level of Function : Needs assist             Mobility Comments: wife helping with walking since trip [place] x2 weeks ago, has been weak since this trip and getting UTI ADLs Comments: pt's wife has been assisting with all ADLs since getting UTI and recent trip . Prior to this pt was indep     Extremity/Trunk Assessment   Upper Extremity Assessment Upper Extremity Assessment: Defer to OT evaluation    Lower Extremity Assessment Lower Extremity Assessment: Generalized weakness    Cervical / Trunk Assessment Cervical / Trunk Assessment: Kyphotic  Communication   Communication Communication: Impaired Factors Affecting Communication: Reduced clarity of speech    Cognition Arousal: Alert Behavior During Therapy: WFL for tasks assessed/performed                           PT - Cognition Comments: Improved A&O x2, self and date/time Following commands: Impaired Following commands impaired: Follows one step commands with increased time, Follows one step commands inconsistently     Cueing Cueing Techniques: Verbal cues, Tactile cues     General Comments General comments (skin integrity, edema, etc.): VSS    Exercises     Assessment/Plan    PT Assessment Patient needs continued PT services  PT Problem List Decreased strength;Decreased mobility;Decreased balance;Decreased activity tolerance;Decreased knowledge of use of DME;Pain;Cardiopulmonary status limiting activity;Decreased knowledge of precautions;Decreased range of motion;Decreased safety awareness;Decreased coordination;Decreased cognition       PT Treatment Interventions Therapeutic activities;DME instruction;Gait training;Therapeutic  exercise;Patient/family education;Balance training;Stair training;Functional mobility training;Neuromuscular re-education    PT Goals (Current goals can be found in the Care Plan section)  Acute Rehab PT Goals Patient Stated Goal: based on subjective intake during initial eval, son wants his dad to be more alert PT Goal Formulation: With patient/family Time For Goal Achievement: 06/24/23 Potential to Achieve Goals: Fair    Frequency Min 2X/week     Co-evaluation               AM-PAC PT "6 Clicks" Mobility  Outcome Measure Help needed turning from your back to your side while in a flat bed without using bedrails?: Total Help needed moving from lying on your back to sitting on the side of a flat bed without using bedrails?: Total Help needed moving to and from a bed to a chair (including a wheelchair)?: Total Help needed standing up from a chair using your arms (e.g., wheelchair or bedside chair)?: Total Help needed to walk in hospital room?: Total Help needed climbing 3-5 steps with a railing? : Total 6 Click Score: 6    End of Session Equipment Utilized During Treatment: Gait belt Activity Tolerance: Patient tolerated treatment well Patient left: in chair;with call bell/phone within reach;with chair alarm set;with restraints reapplied Nurse Communication: Mobility status;Need for lift equipment PT Visit Diagnosis: Other abnormalities of gait and mobility (R26.89);Muscle weakness (generalized) (M62.81)    Time: 2952-8413 PT Time Calculation (min) (ACUTE ONLY): 28 min   Charges:   PT  Evaluation $PT Eval Moderate Complexity: 1 Mod PT Treatments $Therapeutic Activity: 8-22 mins PT General Charges $$ ACUTE PT VISIT: 1 Visit        Doreen Beam, SPT   Cecely Rengel 06/10/2023, 10:57 AM

## 2023-06-10 NOTE — Plan of Care (Signed)

## 2023-06-10 NOTE — Progress Notes (Addendum)
 PROGRESS NOTE  Derek Yates [name]:[id number] DOB: 27-Dec-1940 DOA: 05/11/2023 PCP: [name], MD   LOS: 30 days   Brief Narrative / Interim history: 83 year old male with history of HTN, CVA, recurrent UTIs, dementia comes into the hospital as a code full, was found to have intraparenchymal hemorrhage to the right caudate extending with intraventricular lesions.  He was admitted to neuro ICU, eventually stabilized and transferred to the hospitalist service.  Hospital course complicated by aspiration pneumonia, failure to progress, persistent dysphagia and eventually had to have a PEG tube placed on 3/18  Subjective / 24h Interval events: Alert this morning, confused but pleasant.  Wife is at bedside  Assesement and Plan: Principal Problem:   Stroke (cerebrum) (HCC) Active Problems:   Protein-calorie malnutrition, severe   Nontraumatic subcortical hemorrhage of cerebral hemisphere [facility])  Principal problem ICH to the right caudate with intraventricular hemorrhage -patient presented with ICH, neurology as well as ICU consulted on admission.  Given underlying dementia palliative was consulted, GOC discussions were held and ongoing medical care has been established -Due to persistent dysphagia, general surgery was consulted and underwent PEG tube placement on 3/18.  Continue tube feeds, now at goal.  He is DNR -LTAC was pursued, insurance denied, family appealing  Active problems Recurrent aspiration pneumonia -status post completed antibiotics, currently respiratory status is better.  He now has a PEG tube.  Continues with intermittent coughing but he is afebrile and white count is normal  Acute respiratory failure with hypoxia due to aspiration pneumonia - Now been weaned to room air.   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 Continue statins.   Acute kidney injury - Likely hemodynamic mediated resolved with IV fluid resuscitation.   Essential  hypertension -blood pressure remains stable, continue amlodipine 10 mg   Hyperlipidemia - Continue statins.   Dysphagia -PEG tube in place   Advance dementia - Severe memory loss   Protein-calorie malnutrition, severe - Noted. Now with a G-tube nutrition recommended Osmolite Prosource twice a day.  Scheduled Meds:  amLODipine  10 mg Per Tube Daily   feeding supplement (PROSource TF20)  60 mL Per Tube BID   fiber supplement (BANATROL TF)  60 mL Per Tube BID   free water  200 mL Per Tube Q4H   guaiFENesin  15 mL Per Tube Q6H   heparin injection (subcutaneous)  5,000 Units Subcutaneous Q12H   insulin aspart  0-9 Units Subcutaneous Q4H   multivitamin with minerals  1 tablet Per Tube Daily   mouth rinse  15 mL Mouth Rinse 4 times per day   pantoprazole (PROTONIX) IV  40 mg Intravenous QHS   Continuous Infusions:  feeding supplement (OSMOLITE 1.5 CAL) 1,000 mL (06/09/23 2120)   fluconazole (DIFLUCAN) IV 100 mg (06/09/23 1655)   PRN Meds:.acetaminophen **OR** acetaminophen (TYLENOL) oral liquid 160 mg/5 mL **OR** acetaminophen, glycopyrrolate, labetalol, naphazoline-pheniramine, mouth rinse  Current Outpatient Medications  Medication Instructions   atorvastatin (LIPITOR) 10 mg, Oral, Daily   diazepam (VALIUM) 2 mg, Oral, Daily   donepezil (ARICEPT) 5 MG tablet TAKE 1 TABLET(5 MG) BY MOUTH AT BEDTIME   fluticasone furoate-vilanterol (BREO ELLIPTA) 100-25 MCG/ACT AEPB 1 puff, Inhalation, Daily PRN   NIFEdipine (PROCARDIA-XL/NIFEDICAL-XL) 30 mg, Oral, Every morning    Diet Orders (From admission, onward)     Start     Ordered   06/07/23 1501  Diet NPO time specified  Diet effective now       Comments: Spoon sips  of water (patient only likes luke warm/room temp water) with full supervision  Oral care before water Son may give water per SLP   06/07/23 1501            DVT prophylaxis: heparin injection 5,000 Units Start: 05/15/23 1400 SCD's Start: 05/11/23 2019   Lab  Results  Component Value Date   PLT 279 06/10/2023      Code Status: Limited: Do not attempt resuscitation (DNR) -DNR-LIMITED -Do Not Intubate/DNI   Family Communication: Wife at bedside  Status is: Inpatient Remains inpatient appropriate because: severity of illness  Level of care: Med-Surg  Consultants:  Neurology PCCM General surgery Palliative care  Objective: Vitals:   06/09/23 2323 06/09/23 2327 06/10/23 0318 06/10/23 0758  BP:   120/68 109/70  Pulse: 90  96 86  Resp: 15  (!) 24 (!) 25  Temp:  98.4 F (36.9 C) 98.1 F (36.7 C) 98.8 F (37.1 C)  TempSrc:  Oral Oral Axillary  SpO2:   93% 96%  Weight:      Height:        Intake/Output Summary (Last 24 hours) at 06/10/2023 1112 Last data filed at 06/10/2023 0331 Gross per 24 hour  Intake --  Output 1050 ml  Net -1050 ml   Wt Readings from Last 3 Encounters:  06/08/23 61 kg  05/11/23 66.6 kg  05/04/23 61.2 kg    Examination:  Constitutional: NAD Eyes: lids and conjunctivae normal, no scleral icterus ENMT: mmm Neck: normal, supple Respiratory: clear to auscultation bilaterally, no wheezing, no crackles. Normal respiratory effort.  Cardiovascular: Regular rate and rhythm, no murmurs / rubs / gallops. No LE edema. Abdomen: soft, no distention, no tenderness. Bowel sounds positive.    Data Reviewed: I have independently reviewed following labs and imaging studies   CBC Recent Labs  Lab 06/10/23 0516  WBC 6.4  HGB 11.4*  HCT 33.2*  PLT 279  MCV 84.1  MCH 28.9  MCHC 34.3  RDW 17.2*    Recent Labs  Lab 06/06/23 0622 06/08/23 0822 06/10/23 0516  NA 133* 135 134*  K 4.4 4.3 3.6  CL 98 97* 100  CO2 28 29 28   GLUCOSE 107* 126* 109*  BUN 25* 25* 23  CREATININE 0.68 0.80 0.62  CALCIUM 9.1 9.3 8.7*  AST  --  19 15  ALT  --  19 15  ALKPHOS  --  60 65  BILITOT  --  0.6 0.4  ALBUMIN  --  2.6* 2.6*  MG  --  2.2 2.2     ------------------------------------------------------------------------------------------------------------------ No results for input(s): "CHOL", "HDL", "LDLCALC", "TRIG", "CHOLHDL", "LDLDIRECT" in the last 72 hours.  Lab Results  Component Value Date   HGBA1C 5.7 (H) 05/13/2023   ------------------------------------------------------------------------------------------------------------------ No results for input(s): "TSH", "T4TOTAL", "T3FREE", "THYROIDAB" in the last 72 hours.  Invalid input(s): "FREET3"  Cardiac Enzymes No results for input(s): "CKMB", "TROPONINI", "MYOGLOBIN" in the last 168 hours.  Invalid input(s): "CK" ------------------------------------------------------------------------------------------------------------------ No results found for: "BNP"  CBG: Recent Labs  Lab 06/09/23 1607 06/09/23 1924 06/09/23 2320 06/10/23 0332 06/10/23 0754  GLUCAP 150* 121* 128* 107* 146*    No results found for this or any previous visit (from the past 240 hours).   Radiology Studies: DG Swallowing Func-Speech Pathology Result Date: 06/09/2023 Table formatting from the original result was not included. Modified Barium Swallow Study Patient Details Name: NIYAM BISPING MRN: [id number] Date of Birth: [birth year] Today's Date: 06/09/2023 HPI/PMH: HPI: Patient is an 83 y.o.  male with PMH: dementia, recurrent UTI's, CVA, HTN. He presented to the hospital on 05/11/23 as a code stroke with speech deficits and left sided weakness. 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 Patient has been NPO; Cortrak placed 2/21. MBS 3/10 - recs for NPO due primarily to AMS.  Pt was followed by palliative care; decision had been made to shift to comfort care, then reversed by family and SLP was re-reconsulted to repeat swallow assessment on 3/13. PEG 3/18. Clinical Impression:  Clinical Impression: Mr. Weightman was alert and communicative.  He continues to present with significant oral holding of POs, demonstrating minimal movement of tongue and accumulation of POs throughout oral cavity.  There was eventual passage of thin liquid into the pharynx, which spilled into the larynx and was silently aspirated before onset of the swallow.  He did swallow a teaspoon of puree when mixed with thin liquids, demonstrating clearance through the pharynx and transfer through PES. No further swallows could be elicited and oral suctioning was necessary to remove a large amount of material from the oral cavity.  Primary obstacle to eating/drinking is cognition and the intermittent absence of initiation of the swallow sequence.  Silent aspiration continues to be present.  Ability to eat/swallow and protect the airway will likely fluctuate from day-to-day. If family would like for him to resume some POs, small amounts of thin liquid and purees would be the recommended starting point. Continue NPO until results/recs discussed with family. SLP will f/u. Factors that may increase risk of adverse event in presence of aspiration [name] [employer] [name] 2021): Factors that may increase risk of adverse event in presence of aspiration [name] [employer] [name] 2021): Reduced cognitive function; Limited mobility; Frail or deconditioned; Dependence for feeding and/or oral hygiene; Weak cough; Presence of tubes (ETT, trach, NG, etc.); Aspiration of thick, dense, and/or acidic materials Recommendations/Plan: Swallowing Evaluation Recommendations Swallowing Evaluation Recommendations Recommendations: NPO Medication Administration: Via alternative means Treatment Plan Treatment Plan Functional status assessment: Patient has had a recent decline in their functional status and/or demonstrates limited ability to make significant improvements in function in a reasonable and predictable amount of time. Treatment frequency: Min 2x/week  Treatment duration: 2 weeks Interventions: Patient/family education Recommendations Recommendations for follow up therapy are one component of a multi-disciplinary discharge planning process, led by the attending physician.  Recommendations may be updated based on patient status, additional functional criteria and insurance authorization. Assessment: Orofacial Exam: Orofacial Exam Oral Cavity - Dentition: Poor condition; Missing dentition Anatomy: Anatomy: WFL Boluses Administered: Boluses Administered Boluses Administered: Thin liquids (Level 0); Puree  Oral Impairment Domain: Oral Impairment Domain Lip Closure: Escape beyond mid-chin Tongue control during bolus hold: Not tested Bolus preparation/mastication: -- (mechanical solids not offered) Bolus transport/lingual motion: Minimal-no tongue motion Oral residue: Minimal to no clearance Location of oral residue : Floor of mouth; Tongue; Lateral sulci Initiation of pharyngeal swallow : Pyriform sinuses  Pharyngeal Impairment Domain: Pharyngeal Impairment Domain Soft palate elevation: No bolus between soft palate (SP)/pharyngeal wall (PW) Laryngeal elevation: Partial superior movement of thyroid cartilage/partial approximation of arytenoids to epiglottic petiole Anterior hyoid excursion: Complete anterior movement Epiglottic movement: Complete inversion Laryngeal vestibule closure: Incomplete, narrow column air/contrast in laryngeal vestibule Pharyngeal stripping wave : Present - complete Pharyngeal contraction (A/P view only): N/A Pharyngoesophageal segment opening: Partial distention/partial duration, partial obstruction of flow Tongue base retraction: Trace column of contrast or air between tongue  base and PPW Pharyngeal residue: Complete pharyngeal clearance  Esophageal Impairment Domain: Esophageal Impairment Domain Esophageal clearance upright position: -- (n/a) Pill: Pill Consistency administered: -- (n/a) Penetration/Aspiration Scale Score:  Penetration/Aspiration Scale Score 1.  Material does not enter airway: Puree 8.  Material enters airway, passes BELOW cords without attempt by patient to eject out (silent aspiration) : Thin liquids (Level 0) Compensatory Strategies: Compensatory Strategies Compensatory strategies: -- (pt unable to carry out compensatory strategies)   General Information: Caregiver present: No  Diet Prior to this Study: NPO; G-tube   Temperature : Normal   Respiratory Status: WFL   No data recorded  History of Recent Intubation: No  Behavior/Cognition: Alert No data recorded Baseline vocal quality/speech: Hypophonia/low volume Volitional Cough: Unable to elicit Volitional Swallow: Unable to elicit No data recorded Goal Planning: Prognosis for improved oropharyngeal function: Guarded Barriers to Reach Goals: Cognitive deficits No data recorded Patient/Family Stated Goal: pt wants pizza No data recorded Pain: Pain Assessment Pain Assessment: No/denies pain End of Session: Start Time:SLP Start Time (ACUTE ONLY): 1300 Stop Time: SLP Stop Time (ACUTE ONLY): 1325 Time Calculation:SLP Time Calculation (min) (ACUTE ONLY): 25 min Charges: SLP Evaluations $ SLP Speech Visit: 1 Visit SLP Evaluations $MBS Swallow: 1 Procedure $Swallowing Treatment: 1 Procedure SLP visit diagnosis: SLP Visit Diagnosis: Dysphagia, oropharyngeal phase (R13.12) Past Medical History: Past Medical History: Diagnosis Date  Dementia (HCC)   Hypertension   Pneumonia   Stroke [facility])  Past Surgical History: Past Surgical History: Procedure Laterality Date  LAPAROSCOPIC GASTROSTOMY N/A 06/07/2023  Procedure: CREATION, GASTROSTOMY, LAPAROSCOPIC;  Surgeon: Kinsinger, De Blanch, MD;  Location: [facility] OR;  Service: General;  Laterality: N/A;  PEG PLACEMENT N/A 06/07/2023  Procedure: INSERTION, PEG TUBE;  Surgeon: Rodman Pickle, MD;  Location: [facility] OR;  Service: General;  Laterality: N/A;  POSSIBLE GASTROSTOMY Carolan Shiver 06/09/2023, 2:01 PM Amanda L. Samson Frederic, MA  CCC/SLP Clinical Specialist - Acute Care SLP Acute Rehabilitation Services Office number [phone]    Pamella Pert, MD, PhD [facility]  Between 7 am - 7 pm I am available, please contact me via Amion (for emergencies) or Securechat (non urgent messages)  Between 7 pm - 7 am I am not available, please contact night coverage MD/APP via Amion

## 2023-06-11 DIAGNOSIS — I6389 Other cerebral infarction: Secondary | ICD-10-CM | POA: Diagnosis not present

## 2023-06-11 LAB — GLUCOSE, CAPILLARY
Glucose-Capillary: 103 mg/dL — ABNORMAL HIGH (ref 70–99)
Glucose-Capillary: 104 mg/dL — ABNORMAL HIGH (ref 70–99)
Glucose-Capillary: 107 mg/dL — ABNORMAL HIGH (ref 70–99)
Glucose-Capillary: 121 mg/dL — ABNORMAL HIGH (ref 70–99)
Glucose-Capillary: 148 mg/dL — ABNORMAL HIGH (ref 70–99)
Glucose-Capillary: 98 mg/dL (ref 70–99)

## 2023-06-11 MED ORDER — FLUCONAZOLE 100 MG PO TABS
100.0000 mg | ORAL_TABLET | Freq: Every evening | ORAL | Status: DC
  Administered 2023-06-11 – 2023-06-12 (×2): 100 mg
  Filled 2023-06-11 (×2): qty 1

## 2023-06-11 MED ORDER — PNEUMOCOCCAL 20-VAL CONJ VACC 0.5 ML IM SUSY
0.5000 mL | PREFILLED_SYRINGE | INTRAMUSCULAR | Status: AC
  Administered 2023-06-12: 0.5 mL via INTRAMUSCULAR
  Filled 2023-06-11: qty 0.5

## 2023-06-11 MED ORDER — NYSTATIN 100000 UNIT/ML MT SUSP
5.0000 mL | Freq: Three times a day (TID) | OROMUCOSAL | Status: DC
  Administered 2023-06-11 – 2023-06-17 (×25): 500000 [IU] via ORAL
  Filled 2023-06-11 (×25): qty 5

## 2023-06-11 NOTE — Progress Notes (Addendum)
 Patient received form 4N, alert and oriented X2, vital signs stable, wife at bedside, bed alarm on, call bell within reach, will continue to monitor.

## 2023-06-11 NOTE — Progress Notes (Signed)
 PROGRESS NOTE  Derek Yates ZOX:[id number] DOB: Aug 10, 1940 DOA: 05/11/2023 PCP: [name], MD   LOS: 31 days   Brief Narrative / Interim history: 83 year old male with history of HTN, CVA, recurrent UTIs, dementia comes into the hospital as a code full, was found to have intraparenchymal hemorrhage to the right caudate extending with intraventricular lesions.  He was admitted to neuro ICU, eventually stabilized and transferred to the hospitalist service.  Hospital course complicated by aspiration pneumonia, failure to progress, persistent dysphagia and eventually had to have a PEG tube placed on 3/18  Subjective / 24h Interval events: Alert, no complaints, underlying dementia  Assesement and Plan: Principal Problem:   Stroke (cerebrum) (HCC) Active Problems:   Protein-calorie malnutrition, severe   Nontraumatic subcortical hemorrhage of cerebral hemisphere [facility])  Principal problem ICH to the right caudate with intraventricular hemorrhage -patient presented with ICH, neurology as well as ICU consulted on admission.  Given underlying dementia palliative was consulted, GOC discussions were held and ongoing medical care has been established -Due to persistent dysphagia, general surgery was consulted and underwent PEG tube placement on 3/18.  Continue tube feeds, now at goal.  He is DNR -LTAC was pursued, insurance denied, family appealing  Active problems Recurrent aspiration pneumonia -status post completed antibiotics, currently respiratory status is better.  He now has a PEG tube.  Continues with intermittent coughing but he is afebrile and white count is normal  Acute respiratory failure with hypoxia due to aspiration pneumonia - Now been weaned to room air.  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 Continue statins.  Oral thrush -started nystatin  Acute kidney injury - Likely hemodynamic mediated resolved with IV  fluid resuscitation.  Creatinine is stable   Essential hypertension -blood pressure stable today, continue amlodipine 10 mg   Hyperlipidemia - Continue statins.   Dysphagia -PEG tube in place   Advance dementia - Severe memory loss   Protein-calorie malnutrition, severe - Noted. Now with a G-tube nutrition recommended Osmolite Prosource twice a day.  Scheduled Meds:  amLODipine  10 mg Per Tube Daily   feeding supplement (PROSource TF20)  60 mL Per Tube Daily   fiber supplement (BANATROL TF)  60 mL Per Tube BID   free water  200 mL Per Tube Q4H   guaiFENesin  15 mL Per Tube Q6H   heparin injection (subcutaneous)  5,000 Units Subcutaneous Q12H   insulin aspart  0-9 Units Subcutaneous Q4H   multivitamin with minerals  1 tablet Per Tube Daily   nystatin  5 mL Oral QID   mouth rinse  15 mL Mouth Rinse 4 times per day   pantoprazole (PROTONIX) IV  40 mg Intravenous QHS   Continuous Infusions:  feeding supplement (OSMOLITE 1.5 CAL) 1,000 mL (06/10/23 2212)   fluconazole (DIFLUCAN) IV 100 mg (06/10/23 1658)   PRN Meds:.acetaminophen **OR** acetaminophen (TYLENOL) oral liquid 160 mg/5 mL **OR** acetaminophen, glycopyrrolate, labetalol, naphazoline-pheniramine, mouth rinse  Current Outpatient Medications  Medication Instructions   atorvastatin (LIPITOR) 10 mg, Oral, Daily   diazepam (VALIUM) 2 mg, Oral, Daily   donepezil (ARICEPT) 5 MG tablet TAKE 1 TABLET(5 MG) BY MOUTH AT BEDTIME   fluticasone furoate-vilanterol (BREO ELLIPTA) 100-25 MCG/ACT AEPB 1 puff, Inhalation, Daily PRN   NIFEdipine (PROCARDIA-XL/NIFEDICAL-XL) 30 mg, Oral, Every morning    Diet Orders (From admission, onward)     Start     Ordered   06/07/23 1501  Diet NPO time  specified  Diet effective now       Comments: Spoon sips of water (patient only likes luke warm/room temp water) with full supervision  Oral care before water Son may give water per SLP   06/07/23 1501            DVT prophylaxis: heparin  injection 5,000 Units Start: 05/15/23 1400 SCD's Start: 05/11/23 2019   Lab Results  Component Value Date   PLT 279 06/10/2023      Code Status: Limited: Do not attempt resuscitation (DNR) -DNR-LIMITED -Do Not Intubate/DNI   Family Communication: Wife at bedside  Status is: Inpatient Remains inpatient appropriate because: severity of illness  Level of care: Med-Surg  Consultants:  Neurology PCCM General surgery Palliative care  Objective: Vitals:   06/10/23 2314 06/11/23 0330 06/11/23 0424 06/11/23 0721  BP: (!) 109/59 136/70  106/64  Pulse: 90 91  84  Resp:    (!) 22  Temp: 98.6 F (37 C) 98.5 F (36.9 C)  98.5 F (36.9 C)  TempSrc: Oral Oral  Axillary  SpO2: 98% 95%  93%  Weight:   60.6 kg   Height:        Intake/Output Summary (Last 24 hours) at 06/11/2023 0748 Last data filed at 06/11/2023 0344 Gross per 24 hour  Intake --  Output 1900 ml  Net -1900 ml   Wt Readings from Last 3 Encounters:  06/11/23 60.6 kg  05/11/23 66.6 kg  05/04/23 61.2 kg    Examination:  Constitutional: NAD Eyes: lids and conjunctivae normal, no scleral icterus ENMT: mmm Neck: normal, supple Respiratory: clear to auscultation bilaterally, no wheezing, no crackles. Normal respiratory effort.  Cardiovascular: Regular rate and rhythm, no murmurs / rubs / gallops. No LE edema. Abdomen: soft, no distention, no tenderness. Bowel sounds positive.    Data Reviewed: I have independently reviewed following labs and imaging studies   CBC Recent Labs  Lab 06/10/23 0516  WBC 6.4  HGB 11.4*  HCT 33.2*  PLT 279  MCV 84.1  MCH 28.9  MCHC 34.3  RDW 17.2*    Recent Labs  Lab 06/06/23 0622 06/08/23 0822 06/10/23 0516  NA 133* 135 134*  K 4.4 4.3 3.6  CL 98 97* 100  CO2 28 29 28   GLUCOSE 107* 126* 109*  BUN 25* 25* 23  CREATININE 0.68 0.80 0.62  CALCIUM 9.1 9.3 8.7*  AST  --  19 15  ALT  --  19 15  ALKPHOS  --  60 65  BILITOT  --  0.6 0.4  ALBUMIN  --  2.6* 2.6*  MG   --  2.2 2.2    ------------------------------------------------------------------------------------------------------------------ No results for input(s): "CHOL", "HDL", "LDLCALC", "TRIG", "CHOLHDL", "LDLDIRECT" in the last 72 hours.  Lab Results  Component Value Date   HGBA1C 5.7 (H) 05/13/2023   ------------------------------------------------------------------------------------------------------------------ No results for input(s): "TSH", "T4TOTAL", "T3FREE", "THYROIDAB" in the last 72 hours.  Invalid input(s): "FREET3"  Cardiac Enzymes No results for input(s): "CKMB", "TROPONINI", "MYOGLOBIN" in the last 168 hours.  Invalid input(s): "CK" ------------------------------------------------------------------------------------------------------------------ No results found for: "BNP"  CBG: Recent Labs  Lab 06/10/23 1644 06/10/23 1951 06/10/23 2308 06/11/23 0411 06/11/23 0718  GLUCAP 140* 137* 115* 121* 103*    No results found for this or any previous visit (from the past 240 hours).   Radiology Studies: No results found.    Pamella Pert, MD, PhD [facility]  Between 7 am - 7 pm I am available, please contact me via Amion (  for emergencies) or Securechat (non urgent messages)  Between 7 pm - 7 am I am not available, please contact night coverage MD/APP via Amion

## 2023-06-11 NOTE — Progress Notes (Signed)
 Speech Language Pathology Treatment: Dysphagia  Patient Details Name: Derek Yates MRN: [id number] DOB: November 22, 1940 Today's Date: 06/11/2023 Time: 7829-5621 SLP Time Calculation (min) (ACUTE ONLY): 32 min  Assessment / Plan / Recommendation Clinical Impression  Met Derek Yates and her son, Derek Yates, to work with Derek Yates on swallowing. He was seated in recliner, alert and communicative.  Derek Yates brought food that she had prepared at home, consistency similar to dysphagia 1.  Derek Yates was able to feed himself using a spoon.  He demonstrated active attention and mastication, with palpable swallow, no oral residue post-swallow. No oral holding. He finished container of food and drank water from a cup with a mild cough after drinking sequential sips.    Recommend that pt resume eating familiar foods from home - generally soft/pureed and thin liquids.  When he is alert and actively chewing, allow him to feed himself and eat.  When he holds food in his mouth and makes no effort to swallow, then do not feed. Ability to swallow is going to fluctuate from day to day. We should focus on giving him opportunities to eat the foods he enjoys.   Discussed the above with Derek Yates, Derek Yates, and Derek Yates, who are in agreement.  SLP will continue to follow. Will put in orders for full liquids, but I anticipate he will primarily be eating foods from home.   HPI HPI: Patient is an 83 y.o. male with PMH: dementia, recurrent UTI's, CVA, HTN. He presented to the hospital on 05/11/23 as a code stroke with speech deficits and left sided weakness. 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 Patient has been NPO; Cortrak placed 2/21. MBS 3/10 - recs for NPO due primarily to AMS.  Pt was followed by palliative care; decision had been made to shift to comfort care, then reversed by family and SLP  was re-reconsulted to repeat swallow assessment on 3/13. PEG 3/18.      SLP Plan  Continue with current plan of care      Recommendations for follow up therapy are one component of a multi-disciplinary discharge planning process, led by the attending physician.  Recommendations may be updated based on patient status, additional functional criteria and insurance authorization.    Recommendations  Diet recommendations:  (dysphagia 1, thin liquids from home) Liquids provided via: Cup;Teaspoon Medication Administration: Via alternative means Supervision: Full supervision/cueing for compensatory strategies Compensations: Slow rate                  Oral care BID   Frequent or constant Supervision/Assistance Dysphagia, oropharyngeal phase (R13.12)     Continue with current plan of care    Derek Yates L. Derek Frederic, MA CCC/SLP Clinical Specialist - Acute Care SLP Acute Rehabilitation Services Office number [phone]  Derek Yates Derek Yates  06/11/2023, 11:54 AM

## 2023-06-11 NOTE — Progress Notes (Signed)
 Order to transfer patient from 4N-02 to 3W-21. Wife at bedside and son Beckey Downing notified of transfer. Called report to [employer]. Personal belongings sent with patient.

## 2023-06-12 DIAGNOSIS — I6389 Other cerebral infarction: Secondary | ICD-10-CM | POA: Diagnosis not present

## 2023-06-12 DIAGNOSIS — I629 Nontraumatic intracranial hemorrhage, unspecified: Secondary | ICD-10-CM | POA: Diagnosis not present

## 2023-06-12 DIAGNOSIS — E43 Unspecified severe protein-calorie malnutrition: Secondary | ICD-10-CM

## 2023-06-12 DIAGNOSIS — I610 Nontraumatic intracerebral hemorrhage in hemisphere, subcortical: Secondary | ICD-10-CM | POA: Diagnosis not present

## 2023-06-12 LAB — COMPREHENSIVE METABOLIC PANEL
ALT: 17 U/L (ref 0–44)
AST: 21 U/L (ref 15–41)
Albumin: 2.9 g/dL — ABNORMAL LOW (ref 3.5–5.0)
Alkaline Phosphatase: 79 U/L (ref 38–126)
Anion gap: 12 (ref 5–15)
BUN: 16 mg/dL (ref 8–23)
CO2: 27 mmol/L (ref 22–32)
Calcium: 9.3 mg/dL (ref 8.9–10.3)
Chloride: 99 mmol/L (ref 98–111)
Creatinine, Ser: 0.59 mg/dL — ABNORMAL LOW (ref 0.61–1.24)
GFR, Estimated: >60 mL/min (ref >60)
Glucose, Bld: 105 mg/dL — ABNORMAL HIGH (ref 70–99)
Potassium: 4.3 mmol/L (ref 3.5–5.1)
Sodium: 138 mmol/L (ref 135–145)
Total Bilirubin: 0.3 mg/dL (ref 0.0–1.2)
Total Protein: 7.4 g/dL (ref 6.5–8.1)

## 2023-06-12 LAB — CBC
HCT: 38.3 % — ABNORMAL LOW (ref 39.0–52.0)
Hemoglobin: 13.4 g/dL (ref 13.0–17.0)
MCH: 29.6 pg (ref 26.0–34.0)
MCHC: 35 g/dL (ref 30.0–36.0)
MCV: 84.5 fL (ref 80.0–100.0)
Platelets: 256 10*3/uL (ref 150–400)
RBC: 4.53 MIL/uL (ref 4.22–5.81)
RDW: 17.1 % — ABNORMAL HIGH (ref 11.5–15.5)
WBC: 6.8 10*3/uL (ref 4.0–10.5)
nRBC: 0 % (ref 0.0–0.2)

## 2023-06-12 LAB — GLUCOSE, CAPILLARY
Glucose-Capillary: 129 mg/dL — ABNORMAL HIGH (ref 70–99)
Glucose-Capillary: 141 mg/dL — ABNORMAL HIGH (ref 70–99)
Glucose-Capillary: 122 mg/dL — ABNORMAL HIGH (ref 70–99)
Glucose-Capillary: 169 mg/dL — ABNORMAL HIGH (ref 70–99)
Glucose-Capillary: 138 mg/dL — ABNORMAL HIGH (ref 70–99)

## 2023-06-12 LAB — PHOSPHORUS: Phosphorus: 4 mg/dL (ref 2.5–4.6)

## 2023-06-12 LAB — MAGNESIUM: Magnesium: 2.3 mg/dL (ref 1.7–2.4)

## 2023-06-12 NOTE — Plan of Care (Signed)
 On Peg tube feeding, tolerated well. Confused but follow commands, no other significant changes happen as of this time.  Problem: Education: Goal: Knowledge of General Education information will improve Description: Including pain rating scale, medication(s)/side effects and non-pharmacologic comfort measures Outcome: Progressing   Problem: Clinical Measurements: Goal: Will remain free from infection Outcome: Progressing   Problem: Clinical Measurements: Goal: Respiratory complications will improve Outcome: Progressing   Problem: Activity: Goal: Risk for activity intolerance will decrease Outcome: Progressing   Problem: Nutrition: Goal: Adequate nutrition will be maintained Outcome: Progressing   Problem: Coping: Goal: Level of anxiety will decrease Outcome: Progressing   Problem: Intracerebral Hemorrhage Tissue Perfusion: Goal: Complications of Intracerebral Hemorrhage will be minimized Outcome: Progressing

## 2023-06-12 NOTE — Plan of Care (Signed)
  Problem: Clinical Measurements: Goal: Respiratory complications will improve Outcome: Progressing Goal: Cardiovascular complication will be avoided Outcome: Progressing   Problem: Nutrition: Goal: Adequate nutrition will be maintained Outcome: Progressing   Problem: Coping: Goal: Level of anxiety will decrease Outcome: Progressing   Problem: Elimination: Goal: Will not experience complications related to bowel motility Outcome: Progressing Goal: Will not experience complications related to urinary retention Outcome: Progressing   Problem: Skin Integrity: Goal: Risk for impaired skin integrity will decrease Outcome: Progressing   Problem: Self-Care: Goal: Ability to communicate needs accurately will improve Outcome: Progressing

## 2023-06-12 NOTE — Progress Notes (Signed)
 Patient alert and awake, finished entire cup of apple sauce and pudding, did not notice any oral residue after swallowing, son stated, patient's wife is bringing food from home and wants to try the food when he is alert, awake and ready to eat

## 2023-06-12 NOTE — Progress Notes (Signed)
 PROGRESS NOTE  [name] YQM:[id number] DOB: 05-23-1940   PCP: [name], MD  Patient is from: Home.  DOA: 05/11/2023 LOS: 32  Chief complaints No chief complaint on file.    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 He was admitted to neuro ICU, eventually stabilized and transferred to the hospitalist service. Hospital course complicated by aspiration pneumonia, failure to progress, persistent dysphagia and eventually had to have a PEG tube placed on 3/18.  He is also on dysphagia 1 diet.  Subjective: Seen and examined earlier this morning.  No major events overnight of this morning.  No specific complaints but not a reliable historian.  He is awake and alert but only oriented to self and "hospital".  Objective: Vitals:   06/12/23 0434 06/12/23 0500 06/12/23 0734 06/12/23 1100  BP: 103/81  124/75 108/63  Pulse: 73  78 81  Resp: 18  18 18   Temp: 98.9 F (37.2 C)  98.8 F (37.1 C) 98.1 F (36.7 C)  TempSrc: Oral  Oral Oral  SpO2: 97%  96% 96%  Weight:  60.1 kg    Height:        Examination:  GENERAL: No apparent distress.  Nontoxic. HEENT: MMM.  Vision and hearing grossly intact.  NECK: Supple.  No apparent JVD.  RESP:  No IWOB.  Fair aeration bilaterally. CVS:  RRR. Heart sounds normal.  ABD/GI/GU: BS+. Abd soft, NTND.  G-tube in place. MSK/EXT:  Moves extremities. No apparent deformity. No edema.  SKIN: no apparent skin lesion or wound NEURO: Wake and alert.  Oriented to self and "hospital".  Follows commands.  No apparent focal neuro deficit. PSYCH: Calm. Normal affect.   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  Given underlying dementia palliative was consulted, GOC discussions were held and ongoing medical care has been established.  Due to persistent dysphagia, PEG tube placed by general surgery on 3/18.  Also upgraded to dysphagia 1 diet. -Continue tube feeds, now at goal.   -LTAC was pursued, insurance denied, family appealing   Recurrent aspiration pneumonia: Completed antibiotic course for this.  He now has a PEG tube.  On room air. -Aspiration precaution   Acute respiratory failure with hypoxia due to aspiration pneumonia: Resolved.   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 Continue statins.  Severe dementia without behavioral disturbance: Awake and alert but only oriented to self "hospital".  Follows commands. -Reorientation and delirium precautions   Oral thrush-on statin and Diflucan.   Acute kidney injury - Likely hemodynamic mediated.  Resolved.   Essential hypertension: Normotensive -Continue amlodipine   Hyperlipidemia  -Continue statins.   Dysphagia -TF per PEG tube -Dysphagia 1 diet   Severe malnutrition/dysphagia Body mass index is 20.15 kg/m. Nutrition Problem: Severe Malnutrition Etiology: chronic illness Signs/Symptoms: severe fat depletion, severe muscle depletion Interventions: Tube feeding, MVI, Prostat   DVT prophylaxis:  heparin injection 5,000 Units Start: 05/15/23 1400 SCD's Start: 05/11/23 2019  Code Status: DNR/DNI Family Communication: Updated patient's wife at bedside this  afternoon Level of care: Med-Surg Status is: Inpatient Remains inpatient appropriate because: Lack of safe disposition   Final disposition: LTACH?   35 minutes with  more than 50% spent in reviewing records, counseling patient/family and coordinating care.   Sch Meds:  Scheduled Meds:  amLODipine  10 mg Per Tube Daily   feeding supplement (PROSource TF20)  60 mL Per Tube Daily   fiber supplement (BANATROL TF)  60 mL Per Tube BID   fluconazole  100 mg Per Tube QPM   free water  200 mL Per Tube Q4H   guaiFENesin  15 mL Per Tube Q6H   heparin injection (subcutaneous)  5,000 Units Subcutaneous Q12H   multivitamin with minerals  1 tablet Per Tube Daily   nystatin  5 mL Oral TID AC & HS   mouth rinse  15 mL Mouth Rinse 4 times per day   pantoprazole (PROTONIX) IV  40 mg Intravenous QHS   Continuous Infusions:  feeding supplement (OSMOLITE 1.5 CAL) 1,000 mL (06/11/23 1919)   PRN Meds:.acetaminophen **OR** acetaminophen (TYLENOL) oral liquid 160 mg/5 mL **OR** acetaminophen, glycopyrrolate, labetalol, naphazoline-pheniramine, mouth rinse  Antimicrobials: Anti-infectives (From admission, onward)    Start     Dose/Rate Route Frequency Ordered Stop   06/11/23 1800  fluconazole (DIFLUCAN) tablet 100 mg        100 mg Per Tube Every evening 06/11/23 1526     06/08/23 1600  fluconazole (DIFLUCAN) IVPB 100 mg  Status:  Discontinued        100 mg 50 mL/hr over 60 Minutes Intravenous Every 24 hours 06/07/23 1457 06/11/23 1526   06/07/23 1545  fluconazole (DIFLUCAN) IVPB 200 mg        200 mg 100 mL/hr over 60 Minutes Intravenous  Once 06/07/23 1457 06/07/23 1812   06/07/23 0600  ceFAZolin (ANCEF) IVPB 2g/100 mL premix        2 g 200 mL/hr over 30 Minutes Intravenous  Once 06/06/23 1423 06/07/23 0940   06/02/23 2200  metroNIDAZOLE (FLAGYL) IVPB 500 mg        500 mg 100 mL/hr over 60 Minutes Intravenous Every 12 hours 06/02/23 0908 06/02/23 2246   06/02/23 1400  ceFEPIme (MAXIPIME) 2 g in sodium chloride 0.9 % 100 mL IVPB        2 g 200 mL/hr over 30 Minutes Intravenous Every 8 hours 06/02/23 0908 06/02/23 1454   05/23/23 1130  metroNIDAZOLE (FLAGYL) IVPB  500 mg  Status:  Discontinued        500 mg 100 mL/hr over 60 Minutes Intravenous Every 12 hours 05/23/23 1033 06/02/23 0908   05/22/23 1330  ceFEPIme (MAXIPIME) 2 g in sodium chloride 0.9 % 100 mL IVPB  Status:  Discontinued        2 g 200 mL/hr over 30 Minutes Intravenous Every 8 hours 05/22/23 1257 06/02/23 0908   05/20/23 1415  amoxicillin-clavulanate (AUGMENTIN) 400-57 MG/5ML suspension 500 mg  Status:  Discontinued        500 mg Per Tube Every 8 hours 05/20/23 1323 05/22/23 1257   05/20/23 1000  amoxicillin-clavulanate (AUGMENTIN) 875-125 MG per tablet 1 tablet  Status:  Discontinued        1 tablet Oral Every 12 hours 05/20/23 0716 05/20/23 0723   05/20/23 0815  amoxicillin-clavulanate (AUGMENTIN) 250-62.5 MG/5ML suspension 500 mg  Status:  Discontinued        500 mg Per Tube Every 8 hours 05/20/23 0723 05/20/23 1323   05/17/23 1400  metroNIDAZOLE (FLAGYL) IVPB  500 mg  Status:  Discontinued        500 mg 100 mL/hr over 60 Minutes Intravenous Every 12 hours 05/17/23 1322 05/20/23 0716   05/17/23 1130  linezolid (ZYVOX) IVPB 600 mg  Status:  Discontinued        600 mg 300 mL/hr over 60 Minutes Intravenous Every 12 hours 05/17/23 1043 05/17/23 1322   05/16/23 1100  ceFEPIme (MAXIPIME) 2 g in sodium chloride 0.9 % 100 mL IVPB  Status:  Discontinued        2 g 200 mL/hr over 30 Minutes Intravenous Every 12 hours 05/16/23 1012 05/20/23 0716   05/15/23 1400  cefTRIAXone (ROCEPHIN) 2 g in sodium chloride 0.9 % 100 mL IVPB  Status:  Discontinued        2 g 200 mL/hr over 30 Minutes Intravenous Every 24 hours 05/15/23 1313 05/16/23 1012   05/15/23 1345  cefTRIAXone (ROCEPHIN) 1 g in sodium chloride 0.9 % 100 mL IVPB  Status:  Discontinued        1 g 200 mL/hr over 30 Minutes Intravenous Every 24 hours 05/15/23 1254 05/15/23 1313   05/11/23 2230  cefTRIAXone (ROCEPHIN) 1 g in sodium chloride 0.9 % 100 mL IVPB        1 g 200 mL/hr over 30 Minutes Intravenous Daily at bedtime 05/11/23 2153  05/12/23 2209        I have personally reviewed the following labs and images: CBC: Recent Labs  Lab 06/10/23 0516 06/12/23 0650  WBC 6.4 6.8  HGB 11.4* 13.4  HCT 33.2* 38.3*  MCV 84.1 84.5  PLT 279 256   BMP &GFR Recent Labs  Lab 06/06/23 0622 06/08/23 0822 06/10/23 0516 06/12/23 0650  NA 133* 135 134* 138  K 4.4 4.3 3.6 4.3  CL 98 97* 100 99  CO2 28 29 28 27   GLUCOSE 107* 126* 109* 105*  BUN 25* 25* 23 16  CREATININE 0.68 0.80 0.62 0.59*  CALCIUM 9.1 9.3 8.7* 9.3  MG  --  2.2 2.2 2.3  PHOS  --  4.2 3.4 4.0   Estimated Creatinine Clearance: 57.4 mL/min (A) (by C-G formula based on SCr of 0.59 mg/dL (L)). Liver & Pancreas: Recent Labs  Lab 06/08/23 0822 06/10/23 0516 06/12/23 0650  AST 19 15 21   ALT 19 15 17   ALKPHOS 60 65 79  BILITOT 0.6 0.4 0.3  PROT 6.8 6.6 7.4  ALBUMIN 2.6* 2.6* 2.9*   No results for input(s): "LIPASE", "AMYLASE" in the last 168 hours. No results for input(s): "AMMONIA" in the last 168 hours. Diabetic: No results for input(s): "HGBA1C" in the last 72 hours. Recent Labs  Lab 06/11/23 2004 06/11/23 2342 06/12/23 0438 06/12/23 0756 06/12/23 1142  GLUCAP 104* 98 129* 141* 122*   Cardiac Enzymes: No results for input(s): "CKTOTAL", "CKMB", "CKMBINDEX", "TROPONINI" in the last 168 hours. No results for input(s): "PROBNP" in the last 8760 hours. Coagulation Profile: No results for input(s): "INR", "PROTIME" in the last 168 hours. Thyroid Function Tests: No results for input(s): "TSH", "T4TOTAL", "FREET4", "T3FREE", "THYROIDAB" in the last 72 hours. Lipid Profile: No results for input(s): "CHOL", "HDL", "LDLCALC", "TRIG", "CHOLHDL", "LDLDIRECT" in the last 72 hours. Anemia Panel: No results for input(s): "VITAMINB12", "FOLATE", "FERRITIN", "TIBC", "IRON", "RETICCTPCT" in the last 72 hours. Urine analysis:    Component Value Date/Time   COLORURINE YELLOW 05/17/2023 1430   APPEARANCEUR CLEAR 05/17/2023 1430   APPEARANCEUR Clear  07/23/2021 1535   LABSPEC 1.026 05/17/2023 1430  PHURINE 5.0 05/17/2023 1430   GLUCOSEU NEGATIVE 05/17/2023 1430   HGBUR NEGATIVE 05/17/2023 1430   BILIRUBINUR NEGATIVE 05/17/2023 1430   BILIRUBINUR 1+ 05/04/2023 1015   BILIRUBINUR Negative 07/23/2021 1535   KETONESUR NEGATIVE 05/17/2023 1430   PROTEINUR NEGATIVE 05/17/2023 1430   UROBILINOGEN 0.2 (A) 05/04/2023 1015   NITRITE NEGATIVE 05/17/2023 1430   LEUKOCYTESUR NEGATIVE 05/17/2023 1430   Sepsis Labs: Invalid input(s): "PROCALCITONIN", "LACTICIDVEN"  Microbiology: No results found for this or any previous visit (from the past 240 hours).  Radiology Studies: No results found.    Tessa Seaberry T. Oluwatobi Ruppe [facility]  If 7PM-7AM, please contact night-coverage [email or web address] 06/12/2023, 1:14 PM

## 2023-06-13 DIAGNOSIS — E43 Unspecified severe protein-calorie malnutrition: Secondary | ICD-10-CM | POA: Diagnosis not present

## 2023-06-13 DIAGNOSIS — I610 Nontraumatic intracerebral hemorrhage in hemisphere, subcortical: Secondary | ICD-10-CM | POA: Diagnosis not present

## 2023-06-13 DIAGNOSIS — I629 Nontraumatic intracranial hemorrhage, unspecified: Secondary | ICD-10-CM | POA: Diagnosis not present

## 2023-06-13 DIAGNOSIS — I6389 Other cerebral infarction: Secondary | ICD-10-CM | POA: Diagnosis not present

## 2023-06-13 LAB — GLUCOSE, CAPILLARY
Glucose-Capillary: 121 mg/dL — ABNORMAL HIGH (ref 70–99)
Glucose-Capillary: 123 mg/dL — ABNORMAL HIGH (ref 70–99)
Glucose-Capillary: 123 mg/dL — ABNORMAL HIGH (ref 70–99)
Glucose-Capillary: 129 mg/dL — ABNORMAL HIGH (ref 70–99)
Glucose-Capillary: 130 mg/dL — ABNORMAL HIGH (ref 70–99)
Glucose-Capillary: 201 mg/dL — ABNORMAL HIGH (ref 70–99)

## 2023-06-13 NOTE — Progress Notes (Signed)
 Speech Language Pathology Treatment: Dysphagia  Patient Details Name: Derek Yates MRN: [id number] DOB: [birth year] Today's Date: 06/13/2023 Time: 1448-1500 SLP Time Calculation (min) (ACUTE ONLY): 12 min  Assessment / Plan / Recommendation Clinical Impression  Derek Yates was sitting in recliner, making jokes; Derek Yates was at the bedside.  Lunch tray was in front of him. Mentation and initiation have improved.   Pt able to feed himself, using utensils and drinking from the edge of a cup with assist for tray set-up.  He demonstrates much improved ability to initiate and sustain the oral movement necessary for functional eating.  Straws continue to lead to mild coughing.  He needed only initial verbal cues today for initiation and pacing.  Encourage pt to feed himself as much as able.  Avoid straws for now. SLP will follow.   HPI HPI: Patient is an 83 y.o. male with PMH: dementia, recurrent UTI's, CVA, HTN. He presented to the hospital on 05/11/23 as a code stroke with speech deficits and left sided weakness. 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 Patient has been NPO; Cortrak placed 2/21. MBS 3/10 - recs for NPO due primarily to AMS.  Pt was followed by palliative care; decision had been made to shift to comfort care, then reversed by family and SLP was re-reconsulted to repeat swallow assessment on 3/13. PEG 3/18.      SLP Plan  Continue with current plan of care      Recommendations for follow up therapy are one component of a multi-disciplinary discharge planning process, led by the attending physician.  Recommendations may be updated based on patient status, additional functional criteria and insurance authorization.    Recommendations  Diet recommendations: Dysphagia 1 (puree);Thin liquid Liquids provided via: Cup;Teaspoon Medication Administration: Via  alternative means Supervision: Full supervision/cueing for compensatory strategies Compensations: Slow rate Postural Changes and/or Swallow Maneuvers: Seated upright 90 degrees                  Oral care BID   Frequent or constant Supervision/Assistance Dysphagia, oropharyngeal phase (R13.12)     Continue with current plan of care   Derek Yates L. Derek Frederic, MA CCC/SLP Clinical Specialist - Acute Care SLP Acute Rehabilitation Services Office number [phone]   Derek Yates Derek Yates  06/13/2023, 3:03 PM

## 2023-06-13 NOTE — Plan of Care (Signed)
 Pt alert, oriented to self cooperative, still on tube feeding, tolerated well.   Problem: Education: Goal: Knowledge of General Education information will improve Description: Including pain rating scale, medication(s)/side effects and non-pharmacologic comfort measures Outcome: Progressing   Problem: Clinical Measurements: Goal: Will remain free from infection Outcome: Progressing   Problem: Clinical Measurements: Goal: Respiratory complications will improve Outcome: Progressing   Problem: Nutrition: Goal: Adequate nutrition will be maintained Outcome: Progressing   Problem: Coping: Goal: Level of anxiety will decrease Outcome: Progressing   Problem: Safety: Goal: Ability to remain free from injury will improve Outcome: Progressing   Problem: Skin Integrity: Goal: Risk for impaired skin integrity will decrease Outcome: Progressing

## 2023-06-13 NOTE — Progress Notes (Signed)
 PROGRESS NOTE  Derek Yates ZDG:[id number] DOB: 1940/12/22   PCP: [name], MD  Patient is from: Home.  DOA: 05/11/2023 LOS: 33  Chief complaints No chief complaint on file.    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 He was admitted to neuro ICU, eventually stabilized and transferred to the hospitalist service. Hospital course complicated by aspiration pneumonia, failure to progress, persistent dysphagia and eventually had to have a PEG tube placed on 3/18.  He is also on dysphagia 1 diet.  Insurance denied LTACH even after appeal.  TOC and family working on SNF  Subjective: Seen and examined earlier this morning.  No major events overnight of this morning.  He is awake and alert.  Has no complaints but not a great historian.  Objective: Vitals:   06/12/23 2028 06/12/23 2343 06/13/23 0359 06/13/23 0500  BP: 129/69 127/70 115/72   Pulse: 95 87 81   Resp: 18 18 18    Temp: 98.4 F (36.9 C) 99.2 F (37.3 C) 98.5 F (36.9 C)   TempSrc: Oral Oral Oral   SpO2: 95% 97% 95%   Weight:    61.8 kg  Height:        Examination:  GENERAL: No apparent distress.  Nontoxic. HEENT: MMM.  Vision and hearing grossly intact.  NECK: Supple.  No apparent JVD.  RESP:  No IWOB.  Fair aeration bilaterally. CVS:  RRR. Heart sounds normal.  ABD/GI/GU: BS+. Abd soft, NTND.  G-tube in place. MSK/EXT:  Moves extremities. No apparent deformity. No edema.  Bilateral mittens. SKIN: no apparent skin lesion or wound NEURO: Wake and alert.  Oriented to self and "hospital".  Follows commands.  No apparent focal neuro deficit. PSYCH: Calm. Normal affect.   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  Given underlying dementia palliative was consulted, GOC discussions were held and ongoing medical care has been established.  Due to persistent dysphagia, PEG tube placed by general surgery on 3/18.  Also upgraded to dysphagia 1 diet. -Continue tube feeds, now at goal.   -LTACH was pursued, insurance denied even after appeal.   Recurrent aspiration pneumonia: Completed antibiotic course for this.  He now has a PEG tube.  On room air. -Aspiration precaution   Acute respiratory failure with hypoxia due to aspiration pneumonia: Resolved.   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 Continue statins.  Severe dementia without behavioral disturbance: Awake and alert but only oriented to self "hospital".  Follows commands. -Reorientation and delirium precautions   Oral thrush -Received Diflucan from 3/16-3/24. -Continue nystatin suspension.   Acute kidney injury - Likely hemodynamic mediated.  Resolved.   Essential hypertension: Normotensive -Continue amlodipine   Hyperlipidemia  -Continue statins.   Dysphagia -TF per PEG tube -Dysphagia 1 diet   Severe malnutrition/dysphagia Body mass index is 20.72 kg/m. Nutrition Problem: Severe Malnutrition Etiology: chronic illness Signs/Symptoms: severe fat depletion, severe muscle depletion Interventions: Tube feeding, MVI, Prostat   DVT prophylaxis:  heparin injection 5,000 Units Start: 05/15/23 1400 SCD's Start:  05/11/23 2019  Code Status: DNR/DNI Family Communication: None at bedside today. Level of care: Med-Surg Status is: Inpatient Remains inpatient appropriate because: Lack of safe  disposition   Final disposition: SNF  35 minutes with more than 50% spent in reviewing records, counseling patient/family and coordinating care.   Sch Meds:  Scheduled Meds:  amLODipine  10 mg Per Tube Daily   feeding supplement (PROSource TF20)  60 mL Per Tube Daily   fiber supplement (BANATROL TF)  60 mL Per Tube BID   free water  200 mL Per Tube Q4H   guaiFENesin  15 mL Per Tube Q6H   heparin injection (subcutaneous)  5,000 Units Subcutaneous Q12H   multivitamin with minerals  1 tablet Per Tube Daily   nystatin  5 mL Oral TID AC & HS   mouth rinse  15 mL Mouth Rinse 4 times per day   pantoprazole (PROTONIX) IV  40 mg Intravenous QHS   Continuous Infusions:  feeding supplement (OSMOLITE 1.5 CAL) 1,000 mL (06/12/23 1733)   PRN Meds:.acetaminophen **OR** acetaminophen (TYLENOL) oral liquid 160 mg/5 mL **OR** acetaminophen, glycopyrrolate, labetalol, naphazoline-pheniramine, mouth rinse  Antimicrobials: Anti-infectives (From admission, onward)    Start     Dose/Rate Route Frequency Ordered Stop   06/11/23 1800  fluconazole (DIFLUCAN) tablet 100 mg  Status:  Discontinued        100 mg Per Tube Every evening 06/11/23 1526 06/13/23 1052   06/08/23 1600  fluconazole (DIFLUCAN) IVPB 100 mg  Status:  Discontinued        100 mg 50 mL/hr over 60 Minutes Intravenous Every 24 hours 06/07/23 1457 06/11/23 1526   06/07/23 1545  fluconazole (DIFLUCAN) IVPB 200 mg        200 mg 100 mL/hr over 60 Minutes Intravenous  Once 06/07/23 1457 06/07/23 1812   06/07/23 0600  ceFAZolin (ANCEF) IVPB 2g/100 mL premix        2 g 200 mL/hr over 30 Minutes Intravenous  Once 06/06/23 1423 06/07/23 0940   06/02/23 2200  metroNIDAZOLE (FLAGYL) IVPB 500 mg        500 mg 100 mL/hr over 60 Minutes Intravenous Every 12 hours 06/02/23 0908 06/02/23 2246   06/02/23 1400  ceFEPIme (MAXIPIME) 2 g in sodium chloride 0.9 % 100 mL IVPB        2 g 200 mL/hr over 30 Minutes Intravenous Every 8 hours 06/02/23 0908  06/02/23 1454   05/23/23 1130  metroNIDAZOLE (FLAGYL) IVPB 500 mg  Status:  Discontinued        500 mg 100 mL/hr over 60 Minutes Intravenous Every 12 hours 05/23/23 1033 06/02/23 0908   05/22/23 1330  ceFEPIme (MAXIPIME) 2 g in sodium chloride 0.9 % 100 mL IVPB  Status:  Discontinued        2 g 200 mL/hr over 30 Minutes Intravenous Every 8 hours 05/22/23 1257 06/02/23 0908   05/20/23 1415  amoxicillin-clavulanate (AUGMENTIN) 400-57 MG/5ML suspension 500 mg  Status:  Discontinued        500 mg Per Tube Every 8 hours 05/20/23 1323 05/22/23 1257   05/20/23 1000  amoxicillin-clavulanate (AUGMENTIN) 875-125 MG per tablet 1 tablet  Status:  Discontinued        1 tablet Oral Every 12 hours 05/20/23 0716 05/20/23 0723   05/20/23 0815  amoxicillin-clavulanate (AUGMENTIN) 250-62.5 MG/5ML suspension 500 mg  Status:  Discontinued        500 mg Per Tube Every 8 hours 05/20/23 0723 05/20/23 1323   05/17/23  1400  metroNIDAZOLE (FLAGYL) IVPB 500 mg  Status:  Discontinued        500 mg 100 mL/hr over 60 Minutes Intravenous Every 12 hours 05/17/23 1322 05/20/23 0716   05/17/23 1130  linezolid (ZYVOX) IVPB 600 mg  Status:  Discontinued        600 mg 300 mL/hr over 60 Minutes Intravenous Every 12 hours 05/17/23 1043 05/17/23 1322   05/16/23 1100  ceFEPIme (MAXIPIME) 2 g in sodium chloride 0.9 % 100 mL IVPB  Status:  Discontinued        2 g 200 mL/hr over 30 Minutes Intravenous Every 12 hours 05/16/23 1012 05/20/23 0716   05/15/23 1400  cefTRIAXone (ROCEPHIN) 2 g in sodium chloride 0.9 % 100 mL IVPB  Status:  Discontinued        2 g 200 mL/hr over 30 Minutes Intravenous Every 24 hours 05/15/23 1313 05/16/23 1012   05/15/23 1345  cefTRIAXone (ROCEPHIN) 1 g in sodium chloride 0.9 % 100 mL IVPB  Status:  Discontinued        1 g 200 mL/hr over 30 Minutes Intravenous Every 24 hours 05/15/23 1254 05/15/23 1313   05/11/23 2230  cefTRIAXone (ROCEPHIN) 1 g in sodium chloride 0.9 % 100 mL IVPB        1 g 200 mL/hr  over 30 Minutes Intravenous Daily at bedtime 05/11/23 2153 05/12/23 2209        I have personally reviewed the following labs and images: CBC: Recent Labs  Lab 06/10/23 0516 06/12/23 0650  WBC 6.4 6.8  HGB 11.4* 13.4  HCT 33.2* 38.3*  MCV 84.1 84.5  PLT 279 256   BMP &GFR Recent Labs  Lab 06/08/23 0822 06/10/23 0516 06/12/23 0650  NA 135 134* 138  K 4.3 3.6 4.3  CL 97* 100 99  CO2 29 28 27   GLUCOSE 126* 109* 105*  BUN 25* 23 16  CREATININE 0.80 0.62 0.59*  CALCIUM 9.3 8.7* 9.3  MG 2.2 2.2 2.3  PHOS 4.2 3.4 4.0   Estimated Creatinine Clearance: 59 mL/min (A) (by C-G formula based on SCr of 0.59 mg/dL (L)). Liver & Pancreas: Recent Labs  Lab 06/08/23 0822 06/10/23 0516 06/12/23 0650  AST 19 15 21   ALT 19 15 17   ALKPHOS 60 65 79  BILITOT 0.6 0.4 0.3  PROT 6.8 6.6 7.4  ALBUMIN 2.6* 2.6* 2.9*   No results for input(s): "LIPASE", "AMYLASE" in the last 168 hours. No results for input(s): "AMMONIA" in the last 168 hours. Diabetic: No results for input(s): "HGBA1C" in the last 72 hours. Recent Labs  Lab 06/12/23 1609 06/12/23 2054 06/13/23 0004 06/13/23 0435 06/13/23 0802  GLUCAP 169* 138* 129* 130* 121*   Cardiac Enzymes: No results for input(s): "CKTOTAL", "CKMB", "CKMBINDEX", "TROPONINI" in the last 168 hours. No results for input(s): "PROBNP" in the last 8760 hours. Coagulation Profile: No results for input(s): "INR", "PROTIME" in the last 168 hours. Thyroid Function Tests: No results for input(s): "TSH", "T4TOTAL", "FREET4", "T3FREE", "THYROIDAB" in the last 72 hours. Lipid Profile: No results for input(s): "CHOL", "HDL", "LDLCALC", "TRIG", "CHOLHDL", "LDLDIRECT" in the last 72 hours. Anemia Panel: No results for input(s): "VITAMINB12", "FOLATE", "FERRITIN", "TIBC", "IRON", "RETICCTPCT" in the last 72 hours. Urine analysis:    Component Value Date/Time   COLORURINE YELLOW 05/17/2023 1430   APPEARANCEUR CLEAR 05/17/2023 1430   APPEARANCEUR  Clear 07/23/2021 1535   LABSPEC 1.026 05/17/2023 1430   PHURINE 5.0 05/17/2023 1430   GLUCOSEU NEGATIVE 05/17/2023 1430  HGBUR NEGATIVE 05/17/2023 1430   BILIRUBINUR NEGATIVE 05/17/2023 1430   BILIRUBINUR 1+ 05/04/2023 1015   BILIRUBINUR Negative 07/23/2021 1535   KETONESUR NEGATIVE 05/17/2023 1430   PROTEINUR NEGATIVE 05/17/2023 1430   UROBILINOGEN 0.2 (A) 05/04/2023 1015   NITRITE NEGATIVE 05/17/2023 1430   LEUKOCYTESUR NEGATIVE 05/17/2023 1430   Sepsis Labs: Invalid input(s): "PROCALCITONIN", "LACTICIDVEN"  Microbiology: No results found for this or any previous visit (from the past 240 hours).  Radiology Studies: No results found.    Jacqulyn Barresi T. Liset Mcmonigle [facility]  If 7PM-7AM, please contact night-coverage [email or web address] 06/13/2023, 10:53 AM

## 2023-06-13 NOTE — Progress Notes (Signed)
 Physical Therapy Treatment Patient Details Name: OSLO HUNTSMAN MRN: [id number] DOB: 12-01-1940 Today's Date: 06/13/2023   History of Present Illness 83 yo male presents to [facility] on 2/19 as code stroke for speech deficits, L weakness. 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 PEG tube placement on 06/07/2023. PMH includes dementia, recurrent UTIs, CVA, HTN.    PT Comments  Patient agreeable to participate with therapy, wife was present and supportive during session. Patient was able to tolerate sitting EOB for ~20-76min under supervision-CGA. Once EOB, patient able to weightshift laterally to correct left-posterior lean. Practiced truncal rotation intervention to the R to improve tracking with head turns and correct posture. Patient requires MaxA +2 for bed mobility and for transfers due to lack of LE strength needed for functional activities. Therapy transferred the patient from bed > chair totalA +2 via stedy equipment. Acute PT will continue to follow up to maximize his functional independence and  will benefit from continued inpatient follow up therapy, <3 hours/day.         If plan is discharge home, recommend the following: Two people to help with walking and/or transfers;Two people to help with bathing/dressing/bathroom;A lot of help with walking and/or transfers;Assistance with cooking/housework;Assistance with feeding;Assist for transportation;Help with stairs or ramp for entrance   Can travel by private vehicle     No  Equipment Recommendations  Wheelchair (measurements PT);Wheelchair cushion (measurements PT);Hospital bed;Hoyer lift    Recommendations for Other Services       Precautions / Restrictions Precautions Precautions: Fall Recall of Precautions/Restrictions: Impaired Precaution/Restrictions Comments: PEG tube; abdominal binder, bilateral mitts Restrictions Weight Bearing Restrictions Per Provider Order: No     Mobility  Bed  Mobility Overal bed mobility: Needs Assistance Bed Mobility: Supine to Sit     Supine to sit: Max assist, +2 for physical assistance, Used rails     General bed mobility comments: Pt with good initiation with moving RLE OOB and crossing midline with RUE to grasp bed rail. Pt required verbal/tactile cues for sequencing and assistance elevating trunk and scooting forward until B feet were flat on floor    Transfers Overall transfer level: Needs assistance Equipment used: Ambulation equipment used Transfers: Sit to/from Stand, Bed to chair/wheelchair/BSC Sit to Stand: Via lift equipment, Max assist, +2 physical assistance           General transfer comment: MaxA +2 for power up, rise, steady, and maintaining upright Transfer via Lift Equipment: Stedy  Ambulation/Gait                   Stairs             Wheelchair Mobility     Tilt Bed    Modified Rankin (Stroke Patients Only)       Balance Overall balance assessment: Needs assistance Sitting-balance support: Bilateral upper extremity supported, Feet supported Sitting balance-Leahy Scale: Fair Sitting balance - Comments: Pt initially leaning to left but after cues and positioning EOB, pt able to progress to supervision Postural control: Posterior lean, Left lateral lean Standing balance support: Reliant on assistive device for balance Standing balance-Leahy Scale: Zero Standing balance comment: dependent on external support                            Communication Communication Communication: Impaired Factors Affecting Communication: Reduced clarity of speech  Cognition Arousal: Alert Behavior During Therapy: [facility] for tasks assessed/performed  Following commands: Impaired Following commands impaired: Follows one step commands with increased time    Cueing Cueing Techniques: Verbal cues, Visual cues, Gestural cues, Tactile cues  Exercises Other  Exercises Other Exercises: R Truncal Rotation x5    General Comments        Pertinent Vitals/Pain Pain Assessment Pain Assessment: No/denies pain    Home Living                          Prior Function            PT Goals (current goals can now be found in the care plan section) Acute Rehab PT Goals Patient Stated Goal: based on subjective intake during initial eval, son wants his dad to be more alert PT Goal Formulation: With patient/family Time For Goal Achievement: 06/24/23 Potential to Achieve Goals: Fair Progress towards PT goals: Progressing toward goals    Frequency    Min 2X/week      PT Plan      Co-evaluation              AM-PAC PT "6 Clicks" Mobility   Outcome Measure  Help needed turning from your back to your side while in a flat bed without using bedrails?: A Lot Help needed moving from lying on your back to sitting on the side of a flat bed without using bedrails?: A Lot Help needed moving to and from a bed to a chair (including a wheelchair)?: Total Help needed standing up from a chair using your arms (e.g., wheelchair or bedside chair)?: Total Help needed to walk in hospital room?: Total Help needed climbing 3-5 steps with a railing? : Total 6 Click Score: 8    End of Session Equipment Utilized During Treatment: Gait belt Activity Tolerance: Patient tolerated treatment well Patient left: in chair;with call bell/phone within reach;with chair alarm set;with family/visitor present Nurse Communication: Mobility status;Need for lift equipment PT Visit Diagnosis: Other abnormalities of gait and mobility (R26.89);Muscle weakness (generalized) (M62.81)     Time: 1610-9604 PT Time Calculation (min) (ACUTE ONLY): 43 min  Charges:    $Therapeutic Activity: 38-52 mins PT General Charges $$ ACUTE PT VISIT: 1 Visit                     Doreen Beam, SPT   Rosaleen Mazer 06/13/2023, 4:50 PM

## 2023-06-13 NOTE — Plan of Care (Signed)
  Problem: Education: Goal: Knowledge of General Education information will improve Description: Including pain rating scale, medication(s)/side effects and non-pharmacologic comfort measures Outcome: Progressing   Problem: Health Behavior/Discharge Planning: Goal: Ability to manage health-related needs will improve Outcome: Progressing   Problem: Clinical Measurements: Goal: Ability to maintain clinical measurements within normal limits will improve Outcome: Progressing Goal: Will remain free from infection Outcome: Progressing Goal: Diagnostic test results will improve Outcome: Progressing Goal: Respiratory complications will improve Outcome: Progressing Goal: Cardiovascular complication will be avoided Outcome: Progressing   Problem: Activity: Goal: Risk for activity intolerance will decrease Outcome: Progressing   Problem: Nutrition: Goal: Adequate nutrition will be maintained Outcome: Progressing   Problem: Coping: Goal: Level of anxiety will decrease Outcome: Progressing   Problem: Elimination: Goal: Will not experience complications related to bowel motility Outcome: Progressing Goal: Will not experience complications related to urinary retention Outcome: Progressing   Problem: Pain Managment: Goal: General experience of comfort will improve and/or be controlled Outcome: Progressing   Problem: Safety: Goal: Ability to remain free from injury will improve Outcome: Progressing   Problem: Skin Integrity: Goal: Risk for impaired skin integrity will decrease Outcome: Progressing   Problem: Education: Goal: Knowledge of disease or condition will improve Outcome: Progressing Goal: Knowledge of secondary prevention will improve (MUST DOCUMENT ALL) Outcome: Progressing Goal: Knowledge of patient specific risk factors will improve (DELETE if not current risk factor) Outcome: Progressing   Problem: Intracerebral Hemorrhage Tissue Perfusion: Goal: Complications  of Intracerebral Hemorrhage will be minimized Outcome: Progressing   Problem: Coping: Goal: Will verbalize positive feelings about self Outcome: Progressing Goal: Will identify appropriate support needs Outcome: Progressing   Problem: Health Behavior/Discharge Planning: Goal: Ability to manage health-related needs will improve Outcome: Progressing Goal: Goals will be collaboratively established with patient/family Outcome: Progressing   Problem: Self-Care: Goal: Ability to participate in self-care as condition permits will improve Outcome: Progressing Goal: Verbalization of feelings and concerns over difficulty with self-care will improve Outcome: Progressing Goal: Ability to communicate needs accurately will improve Outcome: Progressing   Problem: Nutrition: Goal: Risk of aspiration will decrease Outcome: Progressing Goal: Dietary intake will improve Outcome: Progressing   Problem: Education: Goal: Ability to describe self-care measures that may prevent or decrease complications (Diabetes Survival Skills Education) will improve Outcome: Progressing   Problem: Coping: Goal: Ability to adjust to condition or change in health will improve Outcome: Progressing   Problem: Fluid Volume: Goal: Ability to maintain a balanced intake and output will improve Outcome: Progressing   Problem: Health Behavior/Discharge Planning: Goal: Ability to identify and utilize available resources and services will improve Outcome: Progressing Goal: Ability to manage health-related needs will improve Outcome: Progressing   Problem: Metabolic: Goal: Ability to maintain appropriate glucose levels will improve Outcome: Progressing   Problem: Nutritional: Goal: Maintenance of adequate nutrition will improve Outcome: Progressing Goal: Progress toward achieving an optimal weight will improve Outcome: Progressing   Problem: Skin Integrity: Goal: Risk for impaired skin integrity will  decrease Outcome: Progressing   Problem: Tissue Perfusion: Goal: Adequacy of tissue perfusion will improve Outcome: Progressing

## 2023-06-14 DIAGNOSIS — I6389 Other cerebral infarction: Secondary | ICD-10-CM | POA: Diagnosis not present

## 2023-06-14 DIAGNOSIS — E43 Unspecified severe protein-calorie malnutrition: Secondary | ICD-10-CM | POA: Diagnosis not present

## 2023-06-14 DIAGNOSIS — I629 Nontraumatic intracranial hemorrhage, unspecified: Secondary | ICD-10-CM | POA: Diagnosis not present

## 2023-06-14 DIAGNOSIS — I610 Nontraumatic intracerebral hemorrhage in hemisphere, subcortical: Secondary | ICD-10-CM | POA: Diagnosis not present

## 2023-06-14 LAB — GLUCOSE, CAPILLARY
Glucose-Capillary: 115 mg/dL — ABNORMAL HIGH (ref 70–99)
Glucose-Capillary: 125 mg/dL — ABNORMAL HIGH (ref 70–99)
Glucose-Capillary: 125 mg/dL — ABNORMAL HIGH (ref 70–99)
Glucose-Capillary: 126 mg/dL — ABNORMAL HIGH (ref 70–99)
Glucose-Capillary: 127 mg/dL — ABNORMAL HIGH (ref 70–99)
Glucose-Capillary: 132 mg/dL — ABNORMAL HIGH (ref 70–99)
Glucose-Capillary: 153 mg/dL — ABNORMAL HIGH (ref 70–99)

## 2023-06-14 MED ORDER — BANATROL TF EN LIQD: 60.0000 mL | Freq: Two times a day (BID) | ENTERAL | Status: DC

## 2023-06-14 MED ORDER — AMLODIPINE BESYLATE 10 MG PO TABS: 10.0000 mg | ORAL_TABLET | Freq: Every day | ORAL | Status: DC

## 2023-06-14 MED ORDER — OSMOLITE 1.5 CAL PO LIQD: 50.0000 mL/h | ORAL | Status: DC

## 2023-06-14 MED ORDER — PROSOURCE TF20 ENFIT COMPATIBL EN LIQD: 60.0000 mL | Freq: Every day | ENTERAL | Status: DC

## 2023-06-14 MED ORDER — ACETAMINOPHEN 160 MG/5ML PO SOLN: 650.0000 mg | ORAL | Status: DC | PRN

## 2023-06-14 MED ORDER — ATORVASTATIN CALCIUM 10 MG PO TABS: 10.0000 mg | ORAL_TABLET | Freq: Every day | ORAL | Status: DC

## 2023-06-14 MED ORDER — FREE WATER: 200.0000 mL | Status: DC

## 2023-06-14 MED ORDER — ADULT MULTIVITAMIN W/MINERALS CH: 1.0000 | ORAL_TABLET | Freq: Every day | ORAL | Status: DC

## 2023-06-14 NOTE — Progress Notes (Signed)
 Nutrition Follow-up  DOCUMENTATION CODES:   Severe malnutrition in context of chronic illness  INTERVENTION:  Continue tube feeding via G-tube : Osmolite 1.5 at 50 ml/h (1200 ml per day)  Free water 200 mL q4h  Provides 1880 kcal, 95 gm protein, 2114 ml free water daily  Consider bolus feeds. If wanting to change regimen, recommend 300 mL of Osmolite 1.5 QID. Flush tube with a minimum of 30 mL before and after feeds to maintain tube patency.   NUTRITION DIAGNOSIS:   Severe Malnutrition related to chronic illness as evidenced by severe fat depletion, severe muscle depletion.  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  2/19 admitted w/ R caudate bleed 2/21 - s/p cortrak placement; tip gastric  2/22 - bedside swallow: NPO, CT head, CXR 2/25 - CT of chest: PNA 2/26 - transfer to 4NP 2/28 - FMS and flatus pouch removed 3/7 - unable to complete MBSS 3/10- MBSS: decline in function 3/11 - swallow edu completed w/ patient's son 3/12 - referral made to residential hospice 3/13 - family would like to pursue PEG. LTACH placement 3/14 - IR: pt not candidate for PEG 3/18 - G-tube placement per surgery, re-initiate TFs  3/20 - MBSS: NPO 3/21 - TF rate increased 3/22- SLP recommends Dys 1 diet 3/24- awaiting SNF placement  Pt sleeping at time of RD interview and no family or visitors present in room. Lunch tray observed with 25% completion. SLP notes pt is able to feed self with spoon. Per chart review, pt eating applesauce and pudding and foods from home. Weights remain stable. Will plan to continue tube feeds until food intake is consistently adequate.   Intake: Nursing flowsheets  document meal completions between 10-35%.  Medications reviewed and include: fiber supplement BID, MVI with minerals  Labs reviewed.  NUTRITION - FOCUSED PHYSICAL EXAM:  Flowsheet Row Most Recent Value  Orbital Region Severe depletion  Upper Arm Region Severe depletion  Thoracic and Lumbar Region Severe depletion  Buccal Region Severe depletion  Temple Region Severe depletion  Clavicle Bone Region Severe depletion  Clavicle and Acromion Bone Region Severe depletion  Scapular Bone Region Severe depletion  Dorsal Hand Severe depletion  Patellar Region Severe depletion  Anterior Thigh Region Severe depletion  Posterior Calf Region Severe depletion  Edema (RD Assessment) None  Hair Reviewed  Eyes Reviewed  Mouth Reviewed  Skin Reviewed  Nails Reviewed       Diet Order:   Diet Order             DIET - DYS 1 Room service appropriate? Yes with Assist; Fluid consistency: Thin  Diet effective now                   EDUCATION NEEDS:   Not appropriate for education at this time  Skin:  Skin Assessment: Reviewed RN Assessment (abdominal incision)  Last BM:  3/25 T6 + T7  Height:   Ht Readings from Last 1 Encounters:  06/07/23 5\' 8"  (1.727 m)    Weight:   Wt Readings from Last 1 Encounters:  06/14/23 60.8 kg    Ideal Body Weight:  70 kg  BMI:  Body mass index  is 20.38 kg/m.  Estimated Nutritional Needs:   Kcal:  1750-1950  Protein:  90-110  Fluid:  1 mL/kcal  Jailene Cupit, MPH, RD, LDN Clinical Dietitian Contact information can be found at [facility].

## 2023-06-14 NOTE — Plan of Care (Signed)
  Problem: Education: Goal: Knowledge of General Education information will improve Description: Including pain rating scale, medication(s)/side effects and non-pharmacologic comfort measures Outcome: Progressing   Problem: Health Behavior/Discharge Planning: Goal: Ability to manage health-related needs will improve Outcome: Progressing   Problem: Clinical Measurements: Goal: Ability to maintain clinical measurements within normal limits will improve Outcome: Progressing Goal: Will remain free from infection Outcome: Progressing Goal: Diagnostic test results will improve Outcome: Progressing Goal: Respiratory complications will improve Outcome: Progressing Goal: Cardiovascular complication will be avoided Outcome: Progressing   Problem: Activity: Goal: Risk for activity intolerance will decrease Outcome: Progressing   Problem: Nutrition: Goal: Adequate nutrition will be maintained Outcome: Progressing   Problem: Coping: Goal: Level of anxiety will decrease Outcome: Progressing   Problem: Elimination: Goal: Will not experience complications related to bowel motility Outcome: Progressing Goal: Will not experience complications related to urinary retention Outcome: Progressing   Problem: Pain Managment: Goal: General experience of comfort will improve and/or be controlled Outcome: Progressing   Problem: Safety: Goal: Ability to remain free from injury will improve Outcome: Progressing   Problem: Skin Integrity: Goal: Risk for impaired skin integrity will decrease Outcome: Progressing   Problem: Education: Goal: Knowledge of disease or condition will improve Outcome: Progressing Goal: Knowledge of secondary prevention will improve (MUST DOCUMENT ALL) Outcome: Progressing Goal: Knowledge of patient specific risk factors will improve (DELETE if not current risk factor) Outcome: Progressing   Problem: Intracerebral Hemorrhage Tissue Perfusion: Goal: Complications  of Intracerebral Hemorrhage will be minimized Outcome: Progressing   Problem: Coping: Goal: Will verbalize positive feelings about self Outcome: Progressing Goal: Will identify appropriate support needs Outcome: Progressing   Problem: Health Behavior/Discharge Planning: Goal: Ability to manage health-related needs will improve Outcome: Progressing Goal: Goals will be collaboratively established with patient/family Outcome: Progressing   Problem: Self-Care: Goal: Ability to participate in self-care as condition permits will improve Outcome: Progressing Goal: Verbalization of feelings and concerns over difficulty with self-care will improve Outcome: Progressing Goal: Ability to communicate needs accurately will improve Outcome: Progressing   Problem: Nutrition: Goal: Risk of aspiration will decrease Outcome: Progressing Goal: Dietary intake will improve Outcome: Progressing   Problem: Education: Goal: Ability to describe self-care measures that may prevent or decrease complications (Diabetes Survival Skills Education) will improve Outcome: Progressing   Problem: Coping: Goal: Ability to adjust to condition or change in health will improve Outcome: Progressing   Problem: Fluid Volume: Goal: Ability to maintain a balanced intake and output will improve Outcome: Progressing   Problem: Health Behavior/Discharge Planning: Goal: Ability to identify and utilize available resources and services will improve Outcome: Progressing Goal: Ability to manage health-related needs will improve Outcome: Progressing   Problem: Metabolic: Goal: Ability to maintain appropriate glucose levels will improve Outcome: Progressing   Problem: Nutritional: Goal: Maintenance of adequate nutrition will improve Outcome: Progressing Goal: Progress toward achieving an optimal weight will improve Outcome: Progressing   Problem: Skin Integrity: Goal: Risk for impaired skin integrity will  decrease Outcome: Progressing   Problem: Tissue Perfusion: Goal: Adequacy of tissue perfusion will improve Outcome: Progressing

## 2023-06-14 NOTE — Plan of Care (Signed)

## 2023-06-14 NOTE — TOC Progression Note (Signed)
 Transition of Care [facility]) - Progression Note    Patient Details  Name: Derek Yates MRN: [id number] Date of Birth: 1940/08/31  [facility]) CM/SW Contact  [name], [place] Phone Number: 06/14/2023, 3:18 PM  Clinical Narrative:   CSW spoke with patient's son, Derek Yates, to discuss disposition. Son very encouraged by the patient's progress over the weekend, but doesn't want the hospital to just "kick him out". Son expressed concern that he feels like everyone has just given up on the patient because of his age and not actually given him a chance, and now that he's been given some nutrition, he is waking up and he has the will to try to get better. CSW discussed disposition options, and son is in agreement that the patient would not be able to tolerate CIR. Family's first preference in SNF would be [facility]. CSW discussed awaiting to see how patient does with OT today and then sending referral to [facility] for review, son in agreement. CSW to follow.    Expected Discharge Plan: Skilled Nursing Facility Barriers to Discharge: Continued Medical Work up, SNF Pending bed offer, English as a second language teacher  Expected Discharge Plan and Services   Discharge Planning Services: CM Consult Post Acute Care Choice: Hospice Living arrangements for the past 2 months: Single Family Home                             [facility] Agency: [facility] Date [facility] Agency Contacted: 05/31/23   Representative spoke with at [facility] Agency: Efraim Kaufmann   Social Determinants of Health (SDOH) Interventions SDOH Screenings   Food Insecurity: Patient Unable To Answer (05/13/2023)  Housing: Patient Unable To Answer (05/13/2023)  Transportation Needs: Patient Unable To Answer (05/13/2023)  Utilities: Patient Unable To Answer (05/13/2023)  Depression (PHQ2-9): Medium Risk (02/10/2023)  Financial Resource Strain: Low Risk  (06/20/2021)   Received from [facility], [facility]  Social  Connections: Patient Unable To Answer (05/13/2023)  Tobacco Use: Low Risk  (06/07/2023)    Readmission Risk Interventions     No data to display

## 2023-06-14 NOTE — TOC Progression Note (Signed)
 Transition of Care [facility]) - Progression Note    Patient Details  Name: Derek Yates MRN: [id number] Date of Birth: 1940-06-03  Transition of Care [facility]) CM/SW Contact  [name], [place] Phone Number: 06/14/2023, 10:24 AM  Clinical Narrative:   CSW received update from [facility] working with patient on previous unit that family was hopeful for an update to CIR for disposition due to patient's improvements over the weekend. CSW contacted Acute Rehab to request patient be seen by therapy for evaluation of any recommendation changes. CSW to follow.    Expected Discharge Plan: Skilled Nursing Facility Barriers to Discharge: Continued Medical Work up, SNF Pending bed offer, English as a second language teacher  Expected Discharge Plan and Services   Discharge Planning Services: CM Consult Post Acute Care Choice: Hospice Living arrangements for the past 2 months: Single Family Home                             [facility] Agency: [facility] Date [facility] Agency Contacted: 05/31/23   Representative spoke with at [facility] Agency: Efraim Kaufmann   Social Determinants of Health (SDOH) Interventions SDOH Screenings   Food Insecurity: Patient Unable To Answer (05/13/2023)  Housing: Patient Unable To Answer (05/13/2023)  Transportation Needs: Patient Unable To Answer (05/13/2023)  Utilities: Patient Unable To Answer (05/13/2023)  Depression (PHQ2-9): Medium Risk (02/10/2023)  Financial Resource Strain: Low Risk  (06/20/2021)   Received from [facility], [facility]  Social Connections: Patient Unable To Answer (05/13/2023)  Tobacco Use: Low Risk  (06/07/2023)    Readmission Risk Interventions     No data to display

## 2023-06-14 NOTE — Progress Notes (Signed)
 PROGRESS NOTE  Derek Yates WJX:[id number] DOB: 13-Feb-1941   PCP: [name], MD  Patient is from: Home.  DOA: 05/11/2023 LOS: 34  Chief complaints No chief complaint on file.    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 He was admitted to neuro ICU, eventually stabilized and transferred to the hospitalist service. Hospital course complicated by aspiration pneumonia, failure to progress, persistent dysphagia and eventually had to have a PEG tube placed on 3/18.  He is also on dysphagia 1 diet.  Insurance denied LTACH even after appeal.  TOC and family working on SNF  Subjective: Seen and examined earlier this morning.  He was very sleepy this morning.  He is awake and alert this afternoon.  Has no complaints other than difficulty sleeping due to frequent interruption  Objective: Vitals:   06/14/23 0431 06/14/23 0500 06/14/23 0751 06/14/23 1150  BP: 124/67  111/62 133/85  Pulse: 97  82 97  Resp: 17  16 17   Temp: 98 F (36.7 C)  97.9 F (36.6 C) 98.6 F (37 C)  TempSrc:   Axillary Oral  SpO2: 97%  99% 95%  Weight:  60.8 kg    Height:        Examination:  GENERAL: No apparent distress.  Nontoxic. HEENT: MMM.  Vision and hearing grossly intact.  NECK: Supple.  No apparent JVD.  RESP:  No IWOB.  Fair aeration bilaterally. CVS:  RRR. Heart sounds normal.  ABD/GI/GU: BS+. Abd soft, NTND.  G-tube in place. MSK/EXT:  Moves extremities. No apparent deformity. No edema.  SKIN: no apparent skin lesion or wound NEURO: Awake and alert.  Oriented to self and "hospital".  Follows commands. PSYCH: Calm. Normal affect.   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  Given underlying dementia palliative was consulted, GOC discussions were held and ongoing medical care has been established.  Due to persistent dysphagia, PEG tube placed by general surgery on 3/18.  Also upgraded to dysphagia 1 diet. -Continue tube feeds, now at goal.   -LTACH was pursued, insurance denied even after appeal. -TOC working on SNF.   Recurrent aspiration pneumonia: Completed antibiotic course for this.  He now has a PEG tube.  On room air. -Aspiration precaution   Acute respiratory failure with hypoxia due to aspiration pneumonia: Resolved.   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 Continue statins.  Severe dementia without behavioral disturbance: Awake and alert but only oriented to self "hospital".  Follows commands. -Reorientation and delirium precautions   Oral thrush -Received Diflucan from 3/16-3/24. -Continue nystatin suspension.   Acute kidney injury - Likely hemodynamic mediated.  Resolved.   Essential hypertension: Normotensive -Continue amlodipine   Hyperlipidemia  -Continue statins.   Dysphagia -TF per PEG tube -Dysphagia 1 diet   Severe malnutrition/dysphagia Body mass index is 20.38 kg/m. Nutrition Problem: Severe Malnutrition Etiology: chronic illness Signs/Symptoms: severe fat depletion, severe muscle depletion Interventions: Tube feeding, MVI, Prostat   DVT prophylaxis:  heparin injection 5,000 Units Start: 05/15/23 1400 SCD's Start:  05/11/23 2019  Code Status: DNR/DNI Family Communication: None at bedside today. Level of care: Med-Surg Status is: Inpatient Remains inpatient appropriate because: Lack of safe  disposition   Final disposition: SNF  35 minutes with more than 50% spent in reviewing records, counseling patient/family and coordinating care.   Sch Meds:  Scheduled Meds:  amLODipine  10 mg Per Tube Daily   feeding supplement (PROSource TF20)  60 mL Per Tube Daily   fiber supplement (BANATROL TF)  60 mL Per Tube BID   free water  200 mL Per Tube Q4H   guaiFENesin  15 mL Per Tube Q6H   heparin injection (subcutaneous)  5,000 Units Subcutaneous Q12H   multivitamin with minerals  1 tablet Per Tube Daily   nystatin  5 mL Oral TID AC & HS   mouth rinse  15 mL Mouth Rinse 4 times per day   pantoprazole (PROTONIX) IV  40 mg Intravenous QHS   Continuous Infusions:  feeding supplement (OSMOLITE 1.5 CAL) 1,000 mL (06/14/23 1334)   PRN Meds:.acetaminophen **OR** acetaminophen (TYLENOL) oral liquid 160 mg/5 mL **OR** acetaminophen, glycopyrrolate, labetalol, naphazoline-pheniramine, mouth rinse  Antimicrobials: Anti-infectives (From admission, onward)    Start     Dose/Rate Route Frequency Ordered Stop   06/11/23 1800  fluconazole (DIFLUCAN) tablet 100 mg  Status:  Discontinued        100 mg Per Tube Every evening 06/11/23 1526 06/13/23 1052   06/08/23 1600  fluconazole (DIFLUCAN) IVPB 100 mg  Status:  Discontinued        100 mg 50 mL/hr over 60 Minutes Intravenous Every 24 hours 06/07/23 1457 06/11/23 1526   06/07/23 1545  fluconazole (DIFLUCAN) IVPB 200 mg        200 mg 100 mL/hr over 60 Minutes Intravenous  Once 06/07/23 1457 06/07/23 1812   06/07/23 0600  ceFAZolin (ANCEF) IVPB 2g/100 mL premix        2 g 200 mL/hr over 30 Minutes Intravenous  Once 06/06/23 1423 06/07/23 0940   06/02/23 2200  metroNIDAZOLE (FLAGYL) IVPB 500 mg        500 mg 100 mL/hr over 60 Minutes Intravenous Every 12 hours 06/02/23 0908 06/02/23 2246   06/02/23 1400  ceFEPIme (MAXIPIME) 2 g in sodium chloride 0.9 % 100 mL IVPB        2 g 200 mL/hr over 30 Minutes Intravenous Every 8 hours 06/02/23 0908  06/02/23 1454   05/23/23 1130  metroNIDAZOLE (FLAGYL) IVPB 500 mg  Status:  Discontinued        500 mg 100 mL/hr over 60 Minutes Intravenous Every 12 hours 05/23/23 1033 06/02/23 0908   05/22/23 1330  ceFEPIme (MAXIPIME) 2 g in sodium chloride 0.9 % 100 mL IVPB  Status:  Discontinued        2 g 200 mL/hr over 30 Minutes Intravenous Every 8 hours 05/22/23 1257 06/02/23 0908   05/20/23 1415  amoxicillin-clavulanate (AUGMENTIN) 400-57 MG/5ML suspension 500 mg  Status:  Discontinued        500 mg Per Tube Every 8 hours 05/20/23 1323 05/22/23 1257   05/20/23 1000  amoxicillin-clavulanate (AUGMENTIN) 875-125 MG per tablet 1 tablet  Status:  Discontinued        1 tablet Oral Every 12 hours 05/20/23 0716 05/20/23 0723   05/20/23 0815  amoxicillin-clavulanate (AUGMENTIN) 250-62.5 MG/5ML suspension 500 mg  Status:  Discontinued        500 mg Per Tube Every 8 hours 05/20/23 0723 05/20/23 1323   05/17/23  1400  metroNIDAZOLE (FLAGYL) IVPB 500 mg  Status:  Discontinued        500 mg 100 mL/hr over 60 Minutes Intravenous Every 12 hours 05/17/23 1322 05/20/23 0716   05/17/23 1130  linezolid (ZYVOX) IVPB 600 mg  Status:  Discontinued        600 mg 300 mL/hr over 60 Minutes Intravenous Every 12 hours 05/17/23 1043 05/17/23 1322   05/16/23 1100  ceFEPIme (MAXIPIME) 2 g in sodium chloride 0.9 % 100 mL IVPB  Status:  Discontinued        2 g 200 mL/hr over 30 Minutes Intravenous Every 12 hours 05/16/23 1012 05/20/23 0716   05/15/23 1400  cefTRIAXone (ROCEPHIN) 2 g in sodium chloride 0.9 % 100 mL IVPB  Status:  Discontinued        2 g 200 mL/hr over 30 Minutes Intravenous Every 24 hours 05/15/23 1313 05/16/23 1012   05/15/23 1345  cefTRIAXone (ROCEPHIN) 1 g in sodium chloride 0.9 % 100 mL IVPB  Status:  Discontinued        1 g 200 mL/hr over 30 Minutes Intravenous Every 24 hours 05/15/23 1254 05/15/23 1313   05/11/23 2230  cefTRIAXone (ROCEPHIN) 1 g in sodium chloride 0.9 % 100 mL IVPB        1 g 200 mL/hr  over 30 Minutes Intravenous Daily at bedtime 05/11/23 2153 05/12/23 2209        I have personally reviewed the following labs and images: CBC: Recent Labs  Lab 06/10/23 0516 06/12/23 0650  WBC 6.4 6.8  HGB 11.4* 13.4  HCT 33.2* 38.3*  MCV 84.1 84.5  PLT 279 256   BMP &GFR Recent Labs  Lab 06/08/23 0822 06/10/23 0516 06/12/23 0650  NA 135 134* 138  K 4.3 3.6 4.3  CL 97* 100 99  CO2 29 28 27   GLUCOSE 126* 109* 105*  BUN 25* 23 16  CREATININE 0.80 0.62 0.59*  CALCIUM 9.3 8.7* 9.3  MG 2.2 2.2 2.3  PHOS 4.2 3.4 4.0   Estimated Creatinine Clearance: 58.1 mL/min (A) (by C-G formula based on SCr of 0.59 mg/dL (L)). Liver & Pancreas: Recent Labs  Lab 06/08/23 0822 06/10/23 0516 06/12/23 0650  AST 19 15 21   ALT 19 15 17   ALKPHOS 60 65 79  BILITOT 0.6 0.4 0.3  PROT 6.8 6.6 7.4  ALBUMIN 2.6* 2.6* 2.9*   No results for input(s): "LIPASE", "AMYLASE" in the last 168 hours. No results for input(s): "AMMONIA" in the last 168 hours. Diabetic: No results for input(s): "HGBA1C" in the last 72 hours. Recent Labs  Lab 06/13/23 2041 06/14/23 0006 06/14/23 0430 06/14/23 0747 06/14/23 1153  GLUCAP 123* 153* 125* 132* 125*   Cardiac Enzymes: No results for input(s): "CKTOTAL", "CKMB", "CKMBINDEX", "TROPONINI" in the last 168 hours. No results for input(s): "PROBNP" in the last 8760 hours. Coagulation Profile: No results for input(s): "INR", "PROTIME" in the last 168 hours. Thyroid Function Tests: No results for input(s): "TSH", "T4TOTAL", "FREET4", "T3FREE", "THYROIDAB" in the last 72 hours. Lipid Profile: No results for input(s): "CHOL", "HDL", "LDLCALC", "TRIG", "CHOLHDL", "LDLDIRECT" in the last 72 hours. Anemia Panel: No results for input(s): "VITAMINB12", "FOLATE", "FERRITIN", "TIBC", "IRON", "RETICCTPCT" in the last 72 hours. Urine analysis:    Component Value Date/Time   COLORURINE YELLOW 05/17/2023 1430   APPEARANCEUR CLEAR 05/17/2023 1430   APPEARANCEUR  Clear 07/23/2021 1535   LABSPEC 1.026 05/17/2023 1430   PHURINE 5.0 05/17/2023 1430   GLUCOSEU NEGATIVE 05/17/2023 1430  HGBUR NEGATIVE 05/17/2023 1430   BILIRUBINUR NEGATIVE 05/17/2023 1430   BILIRUBINUR 1+ 05/04/2023 1015   BILIRUBINUR Negative 07/23/2021 1535   KETONESUR NEGATIVE 05/17/2023 1430   PROTEINUR NEGATIVE 05/17/2023 1430   UROBILINOGEN 0.2 (A) 05/04/2023 1015   NITRITE NEGATIVE 05/17/2023 1430   LEUKOCYTESUR NEGATIVE 05/17/2023 1430   Sepsis Labs: Invalid input(s): "PROCALCITONIN", "LACTICIDVEN"  Microbiology: No results found for this or any previous visit (from the past 240 hours).  Radiology Studies: No results found.    Vittoria Noreen T. Irie Fiorello [facility]  If 7PM-7AM, please contact night-coverage [email or web address] 06/14/2023, 1:34 PM

## 2023-06-14 NOTE — Progress Notes (Signed)
 Occupational Therapy Treatment Patient Details Name: Derek Yates MRN: [id number] DOB: 1940-12-03 Today's Date: 06/14/2023   History of present illness 83 yo male presents to [facility] on 2/[facility] as code stroke for speech deficits, L weakness. 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 PEG tube placement on 06/07/2023. PMH includes dementia, recurrent UTIs, CVA, HTN.   OT comments  Pt is making continued progress towards acute OT goals. Pt continues to be limited by deficits below. Pt very lethargic upon arrival but presented with increased alertness once seated EOB. Pt resistant to rolling L/R requiring TOTAL A +2 for pericare after bowel accident. MAX A+2 was required to complete bed mobility task requiring verbal/tactile cues for sequencing and continuation. Good initiation of movement of RLE noted. Pt engaged in weight shifting anteriorly and laterally, and cervical rotation to R side to improve core strength, righting reaction and midline positioning. Pt completed STS using stedy with MAX A+2 to stand from bed. Pt tolerated standing ~1 minute, initially requiring MAX A but progressed to MOD-MIN A briefly to maintain balance. OT to continue following pt acutely to address functional needs with discharge recommendations of inpatient therapy <3hrs/day to maximize functional independence.      If plan is discharge home, recommend the following:  Two people to help with walking and/or transfers;Two people to help with bathing/dressing/bathroom;Direct supervision/assist for medications management;Direct supervision/assist for financial management;Assistance with cooking/housework;Assistance with feeding;Assist for transportation;Supervision due to cognitive status   Equipment Recommendations  Other (comment) (defer)    Recommendations for Other Services      Precautions / Restrictions Precautions Precautions: Fall Recall of Precautions/Restrictions:  Impaired Precaution/Restrictions Comments: PEG tube; abdominal binder, bilateral mitts Restrictions Weight Bearing Restrictions Per Provider Order: No       Mobility Bed Mobility Overal bed mobility: Needs Assistance Bed Mobility: Supine to Sit, Rolling, Sit to Supine Rolling: Total assist, +2 for physical assistance, Used rails   Supine to sit: Max assist, +2 for physical assistance, Used rails, HOB elevated Sit to supine: Max assist, +2 for safety/equipment, Used rails, HOB elevated   General bed mobility comments: Pt with good initiation moving BLE towards EOB with consistent verbal/tactile cues. Pt required assistance facilitating trunk to upright posture and scooting forward until B feet were flat on the floor. Pt with increased resistance when rolling L<>R in bed    Transfers Overall transfer level: Needs assistance Equipment used: Ambulation equipment used Transfers: Sit to/from Stand Sit to Stand: Via lift equipment, Max assist, +2 safety/equipment           General transfer comment: Pt required verbal/tactile cues for B hand placement. MAX A+2 to stand from bed. Pt initially required MAX A to maintain standing balance but progressed to MOD with brief periods of MIN A and verbal/tactile cues to maintain balance and upright standing posture. Transfer via Lift Equipment: Stedy   Balance Overall balance assessment: Needs assistance Sitting-balance support: Bilateral upper extremity supported, Feet supported Sitting balance-Leahy Scale: Fair Sitting balance - Comments: Pt observed leaning to L side and requiring MAX A to maintain balance while seated EOB. Pt progressed to supervision briefly Postural control: Posterior lean, Left lateral lean Standing balance support: Bilateral upper extremity supported, Reliant on assistive device for balance Standing balance-Leahy Scale: Poor Standing balance comment: Pt reliant on stedy to maintain support. Pt initially required MAX A to  maintain standing balance but progressed to MOD with brief periods of MIN A and verbal/tactile cues to maintain balance  and upright standing posture.                           ADL either performed or assessed with clinical judgement   ADL Overall ADL's : Needs assistance/impaired                             Toileting- Clothing Manipulation and Hygiene: Total assistance;Bed level Toileting - Clothing Manipulation Details (indicate cue type and reason): bowel accident in bed       General ADL Comments: session focuses on functional mobility    Extremity/Trunk Assessment Upper Extremity Assessment Upper Extremity Assessment: Generalized weakness RUE Deficits / Details: Able to hold UE against gravity >5 seconds, 0-140 active shoulder flexion in supine, decreased grip strength LUE Coordination: decreased fine motor;decreased gross motor   Lower Extremity Assessment Lower Extremity Assessment: Defer to PT evaluation        Vision   Vision Assessment?: Vision impaired- to be further tested in functional context Additional Comments: wears glasses, eyes did not cross midline, difficulty scanning to R side   Perception Perception Perception: Impaired Preception Impairment Details: Inattention/Neglect Perception-Other Comments: R visual inattention   Praxis Praxis Praxis: Not tested   Communication Communication Communication: Impaired Factors Affecting Communication: Reduced clarity of speech   Cognition Arousal: Alert Behavior During Therapy: WFL for tasks assessed/performed Cognition: History of cognitive impairments             OT - Cognition Comments: Pt lethargic at the beginning of session but level of alertness improved once seated EOB. Pt inconsistently followed one step verbal commands despite verbal/tactile cues                 Following commands: Impaired Following commands impaired: Follows one step commands inconsistently,  Follows one step commands with increased time      Cueing   Cueing Techniques: Verbal cues, Gestural cues, Tactile cues  Exercises      Shoulder Instructions       General Comments VSS    Pertinent Vitals/ Pain       Pain Assessment Faces Pain Scale: No hurt  Home Living                                          Prior Functioning/Environment              Frequency  Min 2X/week        Progress Toward Goals  OT Goals(current goals can now be found in the care plan section)  Progress towards OT goals: Progressing toward goals  Acute Rehab OT Goals OT Goal Formulation: Patient unable to participate in goal setting Time For Goal Achievement: 06/17/23 Potential to Achieve Goals: Fair ADL Goals Pt Will Perform Grooming: with min assist;sitting Pt Will Perform Upper Body Bathing: with mod assist;sitting Pt Will Perform Upper Body Dressing: with mod assist;sitting Pt Will Transfer to Toilet: with min assist;with +2 assist;bedside commode Pt/caregiver will Perform Home Exercise Program: Increased strength;Both right and left upper extremity;With minimal assist Additional ADL Goal #1: Patient will sit EOB with supervision for up to 10 min to increase independence with seated ADL and grooming.  Plan      Co-evaluation                 AM-PAC OT "  6 Clicks" Daily Activity     Outcome Measure   Help from another person eating meals?: Total Help from another person taking care of personal grooming?: Total Help from another person toileting, which includes using toliet, bedpan, or urinal?: Total Help from another person bathing (including washing, rinsing, drying)?: Total Help from another person to put on and taking off regular upper body clothing?: Total Help from another person to put on and taking off regular lower body clothing?: Total 6 Click Score: 6    End of Session Equipment Utilized During Treatment: Gait belt;Other (comment)  (stedy)  OT Visit Diagnosis: Unsteadiness on feet (R26.81);Other abnormalities of gait and mobility (R26.89);Muscle weakness (generalized) (M62.81);Other symptoms and signs involving cognitive function;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

## 2023-06-15 DIAGNOSIS — I629 Nontraumatic intracranial hemorrhage, unspecified: Secondary | ICD-10-CM | POA: Diagnosis not present

## 2023-06-15 DIAGNOSIS — I6389 Other cerebral infarction: Secondary | ICD-10-CM | POA: Diagnosis not present

## 2023-06-15 DIAGNOSIS — I610 Nontraumatic intracerebral hemorrhage in hemisphere, subcortical: Secondary | ICD-10-CM | POA: Diagnosis not present

## 2023-06-15 DIAGNOSIS — E43 Unspecified severe protein-calorie malnutrition: Secondary | ICD-10-CM | POA: Diagnosis not present

## 2023-06-15 LAB — GLUCOSE, CAPILLARY
Glucose-Capillary: 139 mg/dL — ABNORMAL HIGH (ref 70–99)
Glucose-Capillary: 127 mg/dL — ABNORMAL HIGH (ref 70–99)
Glucose-Capillary: 133 mg/dL — ABNORMAL HIGH (ref 70–99)
Glucose-Capillary: 146 mg/dL — ABNORMAL HIGH (ref 70–99)
Glucose-Capillary: 147 mg/dL — ABNORMAL HIGH (ref 70–99)

## 2023-06-15 MED ORDER — ORAL CARE MOUTH RINSE: 15.0000 mL | OROMUCOSAL | Status: DC | PRN

## 2023-06-15 MED ORDER — ORAL CARE MOUTH RINSE: 15.0000 mL | OROMUCOSAL | Status: DC

## 2023-06-15 NOTE — Progress Notes (Signed)
 PROGRESS NOTE  Derek BLESS ZOX:[id number] DOB: 12-24-1940   PCP: [name], MD  Patient is from: Home.  DOA: 05/11/2023 LOS: 35  Chief complaints No chief complaint on file.    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 He was admitted to neuro ICU, eventually stabilized and transferred to the hospitalist service. Hospital course complicated by aspiration pneumonia, failure to progress, persistent dysphagia and eventually had to have a PEG tube placed on 3/18.  He is also on dysphagia 1 diet.  Insurance denied LTACH even after appeal.  TOC and family working on SNF  Subjective: Seen and examined earlier this morning.  Awake and alert.  Has no complaints.  Has bilateral mittens.  Objective: Vitals:   06/15/23 0500 06/15/23 0513 06/15/23 0847 06/15/23 1140  BP:  (!) 114/56 124/70 124/64  Pulse:  99 93 93  Resp:  17 17 19   Temp:  98 F (36.7 C) 98.7 F (37.1 C) 98.7 F (37.1 C)  TempSrc:   Oral Oral  SpO2:  100% 97% 93%  Weight: 60 kg     Height:        Examination:  GENERAL: No apparent distress.  Nontoxic. HEENT: MMM.  Vision and hearing grossly intact.  NECK: Supple.  No apparent JVD.  RESP:  No IWOB.  Fair aeration bilaterally. CVS:  RRR. Heart sounds normal.  ABD/GI/GU: BS+. Abd soft, NTND.  G-tube in place. MSK/EXT:  Moves extremities. No apparent deformity. No edema.  Bilateral mittens. SKIN: no apparent skin lesion or wound NEURO: Awake and alert.  Oriented to self and place.  Follows commands. PSYCH: Calm. Normal affect.   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  Given underlying dementia palliative was consulted, GOC discussions were held and ongoing medical care has been established.  Due to persistent dysphagia, PEG tube placed by general surgery on 3/18.  Also upgraded to dysphagia 1 diet. -Continue tube feeds, now at goal.   -LTACH denied even after appeal.  Working on SNF placement.   Recurrent aspiration pneumonia: Completed antibiotic course for this.  He now has a PEG tube.  On room air. -Aspiration precaution   Acute respiratory failure with hypoxia due to aspiration pneumonia: Resolved.   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 Continue statins.  Severe dementia without behavioral disturbance/delirium: Awake and alert but only oriented to self and place.  Follows commands. -Reorientation and delirium precautions   Oral thrush -Received Diflucan from 3/16-3/24. -Continue nystatin suspension.   Acute kidney injury-Likely hemodynamic mediated.  Resolved.   Essential hypertension: Normotensive -Continue amlodipine   Hyperlipidemia  -Continue statins.   Dysphagia -TF per PEG tube -Dysphagia 1 diet   Severe malnutrition/dysphagia Body mass index is 20.11 kg/m. Nutrition Problem: Severe Malnutrition Etiology: chronic illness Signs/Symptoms: severe fat depletion, severe muscle depletion Interventions: Tube feeding, MVI, Prostat   DVT prophylaxis:  heparin injection 5,000 Units Start: 05/15/23 1400 SCD's Start: 05/11/23 2019  Code Status: DNR/DNI Family Communication: None at bedside today. Level of care:  Med-Surg Status is: Inpatient Remains inpatient appropriate because: Lack of safe disposition   Final disposition: SNF  35 minutes with more than 50% spent in  reviewing records, counseling patient/family and coordinating care.   Sch Meds:  Scheduled Meds:  amLODipine  10 mg Per Tube Daily   feeding supplement (PROSource TF20)  60 mL Per Tube Daily   fiber supplement (BANATROL TF)  60 mL Per Tube BID   free water  200 mL Per Tube Q4H   guaiFENesin  15 mL Per Tube Q6H   heparin injection (subcutaneous)  5,000 Units Subcutaneous Q12H   multivitamin with minerals  1 tablet Per Tube Daily   nystatin  5 mL Oral TID AC & HS   mouth rinse  15 mL Mouth Rinse 4 times per day   Continuous Infusions:  feeding supplement (OSMOLITE 1.5 CAL) 1,000 mL (06/15/23 1248)   PRN Meds:.acetaminophen **OR** acetaminophen (TYLENOL) oral liquid 160 mg/5 mL **OR** acetaminophen, glycopyrrolate, labetalol, naphazoline-pheniramine, mouth rinse, mouth rinse  Antimicrobials: Anti-infectives (From admission, onward)    Start     Dose/Rate Route Frequency Ordered Stop   06/11/23 1800  fluconazole (DIFLUCAN) tablet 100 mg  Status:  Discontinued        100 mg Per Tube Every evening 06/11/23 1526 06/13/23 1052   06/08/23 1600  fluconazole (DIFLUCAN) IVPB 100 mg  Status:  Discontinued        100 mg 50 mL/hr over 60 Minutes Intravenous Every 24 hours 06/07/23 1457 06/11/23 1526   06/07/23 1545  fluconazole (DIFLUCAN) IVPB 200 mg        200 mg 100 mL/hr over 60 Minutes Intravenous  Once 06/07/23 1457 06/07/23 1812   06/07/23 0600  ceFAZolin (ANCEF) IVPB 2g/100 mL premix        2 g 200 mL/hr over 30 Minutes Intravenous  Once 06/06/23 1423 06/07/23 0940   06/02/23 2200  metroNIDAZOLE (FLAGYL) IVPB 500 mg        500 mg 100 mL/hr over 60 Minutes Intravenous Every 12 hours 06/02/23 0908 06/02/23 2246   06/02/23 1400  ceFEPIme (MAXIPIME) 2 g in sodium chloride 0.9 % 100 mL IVPB        2 g 200 mL/hr over 30 Minutes Intravenous Every 8 hours 06/02/23 0908 06/02/23 1454   05/23/23 1130  metroNIDAZOLE (FLAGYL) IVPB 500 mg  Status:  Discontinued        500 mg 100 mL/hr over 60  Minutes Intravenous Every 12 hours 05/23/23 1033 06/02/23 0908   05/22/23 1330  ceFEPIme (MAXIPIME) 2 g in sodium chloride 0.9 % 100 mL IVPB  Status:  Discontinued        2 g 200 mL/hr over 30 Minutes Intravenous Every 8 hours 05/22/23 1257 06/02/23 0908   05/20/23 1415  amoxicillin-clavulanate (AUGMENTIN) 400-57 MG/5ML suspension 500 mg  Status:  Discontinued        500 mg Per Tube Every 8 hours 05/20/23 1323 05/22/23 1257   05/20/23 1000  amoxicillin-clavulanate (AUGMENTIN) 875-125 MG per tablet 1 tablet  Status:  Discontinued        1 tablet Oral Every 12 hours 05/20/23 0716 05/20/23 0723   05/20/23 0815  amoxicillin-clavulanate (AUGMENTIN) 250-62.5 MG/5ML suspension 500 mg  Status:  Discontinued        500 mg Per Tube Every 8 hours 05/20/23 0723 05/20/23 1323   05/17/23 1400  metroNIDAZOLE (FLAGYL) IVPB 500 mg  Status:  Discontinued        500 mg 100 mL/hr over  60 Minutes Intravenous Every 12 hours 05/17/23 1322 05/20/23 0716   05/17/23 1130  linezolid (ZYVOX) IVPB 600 mg  Status:  Discontinued        600 mg 300 mL/hr over 60 Minutes Intravenous Every 12 hours 05/17/23 1043 05/17/23 1322   05/16/23 1100  ceFEPIme (MAXIPIME) 2 g in sodium chloride 0.9 % 100 mL IVPB  Status:  Discontinued        2 g 200 mL/hr over 30 Minutes Intravenous Every 12 hours 05/16/23 1012 05/20/23 0716   05/15/23 1400  cefTRIAXone (ROCEPHIN) 2 g in sodium chloride 0.9 % 100 mL IVPB  Status:  Discontinued        2 g 200 mL/hr over 30 Minutes Intravenous Every 24 hours 05/15/23 1313 05/16/23 1012   05/15/23 1345  cefTRIAXone (ROCEPHIN) 1 g in sodium chloride 0.9 % 100 mL IVPB  Status:  Discontinued        1 g 200 mL/hr over 30 Minutes Intravenous Every 24 hours 05/15/23 1254 05/15/23 1313   05/11/23 2230  cefTRIAXone (ROCEPHIN) 1 g in sodium chloride 0.9 % 100 mL IVPB        1 g 200 mL/hr over 30 Minutes Intravenous Daily at bedtime 05/11/23 2153 05/12/23 2209        I have personally reviewed the following  labs and images: CBC: Recent Labs  Lab 06/10/23 0516 06/12/23 0650  WBC 6.4 6.8  HGB 11.4* 13.4  HCT 33.2* 38.3*  MCV 84.1 84.5  PLT 279 256   BMP &GFR Recent Labs  Lab 06/10/23 0516 06/12/23 0650  NA 134* 138  K 3.6 4.3  CL 100 99  CO2 28 27  GLUCOSE 109* 105*  BUN 23 16  CREATININE 0.62 0.59*  CALCIUM 8.7* 9.3  MG 2.2 2.3  PHOS 3.4 4.0   Estimated Creatinine Clearance: 57.3 mL/min (A) (by C-G formula based on SCr of 0.59 mg/dL (L)). Liver & Pancreas: Recent Labs  Lab 06/10/23 0516 06/12/23 0650  AST 15 21  ALT 15 17  ALKPHOS 65 79  BILITOT 0.4 0.3  PROT 6.6 7.4  ALBUMIN 2.6* 2.9*   No results for input(s): "LIPASE", "AMYLASE" in the last 168 hours. No results for input(s): "AMMONIA" in the last 168 hours. Diabetic: No results for input(s): "HGBA1C" in the last 72 hours. Recent Labs  Lab 06/14/23 1942 06/14/23 2327 06/15/23 0514 06/15/23 0848 06/15/23 1141  GLUCAP 127* 115* 139* 127* 133*   Cardiac Enzymes: No results for input(s): "CKTOTAL", "CKMB", "CKMBINDEX", "TROPONINI" in the last 168 hours. No results for input(s): "PROBNP" in the last 8760 hours. Coagulation Profile: No results for input(s): "INR", "PROTIME" in the last 168 hours. Thyroid Function Tests: No results for input(s): "TSH", "T4TOTAL", "FREET4", "T3FREE", "THYROIDAB" in the last 72 hours. Lipid Profile: No results for input(s): "CHOL", "HDL", "LDLCALC", "TRIG", "CHOLHDL", "LDLDIRECT" in the last 72 hours. Anemia Panel: No results for input(s): "VITAMINB12", "FOLATE", "FERRITIN", "TIBC", "IRON", "RETICCTPCT" in the last 72 hours. Urine analysis:    Component Value Date/Time   COLORURINE YELLOW 05/17/2023 1430   APPEARANCEUR CLEAR 05/17/2023 1430   APPEARANCEUR Clear 07/23/2021 1535   LABSPEC 1.026 05/17/2023 1430   PHURINE 5.0 05/17/2023 1430   GLUCOSEU NEGATIVE 05/17/2023 1430   HGBUR NEGATIVE 05/17/2023 1430   BILIRUBINUR NEGATIVE 05/17/2023 1430   BILIRUBINUR 1+  05/04/2023 1015   BILIRUBINUR Negative 07/23/2021 1535   KETONESUR NEGATIVE 05/17/2023 1430   PROTEINUR NEGATIVE 05/17/2023 1430   UROBILINOGEN 0.2 (A) 05/04/2023 1015  NITRITE NEGATIVE 05/17/2023 1430   LEUKOCYTESUR NEGATIVE 05/17/2023 1430   Sepsis Labs: Invalid input(s): "PROCALCITONIN", "LACTICIDVEN"  Microbiology: No results found for this or any previous visit (from the past 240 hours).  Radiology Studies: No results found.    Keairra Bardon T. Towana Stenglein [facility]  If 7PM-7AM, please contact night-coverage [email or web address] 06/15/2023, 2:03 PM

## 2023-06-15 NOTE — NC FL2 (Signed)
 [place] MEDICAID FL2 LEVEL OF CARE FORM     IDENTIFICATION  Patient Name: Derek Yates Birthdate: December 24, 1940 Sex: male Admission Date (Current Location): 05/11/2023  [facility] [place] and [place] Number:  Producer, television/film/video and Address:  [facility] [place], 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) 05/15/2023   Protein-calorie malnutrition, severe 05/13/2023   Stroke (cerebrum) (HCC) 05/11/2023   Acute cough 04/21/2023   Other abnormal findings in urine 04/21/2023   Mild dementia without behavioral disturbance, psychotic disturbance, mood disturbance, or anxiety (HCC) 02/14/2023   Onychomycosis 08/17/2022   Gait abnormality 08/17/2022   Mixed hyperlipidemia 06/01/2022   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 (HCC) 06/01/2022    Orientation RESPIRATION BLADDER Height & Weight     Self  Normal Incontinent Weight: 132 lb 4.4 oz (60 kg) Height:  5\' 8"  (172.7 cm)  BEHAVIORAL SYMPTOMS/MOOD NEUROLOGICAL BOWEL NUTRITION STATUS      Incontinent Diet, Feeding tube (Osmolite 1.5 @ 50 mL/hr; see DC summary for diet, also eating some POs)  AMBULATORY STATUS COMMUNICATION OF NEEDS Skin   Extensive Assist Verbally Surgical wounds (closed incision Abdomen)                       Personal Care Assistance Level of Assistance  Bathing, Feeding, Dressing Bathing Assistance: Maximum assistance Feeding assistance: Maximum assistance Dressing Assistance: Maximum assistance      Functional Limitations Info  Sight, Hearing, Speech Sight Info: Impaired Hearing Info: Adequate Speech Info: Adequate    SPECIAL CARE FACTORS FREQUENCY  PT (By licensed PT), OT (By licensed OT), Speech therapy     PT Frequency: 5x per week OT Frequency: 5x per week     Speech Therapy Frequency: 5x/wk      Contractures Contractures Info: Not present    Additional Factors Info  Code Status, Allergies Code Status Info: DNR Allergies Info: Beef-derived Drug products,Pork-derived Products           Current Medications (06/15/2023):  This is the current hospital active medication list Current Facility-Administered Medications  Medication Dose Route Frequency Provider Last Rate Last Admin   acetaminophen (TYLENOL) tablet 650 mg  650 mg Oral Q4H PRN Juliet Rude, PA-C   650 mg at 05/28/23 1443   Or   acetaminophen (TYLENOL) 160 MG/5ML solution 650 mg  650 mg Per Tube Q4H PRN Juliet Rude, PA-C   650 mg at 06/03/23 2159   Or   acetaminophen (TYLENOL) suppository 650 mg  650 mg Rectal Q4H PRN Juliet Rude, PA-C       amLODipine (NORVASC) tablet 10 mg  10 mg Per Tube Daily Trixie Deis R, PA-C   10 mg at 06/15/23 1001   feeding supplement (OSMOLITE 1.5 CAL) liquid 1,000 mL  1,000 mL Per Tube Continuous Leatha Gilding, MD 50 mL/hr at 06/14/23 1619 Infusion Verify at 06/14/23 1619   feeding supplement (PROSource TF20) liquid 60 mL  60 mL Per Tube Daily  Leatha Gilding, MD   60 mL at 06/15/23 1000   fiber supplement (BANATROL TF) liquid 60 mL  60 mL Per Tube BID Juliet Rude, PA-C   60 mL at 06/15/23 1000   free water 200 mL  200 mL Per Tube Q4H Trixie Deis R, PA-C   200 mL at 06/15/23 0800   glycopyrrolate (ROBINUL) tablet 1 mg  1 mg Per Tube BID PRN Juliet Rude, PA-C   1 mg at 06/03/23 6045   guaiFENesin (ROBITUSSIN) 100 MG/5ML liquid 15 mL  15 mL Per Tube Q6H Trixie Deis R, PA-C   15 mL at 06/15/23 0542   heparin injection 5,000 Units  5,000 Units  Subcutaneous Q12H Juliet Rude, PA-C   5,000 Units at 06/15/23 1000   labetalol (NORMODYNE) injection 10-20 mg  10-20 mg Intravenous Q2H PRN Juliet Rude, PA-C   10 mg at 05/16/23 0401   multivitamin with minerals tablet 1 tablet  1 tablet Per Tube Daily Juliet Rude, PA-C   1 tablet at 06/15/23 1000   naphazoline-pheniramine (NAPHCON-A) 0.025-0.3 % ophthalmic solution 1 drop  1 drop Both Eyes QID PRN Marinda Elk, MD       nystatin (MYCOSTATIN) 100000 UNIT/ML suspension 500,000 Units  5 mL Oral TID AC & HS Leatha Gilding, MD   500,000 Units at 06/15/23 1000   Oral care mouth rinse  15 mL Mouth Rinse PRN Juliet Rude, PA-C       Oral care mouth rinse  15 mL Mouth Rinse 4 times per day Juliet Rude, PA-C   15 mL at 06/15/23 4098   Oral care mouth rinse  15 mL Mouth Rinse PRN Almon Hercules, MD         Discharge Medications: Please see discharge summary for a list of discharge medications.  Relevant Imaging Results:  Relevant Lab Results:   Additional Information SSN [id number]  [name], [place]

## 2023-06-15 NOTE — Plan of Care (Signed)
  Problem: Education: Goal: Knowledge of General Education information will improve Description: Including pain rating scale, medication(s)/side effects and non-pharmacologic comfort measures Outcome: Progressing   Problem: Health Behavior/Discharge Planning: Goal: Ability to manage health-related needs will improve Outcome: Progressing   Problem: Clinical Measurements: Goal: Ability to maintain clinical measurements within normal limits will improve Outcome: Progressing Goal: Will remain free from infection Outcome: Progressing Goal: Diagnostic test results will improve Outcome: Progressing Goal: Respiratory complications will improve Outcome: Progressing Goal: Cardiovascular complication will be avoided Outcome: Progressing   Problem: Activity: Goal: Risk for activity intolerance will decrease Outcome: Progressing   Problem: Nutrition: Goal: Adequate nutrition will be maintained Outcome: Progressing   Problem: Coping: Goal: Level of anxiety will decrease Outcome: Progressing   Problem: Elimination: Goal: Will not experience complications related to bowel motility Outcome: Progressing Goal: Will not experience complications related to urinary retention Outcome: Progressing   Problem: Pain Managment: Goal: General experience of comfort will improve and/or be controlled Outcome: Progressing   Problem: Safety: Goal: Ability to remain free from injury will improve Outcome: Progressing   Problem: Skin Integrity: Goal: Risk for impaired skin integrity will decrease Outcome: Progressing   Problem: Education: Goal: Knowledge of disease or condition will improve Outcome: Progressing Goal: Knowledge of secondary prevention will improve (MUST DOCUMENT ALL) Outcome: Progressing Goal: Knowledge of patient specific risk factors will improve (DELETE if not current risk factor) Outcome: Progressing   Problem: Intracerebral Hemorrhage Tissue Perfusion: Goal: Complications  of Intracerebral Hemorrhage will be minimized Outcome: Progressing   Problem: Coping: Goal: Will verbalize positive feelings about self Outcome: Progressing Goal: Will identify appropriate support needs Outcome: Progressing   Problem: Health Behavior/Discharge Planning: Goal: Ability to manage health-related needs will improve Outcome: Progressing Goal: Goals will be collaboratively established with patient/family Outcome: Progressing   Problem: Self-Care: Goal: Ability to participate in self-care as condition permits will improve Outcome: Progressing Goal: Verbalization of feelings and concerns over difficulty with self-care will improve Outcome: Progressing Goal: Ability to communicate needs accurately will improve Outcome: Progressing   Problem: Nutrition: Goal: Risk of aspiration will decrease Outcome: Progressing Goal: Dietary intake will improve Outcome: Progressing   Problem: Education: Goal: Ability to describe self-care measures that may prevent or decrease complications (Diabetes Survival Skills Education) will improve Outcome: Progressing   Problem: Coping: Goal: Ability to adjust to condition or change in health will improve Outcome: Progressing   Problem: Fluid Volume: Goal: Ability to maintain a balanced intake and output will improve Outcome: Progressing   Problem: Health Behavior/Discharge Planning: Goal: Ability to identify and utilize available resources and services will improve Outcome: Progressing Goal: Ability to manage health-related needs will improve Outcome: Progressing   Problem: Metabolic: Goal: Ability to maintain appropriate glucose levels will improve Outcome: Progressing   Problem: Nutritional: Goal: Maintenance of adequate nutrition will improve Outcome: Progressing Goal: Progress toward achieving an optimal weight will improve Outcome: Progressing   Problem: Skin Integrity: Goal: Risk for impaired skin integrity will  decrease Outcome: Progressing   Problem: Tissue Perfusion: Goal: Adequacy of tissue perfusion will improve Outcome: Progressing

## 2023-06-15 NOTE — Progress Notes (Signed)
 Speech Language Pathology Treatment: Dysphagia  Patient Details Name: Derek Yates MRN: [id number] DOB: 1940-11-03 Today's Date: 06/15/2023 Time: 4098-1191 SLP Time Calculation (min) (ACUTE ONLY): 22 min  Assessment / Plan / Recommendation Clinical Impression  Derek Yates continues to make gains toward his swallowing goals.  Today he was alert, joking with staff, smiling. Derek Yates was at the bedside.  Underwent trials of mechanical solids - pt demonstrated active and effective chewing; there was no oral holding. No oral residue after the swallow.  Recommend that diet be advanced to regular solids, thin liquids, to allow for more selection.  Pt and wife very pleased with plan.  SLP will follow. Notified RN and RD. Updated sign posted at [facility].   HPI HPI: Patient is an 83 y.o. male with PMH: dementia, recurrent UTI's, CVA, HTN. He presented to the hospital on 05/11/23 as a code stroke with speech deficits and left sided weakness. 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 Patient has been NPO; Cortrak placed 2/21. MBS 3/10 - recs for NPO due primarily to AMS.  Pt was followed by palliative care; decision had been made to shift to comfort care, then reversed by family and SLP was re-reconsulted to repeat swallow assessment on 3/13. PEG 3/18.      SLP Plan  Continue with current plan of care      Recommendations for follow up therapy are one component of a multi-disciplinary discharge planning process, led by the attending physician.  Recommendations may be updated based on patient status, additional functional criteria and insurance authorization.    Recommendations  Diet recommendations: Regular;Thin liquid Liquids provided via: Cup;Teaspoon Medication Administration: Via alternative means Supervision: Staff to assist with self feeding Postural Changes and/or Swallow  Maneuvers: Seated upright 90 degrees                  Oral care BID   Frequent or constant Supervision/Assistance Dysphagia, oropharyngeal phase (R13.12)     Continue with current plan of care   [place] Wherry L. Samson Frederic, MA CCC/SLP Clinical Specialist - Acute Care SLP Acute Rehabilitation Services Office number [phone]   Blenda Mounts Laurice  06/15/2023, 2:54 PM

## 2023-06-15 NOTE — TOC Progression Note (Signed)
 Transition of Care [facility]) - Progression Note    Patient Details  Name: Derek Yates MRN: [id number] Date of Birth: 03/08/1941  [facility] CM/SW Contact  [name], [place] Phone Number: 06/15/2023, 2:43 PM  Clinical Narrative:   CSW updated referral information and spoke with Admissions at [facility]. Referral information sent for review, awaiting response. CSW provided update to son. CSW to follow.    Expected Discharge Plan: Skilled Nursing Facility Barriers to Discharge: Continued Medical Work up, SNF Pending bed offer, English as a second language teacher  Expected Discharge Plan and Services   Discharge Planning Services: CM Consult Post Acute Care Choice: Hospice Living arrangements for the past 2 months: Single Family Home                             [facility] Agency: [facility] Date [facility] Agency Contacted: 05/31/23   Representative spoke with at [facility] Agency: Efraim Kaufmann   Social Determinants of Health (SDOH) Interventions SDOH Screenings   Food Insecurity: Patient Unable To Answer (05/13/2023)  Housing: Patient Unable To Answer (05/13/2023)  Transportation Needs: Patient Unable To Answer (05/13/2023)  Utilities: Patient Unable To Answer (05/13/2023)  Depression (PHQ2-9): Medium Risk (02/10/2023)  Financial Resource Strain: Low Risk  (06/20/2021)   Received from [facility], [facility]  Social Connections: Patient Unable To Answer (05/13/2023)  Tobacco Use: Low Risk  (06/07/2023)    Readmission Risk Interventions     No data to display

## 2023-06-16 DIAGNOSIS — E43 Unspecified severe protein-calorie malnutrition: Secondary | ICD-10-CM | POA: Diagnosis not present

## 2023-06-16 DIAGNOSIS — I610 Nontraumatic intracerebral hemorrhage in hemisphere, subcortical: Secondary | ICD-10-CM | POA: Diagnosis not present

## 2023-06-16 DIAGNOSIS — I6389 Other cerebral infarction: Secondary | ICD-10-CM | POA: Diagnosis not present

## 2023-06-16 DIAGNOSIS — I629 Nontraumatic intracranial hemorrhage, unspecified: Secondary | ICD-10-CM | POA: Diagnosis not present

## 2023-06-16 LAB — GLUCOSE, CAPILLARY
Glucose-Capillary: 117 mg/dL — ABNORMAL HIGH (ref 70–99)
Glucose-Capillary: 118 mg/dL — ABNORMAL HIGH (ref 70–99)
Glucose-Capillary: 125 mg/dL — ABNORMAL HIGH (ref 70–99)
Glucose-Capillary: 127 mg/dL — ABNORMAL HIGH (ref 70–99)
Glucose-Capillary: 129 mg/dL — ABNORMAL HIGH (ref 70–99)
Glucose-Capillary: 144 mg/dL — ABNORMAL HIGH (ref 70–99)

## 2023-06-16 NOTE — Plan of Care (Signed)
   Problem: Activity: Goal: Risk for activity intolerance will decrease Outcome: Progressing   Problem: Nutrition: Goal: Adequate nutrition will be maintained Outcome: Progressing   Problem: Coping: Goal: Level of anxiety will decrease Outcome: Progressing

## 2023-06-16 NOTE — Progress Notes (Signed)
 PROGRESS NOTE  Derek Yates FAO:[id number] DOB: Mar 06, 1941   PCP: [name], MD  Patient is from: Home.  DOA: 05/11/2023 LOS: 36  Chief complaints No chief complaint on file.    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 He was admitted to neuro ICU, eventually stabilized and transferred to the hospitalist service. Hospital course complicated by aspiration pneumonia, failure to progress, persistent dysphagia and eventually had to have a PEG tube placed on 3/18.  He was on tube feed.  Upgraded to regular diet on 3/26.  P.o. intake improved.  Insurance denied LTACH even after appeal.  TOC and family working on SNF  Subjective: Seen and examined earlier this morning.  Awake and alert.  Has no complaints but not a great historian.  [name] wife at bedside.  She does state that he ate some [place] toast and eggs this morning.  He was upgraded to regular diet yesterday.  Objective: Vitals:   06/16/23 0002 06/16/23 0406 06/16/23 0746 06/16/23 1157  BP: 119/69 136/83 (!) 143/67 115/74  Pulse: 99 94 98 97  Resp:  18 17 17   Temp: 98.7 F (37.1 C) 98.4 F (36.9 C) 98.3 F (36.8 C) 98.9 F (37.2 C)  TempSrc: Axillary Axillary Oral Oral  SpO2: 94% 97% 97% 94%  Weight:      Height:        Examination:  GENERAL: No apparent distress.  Nontoxic. HEENT: MMM.  Vision and hearing grossly intact.  NECK: Supple.  No apparent JVD.  RESP:  No IWOB.  Fair aeration bilaterally. CVS:  RRR. Heart sounds normal.  ABD/GI/GU: BS+. Abd soft, NTND.  G-tube in place. MSK/EXT:  Moves extremities. No apparent deformity. No edema.  SKIN: no apparent skin lesion or wound NEURO: Awake and alert.  Oriented to self place and family.  Follows commands. PSYCH: Calm. Normal affect.    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  Given underlying dementia palliative was consulted, GOC discussions were held and ongoing medical care has been established.  Due to persistent dysphagia, PEG tube placed by general surgery on 3/18, and was on tube feed.  Created to regular diet on 3/26 -Continue tube feed. -Calorie count underway. -LTACH denied even after appeal.  Working on SNF placement.   Recurrent aspiration pneumonia: Completed antibiotic course for this.  He now has a PEG tube.  On room air. -Aspiration precaution   Acute respiratory failure with hypoxia due to aspiration pneumonia: Resolved.   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 Continue statins.  Severe dementia without behavioral disturbance/delirium: Awake and alert but only oriented to self and place.  Follows commands. -Reorientation and delirium precautions  Dysphagia: PEG tube placed on 3/18 by surgery and was on tube feed. Upgraded to regular diet on 3/26. -Continue tube feed until calorie count is finalized   Oral thrush -Received Diflucan from 3/16-3/24. -Continue nystatin suspension.   Acute kidney injury-Likely hemodynamic mediated.  Resolved.   Essential hypertension: Normotensive -Continue amlodipine   Hyperlipidemia  -Continue  statins.    Severe malnutrition/dysphagia Body mass index is 20.11 kg/m. Nutrition Problem: Severe Malnutrition Etiology: chronic illness Signs/Symptoms: severe fat depletion, severe muscle depletion Interventions: Tube feeding, MVI, Prostat   DVT  prophylaxis:  heparin injection 5,000 Units Start: 05/15/23 1400 SCD's Start: 05/11/23 2019  Code Status: DNR/DNI Family Communication: Updated patient's wife at bedside. Level of care: Med-Surg Status is: Inpatient Remains inpatient appropriate because: Lack of safe disposition   Final disposition: SNF  35 minutes with more than 50% spent in reviewing records, counseling patient/family and coordinating care.   Sch Meds:  Scheduled Meds:  amLODipine  10 mg Per Tube Daily   feeding supplement (PROSource TF20)  60 mL Per Tube Daily   fiber supplement (BANATROL TF)  60 mL Per Tube BID   free water  200 mL Per Tube Q4H   guaiFENesin  15 mL Per Tube Q6H   heparin injection (subcutaneous)  5,000 Units Subcutaneous Q12H   multivitamin with minerals  1 tablet Per Tube Daily   nystatin  5 mL Oral TID AC & HS   mouth rinse  15 mL Mouth Rinse 4 times per day   Continuous Infusions:  feeding supplement (OSMOLITE 1.5 CAL) 1,000 mL (06/16/23 1210)   PRN Meds:.acetaminophen **OR** acetaminophen (TYLENOL) oral liquid 160 mg/5 mL **OR** acetaminophen, glycopyrrolate, labetalol, naphazoline-pheniramine, mouth rinse, mouth rinse  Antimicrobials: Anti-infectives (From admission, onward)    Start     Dose/Rate Route Frequency Ordered Stop   06/11/23 1800  fluconazole (DIFLUCAN) tablet 100 mg  Status:  Discontinued        100 mg Per Tube Every evening 06/11/23 1526 06/13/23 1052   06/08/23 1600  fluconazole (DIFLUCAN) IVPB 100 mg  Status:  Discontinued        100 mg 50 mL/hr over 60 Minutes Intravenous Every 24 hours 06/07/23 1457 06/11/23 1526   06/07/23 1545  fluconazole (DIFLUCAN) IVPB 200 mg        200 mg 100 mL/hr over 60 Minutes Intravenous  Once 06/07/23 1457 06/07/23 1812   06/07/23 0600  ceFAZolin (ANCEF) IVPB 2g/100 mL premix        2 g 200 mL/hr over 30 Minutes Intravenous  Once 06/06/23 1423 06/07/23 0940   06/02/23 2200  metroNIDAZOLE (FLAGYL) IVPB 500 mg        500 mg 100  mL/hr over 60 Minutes Intravenous Every 12 hours 06/02/23 0908 06/02/23 2246   06/02/23 1400  ceFEPIme (MAXIPIME) 2 g in sodium chloride 0.9 % 100 mL IVPB        2 g 200 mL/hr over 30 Minutes Intravenous Every 8 hours 06/02/23 0908 06/02/23 1454   05/23/23 1130  metroNIDAZOLE (FLAGYL) IVPB 500 mg  Status:  Discontinued        500 mg 100 mL/hr over 60 Minutes Intravenous Every 12 hours 05/23/23 1033 06/02/23 0908   05/22/23 1330  ceFEPIme (MAXIPIME) 2 g in sodium chloride 0.9 % 100 mL IVPB  Status:  Discontinued        2 g 200 mL/hr over 30 Minutes Intravenous Every 8 hours 05/22/23 1257 06/02/23 0908   05/20/23 1415  amoxicillin-clavulanate (AUGMENTIN) 400-57 MG/5ML suspension 500 mg  Status:  Discontinued        500 mg Per Tube Every 8 hours 05/20/23 1323 05/22/23 1257   05/20/23 1000  amoxicillin-clavulanate (AUGMENTIN) 875-125 MG per tablet 1 tablet  Status:  Discontinued        1 tablet Oral Every 12 hours 05/20/23 0716 05/20/23  4696   05/20/23 0815  amoxicillin-clavulanate (AUGMENTIN) 250-62.5 MG/5ML suspension 500 mg  Status:  Discontinued        500 mg Per Tube Every 8 hours 05/20/23 0723 05/20/23 1323   05/17/23 1400  metroNIDAZOLE (FLAGYL) IVPB 500 mg  Status:  Discontinued        500 mg 100 mL/hr over 60 Minutes Intravenous Every 12 hours 05/17/23 1322 05/20/23 0716   05/17/23 1130  linezolid (ZYVOX) IVPB 600 mg  Status:  Discontinued        600 mg 300 mL/hr over 60 Minutes Intravenous Every 12 hours 05/17/23 1043 05/17/23 1322   05/16/23 1100  ceFEPIme (MAXIPIME) 2 g in sodium chloride 0.9 % 100 mL IVPB  Status:  Discontinued        2 g 200 mL/hr over 30 Minutes Intravenous Every 12 hours 05/16/23 1012 05/20/23 0716   05/15/23 1400  cefTRIAXone (ROCEPHIN) 2 g in sodium chloride 0.9 % 100 mL IVPB  Status:  Discontinued        2 g 200 mL/hr over 30 Minutes Intravenous Every 24 hours 05/15/23 1313 05/16/23 1012   05/15/23 1345  cefTRIAXone (ROCEPHIN) 1 g in sodium chloride 0.9 %  100 mL IVPB  Status:  Discontinued        1 g 200 mL/hr over 30 Minutes Intravenous Every 24 hours 05/15/23 1254 05/15/23 1313   05/11/23 2230  cefTRIAXone (ROCEPHIN) 1 g in sodium chloride 0.9 % 100 mL IVPB        1 g 200 mL/hr over 30 Minutes Intravenous Daily at bedtime 05/11/23 2153 05/12/23 2209        I have personally reviewed the following labs and images: CBC: Recent Labs  Lab 06/10/23 0516 06/12/23 0650  WBC 6.4 6.8  HGB 11.4* 13.4  HCT 33.2* 38.3*  MCV 84.1 84.5  PLT 279 256   BMP &GFR Recent Labs  Lab 06/10/23 0516 06/12/23 0650  NA 134* 138  K 3.6 4.3  CL 100 99  CO2 28 27  GLUCOSE 109* 105*  BUN 23 16  CREATININE 0.62 0.59*  CALCIUM 8.7* 9.3  MG 2.2 2.3  PHOS 3.4 4.0   Estimated Creatinine Clearance: 57.3 mL/min (A) (by C-G formula based on SCr of 0.59 mg/dL (L)). Liver & Pancreas: Recent Labs  Lab 06/10/23 0516 06/12/23 0650  AST 15 21  ALT 15 17  ALKPHOS 65 79  BILITOT 0.4 0.3  PROT 6.6 7.4  ALBUMIN 2.6* 2.9*   No results for input(s): "LIPASE", "AMYLASE" in the last 168 hours. No results for input(s): "AMMONIA" in the last 168 hours. Diabetic: No results for input(s): "HGBA1C" in the last 72 hours. Recent Labs  Lab 06/16/23 0006 06/16/23 0411 06/16/23 0749 06/16/23 1158 06/16/23 1523  GLUCAP 125* 117* 144* 127* 118*   Cardiac Enzymes: No results for input(s): "CKTOTAL", "CKMB", "CKMBINDEX", "TROPONINI" in the last 168 hours. No results for input(s): "PROBNP" in the last 8760 hours. Coagulation Profile: No results for input(s): "INR", "PROTIME" in the last 168 hours. Thyroid Function Tests: No results for input(s): "TSH", "T4TOTAL", "FREET4", "T3FREE", "THYROIDAB" in the last 72 hours. Lipid Profile: No results for input(s): "CHOL", "HDL", "LDLCALC", "TRIG", "CHOLHDL", "LDLDIRECT" in the last 72 hours. Anemia Panel: No results for input(s): "VITAMINB12", "FOLATE", "FERRITIN", "TIBC", "IRON", "RETICCTPCT" in the last 72  hours. Urine analysis:    Component Value Date/Time   COLORURINE YELLOW 05/17/2023 1430   APPEARANCEUR CLEAR 05/17/2023 1430   APPEARANCEUR Clear 07/23/2021 1535  LABSPEC 1.026 05/17/2023 1430   PHURINE 5.0 05/17/2023 1430   GLUCOSEU NEGATIVE 05/17/2023 1430   HGBUR NEGATIVE 05/17/2023 1430   BILIRUBINUR NEGATIVE 05/17/2023 1430   BILIRUBINUR 1+ 05/04/2023 1015   BILIRUBINUR Negative 07/23/2021 1535   KETONESUR NEGATIVE 05/17/2023 1430   PROTEINUR NEGATIVE 05/17/2023 1430   UROBILINOGEN 0.2 (A) 05/04/2023 1015   NITRITE NEGATIVE 05/17/2023 1430   LEUKOCYTESUR NEGATIVE 05/17/2023 1430   Sepsis Labs: Invalid input(s): "PROCALCITONIN", "LACTICIDVEN"  Microbiology: No results found for this or any previous visit (from the past 240 hours).  Radiology Studies: No results found.    Monigue Spraggins T. Chrissie Dacquisto [facility]  If 7PM-7AM, please contact night-coverage [email or web address] 06/16/2023, 3:28 PM

## 2023-06-16 NOTE — TOC Progression Note (Signed)
 Transition of Care [facility]) - Progression Note    Patient Details  Name: NYMIR RINGLER MRN: [id number] Date of Birth: 22-Oct-1940  Transition of Care [facility]) CM/SW Contact  [name], [place] Phone Number: 06/16/2023, 4:06 PM  Clinical Narrative:   CSW unable to reach anyone at [facility] to discuss referral, Admissions is out of office today. CSW provided update to son, Beckey Downing, he said he would reach out to his contact over there. CSW to follow.    Expected Discharge Plan: Skilled Nursing Facility Barriers to Discharge: Continued Medical Work up, SNF Pending bed offer, English as a second language teacher  Expected Discharge Plan and Services   Discharge Planning Services: CM Consult Post Acute Care Choice: Hospice Living arrangements for the past 2 months: Single Family Home                             [facility] Agency: [facility] Date [facility] Agency Contacted: 05/31/23   Representative spoke with at [facility] Agency: Efraim Kaufmann   Social Determinants of Health (SDOH) Interventions SDOH Screenings   Food Insecurity: Patient Unable To Answer (05/13/2023)  Housing: Patient Unable To Answer (05/13/2023)  Transportation Needs: Patient Unable To Answer (05/13/2023)  Utilities: Patient Unable To Answer (05/13/2023)  Depression (PHQ2-9): Medium Risk (02/10/2023)  Financial Resource Strain: Low Risk  (06/20/2021)   Received from [facility], [facility]  Social Connections: Patient Unable To Answer (05/13/2023)  Tobacco Use: Low Risk  (06/07/2023)    Readmission Risk Interventions     No data to display

## 2023-06-16 NOTE — Plan of Care (Signed)
  Problem: Education: Goal: Knowledge of General Education information will improve Description: Including pain rating scale, medication(s)/side effects and non-pharmacologic comfort measures Outcome: Progressing   Problem: Health Behavior/Discharge Planning: Goal: Ability to manage health-related needs will improve Outcome: Progressing   Problem: Clinical Measurements: Goal: Ability to maintain clinical measurements within normal limits will improve Outcome: Progressing Goal: Will remain free from infection Outcome: Progressing Goal: Diagnostic test results will improve Outcome: Progressing Goal: Respiratory complications will improve Outcome: Progressing Goal: Cardiovascular complication will be avoided Outcome: Progressing   Problem: Activity: Goal: Risk for activity intolerance will decrease Outcome: Progressing   Problem: Nutrition: Goal: Adequate nutrition will be maintained Outcome: Progressing   Problem: Coping: Goal: Level of anxiety will decrease Outcome: Progressing   Problem: Elimination: Goal: Will not experience complications related to bowel motility Outcome: Progressing Goal: Will not experience complications related to urinary retention Outcome: Progressing   Problem: Pain Managment: Goal: General experience of comfort will improve and/or be controlled Outcome: Progressing   Problem: Safety: Goal: Ability to remain free from injury will improve Outcome: Progressing   Problem: Skin Integrity: Goal: Risk for impaired skin integrity will decrease Outcome: Progressing   Problem: Education: Goal: Knowledge of disease or condition will improve Outcome: Progressing Goal: Knowledge of secondary prevention will improve (MUST DOCUMENT ALL) Outcome: Progressing Goal: Knowledge of patient specific risk factors will improve (DELETE if not current risk factor) Outcome: Progressing   Problem: Intracerebral Hemorrhage Tissue Perfusion: Goal: Complications  of Intracerebral Hemorrhage will be minimized Outcome: Progressing   Problem: Coping: Goal: Will verbalize positive feelings about self Outcome: Progressing Goal: Will identify appropriate support needs Outcome: Progressing   Problem: Health Behavior/Discharge Planning: Goal: Ability to manage health-related needs will improve Outcome: Progressing Goal: Goals will be collaboratively established with patient/family Outcome: Progressing   Problem: Self-Care: Goal: Ability to participate in self-care as condition permits will improve Outcome: Progressing Goal: Verbalization of feelings and concerns over difficulty with self-care will improve Outcome: Progressing Goal: Ability to communicate needs accurately will improve Outcome: Progressing   Problem: Nutrition: Goal: Risk of aspiration will decrease Outcome: Progressing Goal: Dietary intake will improve Outcome: Progressing   Problem: Education: Goal: Ability to describe self-care measures that may prevent or decrease complications (Diabetes Survival Skills Education) will improve Outcome: Progressing   Problem: Coping: Goal: Ability to adjust to condition or change in health will improve Outcome: Progressing   Problem: Fluid Volume: Goal: Ability to maintain a balanced intake and output will improve Outcome: Progressing   Problem: Health Behavior/Discharge Planning: Goal: Ability to identify and utilize available resources and services will improve Outcome: Progressing Goal: Ability to manage health-related needs will improve Outcome: Progressing   Problem: Metabolic: Goal: Ability to maintain appropriate glucose levels will improve Outcome: Progressing   Problem: Nutritional: Goal: Maintenance of adequate nutrition will improve Outcome: Progressing Goal: Progress toward achieving an optimal weight will improve Outcome: Progressing   Problem: Skin Integrity: Goal: Risk for impaired skin integrity will  decrease Outcome: Progressing   Problem: Tissue Perfusion: Goal: Adequacy of tissue perfusion will improve Outcome: Progressing

## 2023-06-16 NOTE — Progress Notes (Signed)
 Nutrition Brief Note  Pt advanced to regular diet, thin liquids, per speech yesterday. [name] intake and alertness have greatly improved.   Average Meal Intake 3/25: 25% x3 documented meals 3/26: 100% x1 documented meal 3/27: 90% x1 documented meal  Given significantly increased intake, will recommend calorie count to assess intake and modify TF regimen, as indicated. Pt continues to be severely malnourished.   Spoke with pt son, Beckey Downing, via phone and discussed current plan. If meeting majority of needs with diet, preference would be to bolus nutrition 1-2x per day to insight weight gain and functional progress. Beckey Downing states that patient's wife (his mother) is also bringing in food she is making at home to stimulate appetite/intake.   Spoke with RN, via secure chat, around initiation of calorie count and attempting to capture any outside food that is brought in as a contributor to overall calorie/protein intake.   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 - remains applicable   GOAL:  Patient will meet greater than or equal to 90% of their needs - being met with TFs running at goal rate   MONITOR:  Diet advancement, TF tolerance, Labs    Myrtie Cruise MS, RD, LDN Registered Dietitian Clinical Nutrition RD Inpatient Contact Info in Amion

## 2023-06-16 NOTE — Progress Notes (Signed)
 Physical Therapy Treatment Patient Details Name: EDMON MAGID MRN: [id number] DOB: 1940/05/19 Today's Date: 06/16/2023   History of Present Illness 83 yo male presents to [facility] on 2/19 as code stroke for speech deficits, L weakness. 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 PEG tube placement on 06/07/2023. PMH includes dementia, recurrent UTIs, CVA, HTN.    PT Comments  Pt with similar presentation to previous sessions. Continues to require +2 Max/total A for all mobility. No change in DC/DME recs at this time. PT will continue to follow.     If plan is discharge home, recommend the following: Two people to help with walking and/or transfers;Two people to help with bathing/dressing/bathroom;A lot of help with walking and/or transfers;Assistance with cooking/housework;Assistance with feeding;Assist for transportation;Help with stairs or ramp for entrance   Can travel by private vehicle     No  Equipment Recommendations  Wheelchair (measurements PT);Wheelchair cushion (measurements PT);Hospital bed;Hoyer lift    Recommendations for Other Services       Precautions / Restrictions Precautions Precautions: Fall Recall of Precautions/Restrictions: Impaired Precaution/Restrictions Comments: PEG tube; abdominal binder, Restrictions Weight Bearing Restrictions Per Provider Order: No     Mobility  Bed Mobility Overal bed mobility: Needs Assistance Bed Mobility: Supine to Sit, Rolling, Sit to Supine Rolling: Total assist, +2 for physical assistance, Used rails   Supine to sit: Max assist, +2 for physical assistance, Used rails, HOB elevated Sit to supine: Max assist, +2 for safety/equipment, Used rails, HOB elevated   General bed mobility comments: Pt with good initiation moving BLE towards EOB with consistent verbal/tactile cues. Pt required assistance facilitating trunk to upright posture and scooting forward until B feet were flat on the floor. Pt with  increased resistance when rolling L<>R in bed    Transfers Overall transfer level: Needs assistance Equipment used: Ambulation equipment used Transfers: Sit to/from Stand Sit to Stand: Via lift equipment, +2 physical assistance, Total assist           General transfer comment: Attempted 3 sit to stand today however pt actively resisting sit to  stands requiring total A to clear hips a few inches.    Ambulation/Gait               General Gait Details: unable at this time   Stairs             Wheelchair Mobility     Tilt Bed    Modified Rankin (Stroke Patients Only) Modified Rankin (Stroke Patients Only) Pre-Morbid Rankin Score: Slight disability Modified Rankin: Severe disability     Balance Overall balance assessment: Needs assistance Sitting-balance support: Bilateral upper extremity supported, Feet supported Sitting balance-Leahy Scale: Fair Sitting balance - Comments: Pt observed leaning to L side and requiring MAX A to maintain balance while seated EOB. Pt progressed to supervision briefly Postural control: Posterior lean, Left lateral lean Standing balance support: Bilateral upper extremity supported, Reliant on assistive device for balance Standing balance-Leahy Scale: Poor Standing balance comment: Pt reliant on stedy to maintain support. Pt initially required MAX A to maintain standing balance but progressed to MOD with brief periods of MIN A and verbal/tactile cues to maintain balance and upright standing posture.                            Communication Communication Communication: Impaired Factors Affecting Communication: Reduced clarity of speech  Cognition Arousal: Alert Behavior During Therapy: [facility] for  tasks assessed/performed                             Following commands: Impaired Following commands impaired: Follows one step commands inconsistently, Follows one step commands with increased time    Cueing  Cueing Techniques: Verbal cues, Gestural cues, Tactile cues  Exercises      General Comments General comments (skin integrity, edema, etc.): VSS      Pertinent Vitals/Pain Pain Assessment Pain Assessment: Faces Faces Pain Scale: Hurts a little bit Pain Location: generalized with movement Pain Descriptors / Indicators: Grimacing Pain Intervention(s): Monitored during session, Limited activity within patient's tolerance, Repositioned    Home Living                          Prior Function            PT Goals (current goals can now be found in the care plan section) Progress towards PT goals: Progressing toward goals    Frequency    Min 2X/week      PT Plan      Co-evaluation              AM-PAC PT "6 Clicks" Mobility   Outcome Measure  Help needed turning from your back to your side while in a flat bed without using bedrails?: A Lot Help needed moving from lying on your back to sitting on the side of a flat bed without using bedrails?: A Lot Help needed moving to and from a bed to a chair (including a wheelchair)?: Total Help needed standing up from a chair using your arms (e.g., wheelchair or bedside chair)?: Total Help needed to walk in hospital room?: Total Help needed climbing 3-5 steps with a railing? : Total 6 Click Score: 8    End of Session Equipment Utilized During Treatment: Gait belt Activity Tolerance: Patient tolerated treatment well Patient left: in bed;with call bell/phone within reach;with bed alarm set;with family/visitor present Nurse Communication: Mobility status;Need for lift equipment PT Visit Diagnosis: Other abnormalities of gait and mobility (R26.89);Muscle weakness (generalized) (M62.81)     Time: 1450-1511 PT Time Calculation (min) (ACUTE ONLY): 21 min  Charges:    $Therapeutic Activity: 8-22 mins PT General Charges $$ ACUTE PT VISIT: 1 Visit                     Shela Nevin, PT, DPT Acute Rehab Services [phone]     Gladys Damme 06/16/2023, 4:12 PM

## 2023-06-17 DIAGNOSIS — I629 Nontraumatic intracranial hemorrhage, unspecified: Secondary | ICD-10-CM | POA: Diagnosis not present

## 2023-06-17 DIAGNOSIS — I610 Nontraumatic intracerebral hemorrhage in hemisphere, subcortical: Secondary | ICD-10-CM | POA: Diagnosis not present

## 2023-06-17 DIAGNOSIS — I6389 Other cerebral infarction: Secondary | ICD-10-CM | POA: Diagnosis not present

## 2023-06-17 DIAGNOSIS — E43 Unspecified severe protein-calorie malnutrition: Secondary | ICD-10-CM | POA: Diagnosis not present

## 2023-06-17 LAB — GLUCOSE, CAPILLARY
Glucose-Capillary: 146 mg/dL — ABNORMAL HIGH (ref 70–99)
Glucose-Capillary: 129 mg/dL — ABNORMAL HIGH (ref 70–99)
Glucose-Capillary: 117 mg/dL — ABNORMAL HIGH (ref 70–99)
Glucose-Capillary: 140 mg/dL — ABNORMAL HIGH (ref 70–99)
Glucose-Capillary: 148 mg/dL — ABNORMAL HIGH (ref 70–99)
Glucose-Capillary: 137 mg/dL — ABNORMAL HIGH (ref 70–99)
Glucose-Capillary: 124 mg/dL — ABNORMAL HIGH (ref 70–99)

## 2023-06-17 MED ORDER — ENSURE ENLIVE PO LIQD
237.0000 mL | Freq: Two times a day (BID) | ORAL | Status: DC
  Administered 2023-06-18 – 2023-06-23 (×11): 237 mL via ORAL

## 2023-06-17 MED ORDER — OSMOLITE 1.5 CAL PO LIQD
400.0000 mL | ORAL | Status: DC
  Administered 2023-06-17 – 2023-06-19 (×3): 400 mL

## 2023-06-17 NOTE — Progress Notes (Addendum)
 Calorie Count Note  24 hour calorie count ordered to assess intake in attempt to decrease rate/duration of tube feed to allow for PO intake now that he has advanced to regular diet. Pt appears to be adequately meeting needs x24 hours. Will recommend nocturnal feedings at lower rate to supplement nutrition and assess for ability to transition to bolus feedings (either scheduled or PRN) next week.  Diet: Regular diet, thin liquids Supplements: Ensure Enlive po daily, each supplement provides 350 kcal and 20 grams of protein  Meal intake from 3/27 - 3/28 Dinner: 100% green peas, 20% hummus w/ pita and vegetables, 100% chocolate chip cookie ( 307 calories, 9g protein)  Breakfast: 100% scrambled eggs, french toast, and banana, 40% hot tea (499 calories, 18g protein) Lunch: 75% of quesadilla, vegetable soup, fresh fruit salad, gengerale, and sour cream (564 calories, 20g protein) Supplements: Ensure 100% x1  Total intake: 1720 kcal (105% of minimum estimated needs)  64g protein (71% of minimum estimated needs)  Nutrition Dx: Severe Malnutrition related to chronic illness as evidenced by severe fat depletion, severe muscle depletion. - remains applicable   Goal: Patient will meet greater than or equal to 90% of their needs - progressing   INTERVENTION: Modify TF regimen to be administered via g-tube overnight: Osmolite 1.5 at 40 ml/h from 8PM-6AM (400 ml per day) Prosource TF20 60 ml daily FWF added per MD - Q4 hours   Provides 680kcal, 45 gm protein, free water daily   MVI with minerals daily via tube  Banatrol BID, each packet provides 5g soluble fiber Ensure Enlive po daily, each supplement provides 350 kcal and 20 grams of protein.   Myrtie Cruise MS, RD, LDN Registered Dietitian Clinical Nutrition RD Inpatient Contact Info in Amion

## 2023-06-17 NOTE — Plan of Care (Signed)
 Palliative-   Chart reviewed. Noted patient is making great improvements. Current issue is disposition. No acute Palliative needs.  Palliative will sign off for now.  Please reconsult if needs arise.   Ocie Bob, AGNP-C Palliative Medicine  No charge

## 2023-06-17 NOTE — Plan of Care (Addendum)

## 2023-06-17 NOTE — Plan of Care (Signed)
  Problem: Health Behavior/Discharge Planning: Goal: Ability to manage health-related needs will improve Outcome: Progressing   Problem: Activity: Goal: Risk for activity intolerance will decrease Outcome: Progressing   Problem: Nutrition: Goal: Adequate nutrition will be maintained Outcome: Progressing   

## 2023-06-17 NOTE — Progress Notes (Signed)
 PROGRESS NOTE  Derek Yates ZOX:[id number] DOB: 1940-08-14   PCP: [name], MD  Patient is from: Home.  DOA: 05/11/2023 LOS: 37  Chief complaints No chief complaint on file.    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 He was admitted to neuro ICU, eventually stabilized and transferred to the hospitalist service. Hospital course complicated by aspiration pneumonia, failure to progress, persistent dysphagia and eventually had to have a PEG tube placed on 3/18.  He was on tube feed.  Upgraded to regular diet on 3/26.  P.o. intake improved.  Insurance denied LTACH even after appeal.  TOC and family working on SNF  Subjective: Seen and examined earlier this morning.  No major events overnight of this morning.  He is awake.  No complaints.  Objective: Vitals:   06/16/23 2337 06/17/23 0413 06/17/23 0839 06/17/23 1139  BP: 125/85 128/71 130/69 128/67  Pulse: 100 85 (!) 106 88  Resp: 18 16 18 18   Temp: 98.2 F (36.8 C) 98.9 F (37.2 C) (!) 97.4 F (36.3 C) 98.6 F (37 C)  TempSrc: Oral  Oral Oral  SpO2: 97% 100% 96% 96%  Weight:      Height:        Examination:  GENERAL: No apparent distress.  Nontoxic. HEENT: MMM.  Vision and hearing grossly intact.  NECK: Supple.  No apparent JVD.  RESP:  No IWOB.  Fair aeration bilaterally. CVS:  RRR. Heart sounds normal.  ABD/GI/GU: BS+. Abd soft, NTND.  G-tube in place. MSK/EXT:  Moves extremities. No apparent deformity. No edema.  Bilateral mittens. SKIN: no apparent skin lesion or wound NEURO: Awake.  Oriented to self and place.  Follows commands.  Follows commands. PSYCH: Calm. Normal affect.   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  Given underlying dementia palliative was consulted, GOC discussions were held and ongoing medical care has been established.  Due to persistent dysphagia, PEG tube placed by general surgery on 3/18, and was on tube feed.  Created to regular diet on 3/26 -Continue tube feed and regular diet. -Calorie count underway. -LTACH denied even after appeal.  Working on SNF placement.   Recurrent aspiration pneumonia: Completed antibiotic course for this.  He now has a PEG tube.  On room air. -Aspiration precaution   Acute respiratory failure with hypoxia due to aspiration pneumonia: Resolved.   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 Continue statins.  Severe dementia without behavioral disturbance/delirium: Awake and alert but only oriented to self and place.  Follows commands. -Reorientation and delirium precautions  Dysphagia: PEG tube placed on 3/18 by surgery and was on TF. Upgraded to regular diet on 3/26. -Continue tube feed until calorie count is finalized   Oral thrush -Received Diflucan from 3/16-3/24.  Also received nystatin.   Acute kidney injury-Likely hemodynamic mediated.  Resolved.   Essential hypertension: Normotensive -Continue amlodipine   Hyperlipidemia  -Continue statins.    Severe malnutrition/dysphagia Body mass index is 20.11 kg/m. Nutrition Problem: Severe Malnutrition Etiology: chronic illness  Signs/Symptoms: severe fat depletion, severe muscle depletion Interventions: Tube feeding, MVI, Prostat   DVT prophylaxis:  heparin injection 5,000 Units Start: 05/15/23 1400 SCD's Start: 05/11/23 2019  Code Status:  DNR/DNI Family Communication: None at bedside today. Level of care: Med-Surg Status is: Inpatient Remains inpatient appropriate because: SNF bed.   Final disposition: SNF  35 minutes with more than 50% spent in reviewing records, counseling patient/family and coordinating care.   Sch Meds:  Scheduled Meds:  amLODipine  10 mg Per Tube Daily   feeding supplement (PROSource TF20)  60 mL Per Tube Daily   fiber supplement (BANATROL TF)  60 mL Per Tube BID   free water  200 mL Per Tube Q4H   guaiFENesin  15 mL Per Tube Q6H   heparin injection (subcutaneous)  5,000 Units Subcutaneous Q12H   multivitamin with minerals  1 tablet Per Tube Daily   nystatin  5 mL Oral TID AC & HS   mouth rinse  15 mL Mouth Rinse 4 times per day   Continuous Infusions:  feeding supplement (OSMOLITE 1.5 CAL) 50 mL/hr at 06/17/23 1201   PRN Meds:.acetaminophen **OR** acetaminophen (TYLENOL) oral liquid 160 mg/5 mL **OR** acetaminophen, glycopyrrolate, naphazoline-pheniramine, mouth rinse, mouth rinse  Antimicrobials: Anti-infectives (From admission, onward)    Start     Dose/Rate Route Frequency Ordered Stop   06/11/23 1800  fluconazole (DIFLUCAN) tablet 100 mg  Status:  Discontinued        100 mg Per Tube Every evening 06/11/23 1526 06/13/23 1052   06/08/23 1600  fluconazole (DIFLUCAN) IVPB 100 mg  Status:  Discontinued        100 mg 50 mL/hr over 60 Minutes Intravenous Every 24 hours 06/07/23 1457 06/11/23 1526   06/07/23 1545  fluconazole (DIFLUCAN) IVPB 200 mg        200 mg 100 mL/hr over 60 Minutes Intravenous  Once 06/07/23 1457 06/07/23 1812   06/07/23 0600  ceFAZolin (ANCEF) IVPB 2g/100 mL premix        2 g 200 mL/hr over 30 Minutes Intravenous  Once 06/06/23 1423 06/07/23 0940   06/02/23 2200  metroNIDAZOLE (FLAGYL) IVPB 500 mg        500 mg 100 mL/hr over 60 Minutes Intravenous Every 12 hours 06/02/23 0908 06/02/23 2246   06/02/23 1400  ceFEPIme (MAXIPIME) 2 g in sodium chloride 0.9 % 100 mL  IVPB        2 g 200 mL/hr over 30 Minutes Intravenous Every 8 hours 06/02/23 0908 06/02/23 1454   05/23/23 1130  metroNIDAZOLE (FLAGYL) IVPB 500 mg  Status:  Discontinued        500 mg 100 mL/hr over 60 Minutes Intravenous Every 12 hours 05/23/23 1033 06/02/23 0908   05/22/23 1330  ceFEPIme (MAXIPIME) 2 g in sodium chloride 0.9 % 100 mL IVPB  Status:  Discontinued        2 g 200 mL/hr over 30 Minutes Intravenous Every 8 hours 05/22/23 1257 06/02/23 0908   05/20/23 1415  amoxicillin-clavulanate (AUGMENTIN) 400-57 MG/5ML suspension 500 mg  Status:  Discontinued        500 mg Per Tube Every 8 hours 05/20/23 1323 05/22/23 1257   05/20/23 1000  amoxicillin-clavulanate (AUGMENTIN) 875-125 MG per tablet 1 tablet  Status:  Discontinued        1 tablet Oral Every 12 hours 05/20/23 0716 05/20/23 0723   05/20/23 0815  amoxicillin-clavulanate (AUGMENTIN) 250-62.5 MG/5ML suspension 500 mg  Status:  Discontinued  500 mg Per Tube Every 8 hours 05/20/23 0723 05/20/23 1323   05/17/23 1400  metroNIDAZOLE (FLAGYL) IVPB 500 mg  Status:  Discontinued        500 mg 100 mL/hr over 60 Minutes Intravenous Every 12 hours 05/17/23 1322 05/20/23 0716   05/17/23 1130  linezolid (ZYVOX) IVPB 600 mg  Status:  Discontinued        600 mg 300 mL/hr over 60 Minutes Intravenous Every 12 hours 05/17/23 1043 05/17/23 1322   05/16/23 1100  ceFEPIme (MAXIPIME) 2 g in sodium chloride 0.9 % 100 mL IVPB  Status:  Discontinued        2 g 200 mL/hr over 30 Minutes Intravenous Every 12 hours 05/16/23 1012 05/20/23 0716   05/15/23 1400  cefTRIAXone (ROCEPHIN) 2 g in sodium chloride 0.9 % 100 mL IVPB  Status:  Discontinued        2 g 200 mL/hr over 30 Minutes Intravenous Every 24 hours 05/15/23 1313 05/16/23 1012   05/15/23 1345  cefTRIAXone (ROCEPHIN) 1 g in sodium chloride 0.9 % 100 mL IVPB  Status:  Discontinued        1 g 200 mL/hr over 30 Minutes Intravenous Every 24 hours 05/15/23 1254 05/15/23 1313   05/11/23 2230   cefTRIAXone (ROCEPHIN) 1 g in sodium chloride 0.9 % 100 mL IVPB        1 g 200 mL/hr over 30 Minutes Intravenous Daily at bedtime 05/11/23 2153 05/12/23 2209        I have personally reviewed the following labs and images: CBC: Recent Labs  Lab 06/12/23 0650  WBC 6.8  HGB 13.4  HCT 38.3*  MCV 84.5  PLT 256   BMP &GFR Recent Labs  Lab 06/12/23 0650  NA 138  K 4.3  CL 99  CO2 27  GLUCOSE 105*  BUN 16  CREATININE 0.59*  CALCIUM 9.3  MG 2.3  PHOS 4.0   Estimated Creatinine Clearance: 57.3 mL/min (A) (by C-G formula based on SCr of 0.59 mg/dL (L)). Liver & Pancreas: Recent Labs  Lab 06/12/23 0650  AST 21  ALT 17  ALKPHOS 79  BILITOT 0.3  PROT 7.4  ALBUMIN 2.9*   No results for input(s): "LIPASE", "AMYLASE" in the last 168 hours. No results for input(s): "AMMONIA" in the last 168 hours. Diabetic: No results for input(s): "HGBA1C" in the last 72 hours. Recent Labs  Lab 06/16/23 2049 06/16/23 2336 06/17/23 0413 06/17/23 0839 06/17/23 1136  GLUCAP 146* 129* 129* 117* 140*   Cardiac Enzymes: No results for input(s): "CKTOTAL", "CKMB", "CKMBINDEX", "TROPONINI" in the last 168 hours. No results for input(s): "PROBNP" in the last 8760 hours. Coagulation Profile: No results for input(s): "INR", "PROTIME" in the last 168 hours. Thyroid Function Tests: No results for input(s): "TSH", "T4TOTAL", "FREET4", "T3FREE", "THYROIDAB" in the last 72 hours. Lipid Profile: No results for input(s): "CHOL", "HDL", "LDLCALC", "TRIG", "CHOLHDL", "LDLDIRECT" in the last 72 hours. Anemia Panel: No results for input(s): "VITAMINB12", "FOLATE", "FERRITIN", "TIBC", "IRON", "RETICCTPCT" in the last 72 hours. Urine analysis:    Component Value Date/Time   COLORURINE YELLOW 05/17/2023 1430   APPEARANCEUR CLEAR 05/17/2023 1430   APPEARANCEUR Clear 07/23/2021 1535   LABSPEC 1.026 05/17/2023 1430   PHURINE 5.0 05/17/2023 1430   GLUCOSEU NEGATIVE 05/17/2023 1430   HGBUR NEGATIVE  05/17/2023 1430   BILIRUBINUR NEGATIVE 05/17/2023 1430   BILIRUBINUR 1+ 05/04/2023 1015   BILIRUBINUR Negative 07/23/2021 1535   KETONESUR NEGATIVE 05/17/2023 1430   PROTEINUR NEGATIVE  05/17/2023 1430   UROBILINOGEN 0.2 (A) 05/04/2023 1015   NITRITE NEGATIVE 05/17/2023 1430   LEUKOCYTESUR NEGATIVE 05/17/2023 1430   Sepsis Labs: Invalid input(s): "PROCALCITONIN", "LACTICIDVEN"  Microbiology: No results found for this or any previous visit (from the past 240 hours).  Radiology Studies: No results found.    Derek Eddie T. Derek Yates [facility]  If 7PM-7AM, please contact night-coverage [email or web address] 06/17/2023, 2:08 PM

## 2023-06-17 NOTE — TOC Progression Note (Signed)
 Transition of Care [facility]) - Progression Note    Patient Details  Name: Derek Yates MRN: [id number] Date of Birth: [birth year]  Transition of Care [facility]) CM/SW Contact  [name], [place] Phone Number: 06/17/2023, 2:18 PM  Clinical Narrative:   CSW has not received a response yet from [facility], Admissions still out of office today. CSW attempted to reach son, Beckey Downing, to check in, left a message. CSW to follow.    Expected Discharge Plan: Skilled Nursing Facility Barriers to Discharge: Continued Medical Work up, SNF Pending bed offer, English as a second language teacher  Expected Discharge Plan and Services   Discharge Planning Services: CM Consult Post Acute Care Choice: Hospice Living arrangements for the past 2 months: Single Family Home                             [facility] Agency: [facility] Date [facility] Agency Contacted: 05/31/23   Representative spoke with at [facility] Agency: Efraim Kaufmann   Social Determinants of Health (SDOH) Interventions SDOH Screenings   Food Insecurity: Patient Unable To Answer (05/13/2023)  Housing: Patient Unable To Answer (05/13/2023)  Transportation Needs: Patient Unable To Answer (05/13/2023)  Utilities: Patient Unable To Answer (05/13/2023)  Depression (PHQ2-9): Medium Risk (02/10/2023)  Financial Resource Strain: Low Risk  (06/20/2021)   Received from [facility], [facility]  Social Connections: Patient Unable To Answer (05/13/2023)  Tobacco Use: Low Risk  (06/07/2023)    Readmission Risk Interventions     No data to display

## 2023-06-17 NOTE — Progress Notes (Addendum)
 Speech Language Pathology Treatment: Dysphagia  Patient Details Name: Derek Yates MRN: [id number] DOB: [birth year] Today's Date: 06/17/2023 Time: 4403-4742 SLP Time Calculation (min) (ACUTE ONLY): 10 min  Assessment / Plan / Recommendation Clinical Impression  Pt continues to make remarkable gains with eating/swallowing. He was feeding himself breakfast when I entered the room, using a knife and fork independently, swallowing french toast, bananas, thin liquids with no further s/s of dysphagia.  No cueing or prompts needed.  Pt interactive and smiling. He has met his goals - there are no further SLP  needs.  Recommend that he continue on regular diet, thin liquids; can give meds whole in applesauce.  SLP will sign off.    HPI HPI: Patient is an 83 y.o. male with PMH: dementia, recurrent UTI's, CVA, HTN. He presented to the hospital on 05/11/23 as a code stroke with speech deficits and left sided weakness. 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 Patient has been NPO; Cortrak placed 2/21. MBS 3/10 - recs for NPO due primarily to AMS.  Pt was followed by palliative care; decision had been made to shift to comfort care, then reversed by family and SLP was re-reconsulted to repeat swallow assessment on 3/13. PEG 3/18.      SLP Plan  Goals met; no further SLP f/u is needed.      Recommendations for follow up therapy are one component of a multi-disciplinary discharge planning process, led by the attending physician.  Recommendations may be updated based on patient status, additional functional criteria and insurance authorization.    Recommendations  Diet recommendations: Regular;Thin liquid Liquids provided via: Cup;Straw Medication Administration: whole with puree Supervision: Patient able to self feed Postural Changes and/or Swallow Maneuvers: Seated upright 90 degrees                   Oral care BID   Frequent or constant Supervision/Assistance Dysphagia, oropharyngeal phase (R13.12)     No SLP f/u needed.    Rody Keadle L. Samson Frederic, MA CCC/SLP Clinical Specialist - Acute Care SLP Acute Rehabilitation Services Office number [phone]  Blenda Mounts Laurice  06/17/2023, 9:40 AM

## 2023-06-18 DIAGNOSIS — I610 Nontraumatic intracerebral hemorrhage in hemisphere, subcortical: Secondary | ICD-10-CM | POA: Diagnosis not present

## 2023-06-18 DIAGNOSIS — I629 Nontraumatic intracranial hemorrhage, unspecified: Secondary | ICD-10-CM | POA: Diagnosis not present

## 2023-06-18 DIAGNOSIS — I6389 Other cerebral infarction: Secondary | ICD-10-CM | POA: Diagnosis not present

## 2023-06-18 DIAGNOSIS — E43 Unspecified severe protein-calorie malnutrition: Secondary | ICD-10-CM | POA: Diagnosis not present

## 2023-06-18 LAB — GLUCOSE, CAPILLARY
Glucose-Capillary: 108 mg/dL — ABNORMAL HIGH (ref 70–99)
Glucose-Capillary: 114 mg/dL — ABNORMAL HIGH (ref 70–99)
Glucose-Capillary: 156 mg/dL — ABNORMAL HIGH (ref 70–99)
Glucose-Capillary: 103 mg/dL — ABNORMAL HIGH (ref 70–99)

## 2023-06-18 NOTE — Plan of Care (Signed)
  Problem: Clinical Measurements: Goal: Ability to maintain clinical measurements within normal limits will improve Outcome: Progressing Goal: Diagnostic test results will improve Outcome: Progressing   

## 2023-06-18 NOTE — Progress Notes (Signed)
 PROGRESS NOTE  Derek Yates FAO:[id number] DOB: [birth year]   PCP: [name], MD  Patient is from: Home.  DOA: 05/11/2023 LOS: 38  Chief complaints No chief complaint on file.    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 He was admitted to neuro ICU, eventually stabilized and transferred to the hospitalist service. Hospital course complicated by aspiration pneumonia, failure to progress, persistent dysphagia and eventually had to have a PEG tube placed on 3/18.  He was on tube feed.  Upgraded to regular diet on 3/26.  P.o. intake improved.  TF decreased to nocturnal.  Insurance denied LTACH even after appeal.  TOC and family working on SNF  Subjective: Seen and examined earlier this morning.  No major events overnight of this morning.  [name] son at bedside.  Unhappy about his nursing care overnight.  He says, he found patient naked and in disarray when he walked in this morning.  He also reports difficulty swallowing some meat.  Asking if his diet can be changed to soft.  Patient has no complaints but not a great historian.  Objective: Vitals:   06/18/23 0500 06/18/23 0812 06/18/23 1145 06/18/23 1200  BP:  135/67 (!) 116/59   Pulse:  100 100   Resp:  19  20  Temp:  99.4 F (37.4 C) 99.3 F (37.4 C)   TempSrc:  Oral Axillary   SpO2:  94% 96%   Weight: 62.9 kg     Height:        Examination:  GENERAL: No apparent distress.  Nontoxic. HEENT: MMM.  Vision and hearing grossly intact.  NECK: Supple.  No apparent JVD.  RESP:  No IWOB.  Fair aeration bilaterally. CVS:  RRR. Heart sounds normal.  ABD/GI/GU: BS+. Abd soft, NTND.  G-tube in place. MSK/EXT:  Moves extremities. No apparent deformity. No edema.  SKIN: no apparent skin lesion or wound NEURO: Awake.   Oriented to self place and person.  Follows commands.  PSYCH: Calm. Normal affect.   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  Given underlying dementia palliative was consulted, GOC discussions were held and ongoing medical care has been established.  Due to persistent dysphagia, PEG tube placed by general surgery on 3/18.  Advanced to regular diet on 3/26 -Changed diet to soft-dysphagia 3 diet at son's request -TF decreased to nocturnal. -Calorie count underway-p.o. intake -LTACH denied even after appeal.  Working on SNF placement.   Recurrent aspiration pneumonia: Completed antibiotic course for this.  He now has a PEG tube.  On room air. -Aspiration precaution   Acute respiratory failure with hypoxia due to aspiration pneumonia: Resolved.   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 Continue statins.  Severe dementia without behavioral disturbance/delirium: Awake and alert but only oriented to self and place.  Follows commands. -Reorientation and delirium precautions  Dysphagia: PEG tube placed on 3/18 by surgery and was on TF. Upgraded to regular diet on 3/26. -TF and diet as above.   Oral thrush -Received Diflucan from  3/16-3/24.  Also received nystatin.   Acute kidney injury-Likely hemodynamic mediated.  Resolved.   Essential hypertension: Normotensive -Continue amlodipine   Hyperlipidemia  -Continue statins.    Severe malnutrition/dysphagia Body mass index is 21.08 kg/m. Nutrition Problem: Severe Malnutrition Etiology: chronic illness Signs/Symptoms:  severe fat depletion, severe muscle depletion Interventions: Tube feeding, MVI, Prostat   DVT prophylaxis:  heparin injection 5,000 Units Start: 05/15/23 1400 SCD's Start: 05/11/23 2019  Code Status: DNR/DNI Family Communication: Updated patient's son at bedside. Level of care: Med-Surg Status is: Inpatient Remains inpatient appropriate because: SNF bed.   Final disposition: SNF  35 minutes with more than 50% spent in reviewing records, counseling patient/family and coordinating care.   Sch Meds:  Scheduled Meds:  amLODipine  10 mg Per Tube Daily   feeding supplement  237 mL Oral BID BM   feeding supplement (OSMOLITE 1.5 CAL)  400 mL Per Tube Q24H   feeding supplement (PROSource TF20)  60 mL Per Tube Daily   fiber supplement (BANATROL TF)  60 mL Per Tube BID   guaiFENesin  15 mL Per Tube Q6H   heparin injection (subcutaneous)  5,000 Units Subcutaneous Q12H   multivitamin with minerals  1 tablet Per Tube Daily   Continuous Infusions:   PRN Meds:.acetaminophen **OR** acetaminophen (TYLENOL) oral liquid 160 mg/5 mL **OR** acetaminophen, glycopyrrolate, naphazoline-pheniramine, mouth rinse  Antimicrobials: Anti-infectives (From admission, onward)    Start     Dose/Rate Route Frequency Ordered Stop   06/11/23 1800  fluconazole (DIFLUCAN) tablet 100 mg  Status:  Discontinued        100 mg Per Tube Every evening 06/11/23 1526 06/13/23 1052   06/08/23 1600  fluconazole (DIFLUCAN) IVPB 100 mg  Status:  Discontinued        100 mg 50 mL/hr over 60 Minutes Intravenous Every 24 hours 06/07/23 1457 06/11/23 1526   06/07/23 1545  fluconazole (DIFLUCAN) IVPB 200 mg        200 mg 100 mL/hr over 60 Minutes Intravenous  Once 06/07/23 1457 06/07/23 1812   06/07/23 0600  ceFAZolin (ANCEF) IVPB 2g/100 mL premix        2 g 200 mL/hr over 30 Minutes Intravenous  Once 06/06/23 1423 06/07/23 0940   06/02/23 2200  metroNIDAZOLE (FLAGYL) IVPB 500 mg        500 mg 100 mL/hr over 60 Minutes  Intravenous Every 12 hours 06/02/23 0908 06/02/23 2246   06/02/23 1400  ceFEPIme (MAXIPIME) 2 g in sodium chloride 0.9 % 100 mL IVPB        2 g 200 mL/hr over 30 Minutes Intravenous Every 8 hours 06/02/23 0908 06/02/23 1454   05/23/23 1130  metroNIDAZOLE (FLAGYL) IVPB 500 mg  Status:  Discontinued        500 mg 100 mL/hr over 60 Minutes Intravenous Every 12 hours 05/23/23 1033 06/02/23 0908   05/22/23 1330  ceFEPIme (MAXIPIME) 2 g in sodium chloride 0.9 % 100 mL IVPB  Status:  Discontinued        2 g 200 mL/hr over 30 Minutes Intravenous Every 8 hours 05/22/23 1257 06/02/23 0908   05/20/23 1415  amoxicillin-clavulanate (AUGMENTIN) 400-57 MG/5ML suspension 500 mg  Status:  Discontinued        500 mg Per Tube Every 8 hours 05/20/23 1323 05/22/23 1257   05/20/23 1000  amoxicillin-clavulanate (AUGMENTIN) 875-125 MG per tablet 1 tablet  Status:  Discontinued        1 tablet Oral Every 12 hours 05/20/23 0716 05/20/23  4098   05/20/23 0815  amoxicillin-clavulanate (AUGMENTIN) 250-62.5 MG/5ML suspension 500 mg  Status:  Discontinued        500 mg Per Tube Every 8 hours 05/20/23 0723 05/20/23 1323   05/17/23 1400  metroNIDAZOLE (FLAGYL) IVPB 500 mg  Status:  Discontinued        500 mg 100 mL/hr over 60 Minutes Intravenous Every 12 hours 05/17/23 1322 05/20/23 0716   05/17/23 1130  linezolid (ZYVOX) IVPB 600 mg  Status:  Discontinued        600 mg 300 mL/hr over 60 Minutes Intravenous Every 12 hours 05/17/23 1043 05/17/23 1322   05/16/23 1100  ceFEPIme (MAXIPIME) 2 g in sodium chloride 0.9 % 100 mL IVPB  Status:  Discontinued        2 g 200 mL/hr over 30 Minutes Intravenous Every 12 hours 05/16/23 1012 05/20/23 0716   05/15/23 1400  cefTRIAXone (ROCEPHIN) 2 g in sodium chloride 0.9 % 100 mL IVPB  Status:  Discontinued        2 g 200 mL/hr over 30 Minutes Intravenous Every 24 hours 05/15/23 1313 05/16/23 1012   05/15/23 1345  cefTRIAXone (ROCEPHIN) 1 g in sodium chloride 0.9 % 100 mL IVPB  Status:   Discontinued        1 g 200 mL/hr over 30 Minutes Intravenous Every 24 hours 05/15/23 1254 05/15/23 1313   05/11/23 2230  cefTRIAXone (ROCEPHIN) 1 g in sodium chloride 0.9 % 100 mL IVPB        1 g 200 mL/hr over 30 Minutes Intravenous Daily at bedtime 05/11/23 2153 05/12/23 2209        I have personally reviewed the following labs and images: CBC: Recent Labs  Lab 06/12/23 0650  WBC 6.8  HGB 13.4  HCT 38.3*  MCV 84.5  PLT 256   BMP &GFR Recent Labs  Lab 06/12/23 0650  NA 138  K 4.3  CL 99  CO2 27  GLUCOSE 105*  BUN 16  CREATININE 0.59*  CALCIUM 9.3  MG 2.3  PHOS 4.0   Estimated Creatinine Clearance: 60.1 mL/min (A) (by C-G formula based on SCr of 0.59 mg/dL (L)). Liver & Pancreas: Recent Labs  Lab 06/12/23 0650  AST 21  ALT 17  ALKPHOS 79  BILITOT 0.3  PROT 7.4  ALBUMIN 2.9*   No results for input(s): "LIPASE", "AMYLASE" in the last 168 hours. No results for input(s): "AMMONIA" in the last 168 hours. Diabetic: No results for input(s): "HGBA1C" in the last 72 hours. Recent Labs  Lab 06/17/23 1618 06/17/23 1959 06/17/23 2321 06/18/23 0810 06/18/23 1146  GLUCAP 148* 137* 124* 108* 114*   Cardiac Enzymes: No results for input(s): "CKTOTAL", "CKMB", "CKMBINDEX", "TROPONINI" in the last 168 hours. No results for input(s): "PROBNP" in the last 8760 hours. Coagulation Profile: No results for input(s): "INR", "PROTIME" in the last 168 hours. Thyroid Function Tests: No results for input(s): "TSH", "T4TOTAL", "FREET4", "T3FREE", "THYROIDAB" in the last 72 hours. Lipid Profile: No results for input(s): "CHOL", "HDL", "LDLCALC", "TRIG", "CHOLHDL", "LDLDIRECT" in the last 72 hours. Anemia Panel: No results for input(s): "VITAMINB12", "FOLATE", "FERRITIN", "TIBC", "IRON", "RETICCTPCT" in the last 72 hours. Urine analysis:    Component Value Date/Time   COLORURINE YELLOW 05/17/2023 1430   APPEARANCEUR CLEAR 05/17/2023 1430   APPEARANCEUR Clear 07/23/2021  1535   LABSPEC 1.026 05/17/2023 1430   PHURINE 5.0 05/17/2023 1430   GLUCOSEU NEGATIVE 05/17/2023 1430   HGBUR NEGATIVE 05/17/2023 1430   BILIRUBINUR NEGATIVE  05/17/2023 1430   BILIRUBINUR 1+ 05/04/2023 1015   BILIRUBINUR Negative 07/23/2021 1535   KETONESUR NEGATIVE 05/17/2023 1430   PROTEINUR NEGATIVE 05/17/2023 1430   UROBILINOGEN 0.2 (A) 05/04/2023 1015   NITRITE NEGATIVE 05/17/2023 1430   LEUKOCYTESUR NEGATIVE 05/17/2023 1430   Sepsis Labs: Invalid input(s): "PROCALCITONIN", "LACTICIDVEN"  Microbiology: No results found for this or any previous visit (from the past 240 hours).  Radiology Studies: No results found.    Cierrah Dace T. Morgen Ritacco [facility]  If 7PM-7AM, please contact night-coverage [email or web address] 06/18/2023, 4:06 PM

## 2023-06-18 NOTE — Plan of Care (Signed)

## 2023-06-19 DIAGNOSIS — I6389 Other cerebral infarction: Secondary | ICD-10-CM | POA: Diagnosis not present

## 2023-06-19 LAB — GLUCOSE, CAPILLARY
Glucose-Capillary: 124 mg/dL — ABNORMAL HIGH (ref 70–99)
Glucose-Capillary: 107 mg/dL — ABNORMAL HIGH (ref 70–99)
Glucose-Capillary: 112 mg/dL — ABNORMAL HIGH (ref 70–99)
Glucose-Capillary: 118 mg/dL — ABNORMAL HIGH (ref 70–99)
Glucose-Capillary: 122 mg/dL — ABNORMAL HIGH (ref 70–99)
Glucose-Capillary: 125 mg/dL — ABNORMAL HIGH (ref 70–99)

## 2023-06-19 MED ORDER — GUAIFENESIN-DM 100-10 MG/5ML PO SYRP: 5.0000 mL | ORAL_SOLUTION | Freq: Four times a day (QID) | ORAL | Status: DC | PRN

## 2023-06-19 NOTE — Plan of Care (Signed)

## 2023-06-19 NOTE — Progress Notes (Signed)
 PROGRESS NOTE  KASAI BELTRAN QVZ:[id number] DOB: 16-Feb-1941   PCP: [name], MD  Patient is from: Home.  DOA: 05/11/2023 LOS: 39  Chief complaints No chief complaint on file.    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 He was admitted to neuro ICU, eventually stabilized and transferred to the hospitalist service. Hospital course complicated by aspiration pneumonia, failure to progress, persistent dysphagia and eventually had to have a PEG tube placed on 3/18.  He was on tube feed.  Upgraded to regular diet on 3/26.  P.o. intake improved.  TF decreased to nocturnal.  Insurance denied LTACH even after appeal.  TOC and family working on SNF  Subjective: Patient was seen and examined at bedside during morning rounds.  No significant overnight events.  Patient is able to move bilateral upper extremities, having difficulty moving lower extremities, patient will benefit from placement.  Denied any active issues, pain is under control.   Objective: Vitals:   06/19/23 0400 06/19/23 0514 06/19/23 0845 06/19/23 1110  BP: 116/71  118/71 110/71  Pulse: 86  90 (!) 105  Resp: 18  15 18   Temp: 98.5 F (36.9 C)  98.3 F (36.8 C) 98.4 F (36.9 C)  TempSrc: Axillary  Oral Oral  SpO2: 93%  95% 95%  Weight:  63.7 kg    Height:        Examination:  GENERAL: No apparent distress.  Nontoxic. HEENT: MMM.  Vision and hearing grossly intact.  NECK: Supple.  No apparent JVD.  RESP:  No IWOB.  Fair aeration bilaterally. CVS:  RRR. Heart sounds normal.  ABD/GI/GU: BS+. Abd soft, NTND.  G-tube in place. MSK/EXT:  Moves extremities. No apparent deformity. No edema.  SKIN: no apparent skin lesion or wound NEURO: Awake.  Oriented to self place and person.  Follows commands.  PSYCH:  Calm. Normal affect.   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  Given underlying dementia palliative was consulted, GOC discussions were held and ongoing medical care has been established.  Due to persistent dysphagia, PEG tube placed by general surgery on 3/18.  Advanced to regular diet on 3/26 -Changed diet to soft-dysphagia 3 diet at son's request -TF decreased to nocturnal. -Calorie count underway-p.o. intake -LTACH denied even after appeal.  Working on SNF placement.   Recurrent aspiration pneumonia: Completed antibiotic course for this.  He now has a PEG tube.  On room air. -Aspiration precaution   Acute respiratory failure with hypoxia due to aspiration pneumonia: Resolved.   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 Continue statins.  Severe dementia without behavioral disturbance/delirium: Awake and alert but only oriented to self and place.  Follows commands. -Reorientation and delirium precautions  Dysphagia: PEG tube placed on 3/18 by surgery and was on TF. Upgraded to regular diet on 3/26. -TF and diet as above.   Oral thrush -Received Diflucan from 3/16-3/24.  Also received nystatin.   Acute kidney injury-Likely hemodynamic mediated.  Resolved.   Essential hypertension: Normotensive -Continue amlodipine  Hyperlipidemia  -Continue statins.    Severe malnutrition/dysphagia Body mass index is 21.35 kg/m. Nutrition Problem: Severe Malnutrition Etiology: chronic illness Signs/Symptoms: severe fat depletion, severe muscle depletion Interventions: Tube  feeding, MVI, Prostat   DVT prophylaxis:  heparin injection 5,000 Units Start: 05/15/23 1400 SCD's Start: 05/11/23 2019  Code Status: DNR/DNI Family Communication: Updated patient's son at bedside. Level of care: Med-Surg Status is: Inpatient Remains inpatient appropriate because: SNF bed.   Final disposition: SNF  35 minutes with more than 50% spent in reviewing records, counseling patient/family and coordinating care.   Sch Meds:  Scheduled Meds:  amLODipine  10 mg Per Tube Daily   feeding supplement  237 mL Oral BID BM   feeding supplement (OSMOLITE 1.5 CAL)  400 mL Per Tube Q24H   feeding supplement (PROSource TF20)  60 mL Per Tube Daily   fiber supplement (BANATROL TF)  60 mL Per Tube BID   heparin injection (subcutaneous)  5,000 Units Subcutaneous Q12H   multivitamin with minerals  1 tablet Per Tube Daily   Continuous Infusions:   PRN Meds:.acetaminophen **OR** acetaminophen (TYLENOL) oral liquid 160 mg/5 mL **OR** acetaminophen, glycopyrrolate, guaiFENesin-dextromethorphan, naphazoline-pheniramine, mouth rinse  Antimicrobials: Anti-infectives (From admission, onward)    Start     Dose/Rate Route Frequency Ordered Stop   06/11/23 1800  fluconazole (DIFLUCAN) tablet 100 mg  Status:  Discontinued        100 mg Per Tube Every evening 06/11/23 1526 06/13/23 1052   06/08/23 1600  fluconazole (DIFLUCAN) IVPB 100 mg  Status:  Discontinued        100 mg 50 mL/hr over 60 Minutes Intravenous Every 24 hours 06/07/23 1457 06/11/23 1526   06/07/23 1545  fluconazole (DIFLUCAN) IVPB 200 mg        200 mg 100 mL/hr over 60 Minutes Intravenous  Once 06/07/23 1457 06/07/23 1812   06/07/23 0600  ceFAZolin (ANCEF) IVPB 2g/100 mL premix        2 g 200 mL/hr over 30 Minutes Intravenous  Once 06/06/23 1423 06/07/23 0940   06/02/23 2200  metroNIDAZOLE (FLAGYL) IVPB 500 mg        500 mg 100 mL/hr over 60 Minutes Intravenous Every 12 hours 06/02/23 0908 06/02/23 2246   06/02/23 1400   ceFEPIme (MAXIPIME) 2 g in sodium chloride 0.9 % 100 mL IVPB        2 g 200 mL/hr over 30 Minutes Intravenous Every 8 hours 06/02/23 0908 06/02/23 1454   05/23/23 1130  metroNIDAZOLE (FLAGYL) IVPB 500 mg  Status:  Discontinued        500 mg 100 mL/hr over 60 Minutes Intravenous Every 12 hours 05/23/23 1033 06/02/23 0908   05/22/23 1330  ceFEPIme (MAXIPIME) 2 g in sodium chloride 0.9 % 100 mL IVPB  Status:  Discontinued        2 g 200 mL/hr over 30 Minutes Intravenous Every 8 hours 05/22/23 1257 06/02/23 0908   05/20/23 1415  amoxicillin-clavulanate (AUGMENTIN) 400-57 MG/5ML suspension 500 mg  Status:  Discontinued        500 mg Per Tube Every 8 hours 05/20/23 1323 05/22/23 1257   05/20/23 1000  amoxicillin-clavulanate (AUGMENTIN) 875-125 MG per tablet 1 tablet  Status:  Discontinued        1 tablet Oral Every 12 hours 05/20/23 0716 05/20/23 0723   05/20/23 0815  amoxicillin-clavulanate (AUGMENTIN) 250-62.5 MG/5ML suspension 500 mg  Status:  Discontinued        500 mg Per Tube Every 8 hours  05/20/23 0723 05/20/23 1323   05/17/23 1400  metroNIDAZOLE (FLAGYL) IVPB 500 mg  Status:  Discontinued        500 mg 100 mL/hr over 60 Minutes Intravenous Every 12 hours 05/17/23 1322 05/20/23 0716   05/17/23 1130  linezolid (ZYVOX) IVPB 600 mg  Status:  Discontinued        600 mg 300 mL/hr over 60 Minutes Intravenous Every 12 hours 05/17/23 1043 05/17/23 1322   05/16/23 1100  ceFEPIme (MAXIPIME) 2 g in sodium chloride 0.9 % 100 mL IVPB  Status:  Discontinued        2 g 200 mL/hr over 30 Minutes Intravenous Every 12 hours 05/16/23 1012 05/20/23 0716   05/15/23 1400  cefTRIAXone (ROCEPHIN) 2 g in sodium chloride 0.9 % 100 mL IVPB  Status:  Discontinued        2 g 200 mL/hr over 30 Minutes Intravenous Every 24 hours 05/15/23 1313 05/16/23 1012   05/15/23 1345  cefTRIAXone (ROCEPHIN) 1 g in sodium chloride 0.9 % 100 mL IVPB  Status:  Discontinued        1 g 200 mL/hr over 30 Minutes Intravenous Every 24  hours 05/15/23 1254 05/15/23 1313   05/11/23 2230  cefTRIAXone (ROCEPHIN) 1 g in sodium chloride 0.9 % 100 mL IVPB        1 g 200 mL/hr over 30 Minutes Intravenous Daily at bedtime 05/11/23 2153 05/12/23 2209        I have personally reviewed the following labs and images: CBC: No results for input(s): "WBC", "NEUTROABS", "HGB", "HCT", "MCV", "PLT" in the last 168 hours.  BMP &GFR No results for input(s): "NA", "K", "CL", "CO2", "GLUCOSE", "BUN", "CREATININE", "CALCIUM", "MG", "PHOS" in the last 168 hours.  Invalid input(s): "GFRCG"  Estimated Creatinine Clearance: 60.8 mL/min (A) (by C-G formula based on SCr of 0.59 mg/dL (L)). Liver & Pancreas: No results for input(s): "AST", "ALT", "ALKPHOS", "BILITOT", "PROT", "ALBUMIN" in the last 168 hours.  No results for input(s): "LIPASE", "AMYLASE" in the last 168 hours. No results for input(s): "AMMONIA" in the last 168 hours. Diabetic: No results for input(s): "HGBA1C" in the last 72 hours. Recent Labs  Lab 06/18/23 2014 06/19/23 0010 06/19/23 0424 06/19/23 0724 06/19/23 1115  GLUCAP 103* 124* 107* 112* 118*   Cardiac Enzymes: No results for input(s): "CKTOTAL", "CKMB", "CKMBINDEX", "TROPONINI" in the last 168 hours. No results for input(s): "PROBNP" in the last 8760 hours. Coagulation Profile: No results for input(s): "INR", "PROTIME" in the last 168 hours. Thyroid Function Tests: No results for input(s): "TSH", "T4TOTAL", "FREET4", "T3FREE", "THYROIDAB" in the last 72 hours. Lipid Profile: No results for input(s): "CHOL", "HDL", "LDLCALC", "TRIG", "CHOLHDL", "LDLDIRECT" in the last 72 hours. Anemia Panel: No results for input(s): "VITAMINB12", "FOLATE", "FERRITIN", "TIBC", "IRON", "RETICCTPCT" in the last 72 hours. Urine analysis:    Component Value Date/Time   COLORURINE YELLOW 05/17/2023 1430   APPEARANCEUR CLEAR 05/17/2023 1430   APPEARANCEUR Clear 07/23/2021 1535   LABSPEC 1.026 05/17/2023 1430   PHURINE 5.0  05/17/2023 1430   GLUCOSEU NEGATIVE 05/17/2023 1430   HGBUR NEGATIVE 05/17/2023 1430   BILIRUBINUR NEGATIVE 05/17/2023 1430   BILIRUBINUR 1+ 05/04/2023 1015   BILIRUBINUR Negative 07/23/2021 1535   KETONESUR NEGATIVE 05/17/2023 1430   PROTEINUR NEGATIVE 05/17/2023 1430   UROBILINOGEN 0.2 (A) 05/04/2023 1015   NITRITE NEGATIVE 05/17/2023 1430   LEUKOCYTESUR NEGATIVE 05/17/2023 1430   Sepsis Labs: Invalid input(s): "PROCALCITONIN", "LACTICIDVEN"  Microbiology: No results found for this or any  previous visit (from the past 240 hours).  Radiology Studies: No results found.    Taye T. Gonfa [facility]  If 7PM-7AM, please contact night-coverage [email or web address] 06/19/2023, 1:21 PM

## 2023-06-19 NOTE — Plan of Care (Signed)
  Problem: Education: Goal: Knowledge of General Education information will improve Description: Including pain rating scale, medication(s)/side effects and non-pharmacologic comfort measures 06/19/2023 1746 by Quentin Cornwall, RN Outcome: Progressing 06/19/2023 1746 by Quentin Cornwall, RN Outcome: Progressing   Problem: Health Behavior/Discharge Planning: Goal: Ability to manage health-related needs will improve 06/19/2023 1746 by Quentin Cornwall, RN Outcome: Progressing 06/19/2023 1746 by Quentin Cornwall, RN Outcome: Progressing   Problem: Clinical Measurements: Goal: Ability to maintain clinical measurements within normal limits will improve 06/19/2023 1746 by Quentin Cornwall, RN Outcome: Progressing 06/19/2023 1746 by Quentin Cornwall, RN Outcome: Progressing Goal: Will remain free from infection 06/19/2023 1746 by Quentin Cornwall, RN Outcome: Progressing 06/19/2023 1746 by Quentin Cornwall, RN Outcome: Progressing Goal: Diagnostic test results will improve 06/19/2023 1746 by Quentin Cornwall, RN Outcome: Progressing 06/19/2023 1746 by Quentin Cornwall, RN Outcome: Progressing

## 2023-06-20 DIAGNOSIS — E43 Unspecified severe protein-calorie malnutrition: Secondary | ICD-10-CM | POA: Diagnosis not present

## 2023-06-20 DIAGNOSIS — I629 Nontraumatic intracranial hemorrhage, unspecified: Secondary | ICD-10-CM | POA: Diagnosis not present

## 2023-06-20 DIAGNOSIS — I610 Nontraumatic intracerebral hemorrhage in hemisphere, subcortical: Secondary | ICD-10-CM | POA: Diagnosis not present

## 2023-06-20 DIAGNOSIS — I6389 Other cerebral infarction: Secondary | ICD-10-CM | POA: Diagnosis not present

## 2023-06-20 LAB — GLUCOSE, CAPILLARY
Glucose-Capillary: 110 mg/dL — ABNORMAL HIGH (ref 70–99)
Glucose-Capillary: 127 mg/dL — ABNORMAL HIGH (ref 70–99)
Glucose-Capillary: 128 mg/dL — ABNORMAL HIGH (ref 70–99)
Glucose-Capillary: 130 mg/dL — ABNORMAL HIGH (ref 70–99)
Glucose-Capillary: 134 mg/dL — ABNORMAL HIGH (ref 70–99)
Glucose-Capillary: 142 mg/dL — ABNORMAL HIGH (ref 70–99)
Glucose-Capillary: 172 mg/dL — ABNORMAL HIGH (ref 70–99)

## 2023-06-20 MED ORDER — OSMOLITE 1.5 CAL PO LIQD
237.0000 mL | Freq: Three times a day (TID) | ORAL | Status: DC
  Administered 2023-06-21 – 2023-06-23 (×4): 237 mL
  Filled 2023-06-20 (×10): qty 237

## 2023-06-20 MED ORDER — FREE WATER
200.0000 mL | Freq: Four times a day (QID) | Status: DC
  Administered 2023-06-20 – 2023-06-23 (×11): 200 mL

## 2023-06-20 NOTE — Plan of Care (Signed)
°  Problem: Clinical Measurements: Goal: Will remain free from infection Outcome: Progressing   Problem: Education: Goal: Knowledge of General Education information will improve Description: Including pain rating scale, medication(s)/side effects and non-pharmacologic comfort measures Outcome: Not Progressing   Problem: Health Behavior/Discharge Planning: Goal: Ability to manage health-related needs will improve Outcome: Not Progressing   Problem: Clinical Measurements: Goal: Ability to maintain clinical measurements within normal limits will improve Outcome: Not Progressing

## 2023-06-20 NOTE — Progress Notes (Signed)
 Nutrition Follow-up    DOCUMENTATION CODES:   Severe malnutrition in context of chronic illness  INTERVENTION:  Modify TF regimen to be administered via G-tube on an as needed basis: Osmolite 1.5  ( ) to be administered up to 3x daily s/p meal intake if <50% has been consumed Prosource TF20 60 ml daily FWF QID   If all boluses administered, will provide:  1145kcals, 65g protein (bolus + ProSource), free water (TF + FWF)   Feeding assistance with meals  MVI with minerals daily via tube  Discontinue Banatrol BID, each packet provides 5g soluble fiber now that regular diet initiated Continue Ensure Enlive po daily, each supplement provides 350 kcal and 20 grams of protein.   NUTRITION DIAGNOSIS:  Severe Malnutrition related to chronic illness as evidenced by severe fat depletion, severe muscle depletion. 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   2/19 admitted w/ R caudate bleed 2/21 - s/p cortrak placement; tip gastric  2/22 - bedside swallow: NPO, CT head, CXR 2/25 - CT of chest: PNA 2/26 - transfer to 4NP 2/28 - FMS and flatus pouch removed 3/7 - unable to complete MBSS 3/10- MBSS: decline in function 3/11 - swallow edu completed w/ patient's son 3/12 - referral made to residential hospice 3/13 - family would like to pursue PEG. LTACH placement 3/14 - IR: pt not candidate for PEG 3/18 - G-tube placement per surgery, re-initiate TFs  3/20 - MBSS: NPO 3/21 - TF rate increased 3/22- SLP recommends Dys 1 diet 3/24- awaiting SNF placement 3/27 - calorie count 3/28 - transitioned to nocturnal feedings 3/31 - transitioned to  supplemental boluses   Pt tolerated nocturnal feedings well over the weekend. Given that he is receiving such little volume of supplemental nutrition, will recommend transitioning to PRN bolus feedings if <50% of his meal is consumed to allow for patient to meet estimated needs orally. Recommend reinitiation of fluid flushes for hydration. Pt w/ hx of recurrent UTIs of which dehydration is a contributor.   Average Meal Intake 3/28: 75-90% x2 documented meals 3/30: 75-100% x2 documented meals   Pt continues to progress with PT/OT. Up in chair at time of bedside visit. Wife present and requesting to speak with RD regarding patient diet. Discussed transition to PRN bolus feedings, as patient intake has improved to the degree that only supplemental nutrition required. This will also cover intake should his mentation decline. Currently alert and oriented. His son did report a choking episode over the weekend, at which point he requested a dysphagia 3 diet. Pt tolerating well.   His wife did verbalize some concern regarding a missed tray this morning and patient receiving meat for lunch. After speaking with patient's son, Beckey Downing, he clarified to mean patient had received a tray, but needs assistance/encouragement with set up and feeding. Order entered for assistance with feeding. Verified patient did not receive meat for lunch, but a veggie burger. Alerted Riaz to this as well. He is appreciative of updates regarding transition to boluses and diet order.   Admit Weight: 61.9kg Current Weight: 66.9kg Lowest Weight: 58.8kg on 2/25   Wt trend stable/desirable. No edema noted. Bowels regular/stable. Will  recommend discontinuation of fiber supplement now that oral intake is improving.     Meds: MVI, Prosource    Labs: reviewed and unremarkable  Diet Order:   Diet Order             DIET DYS 3 Room service appropriate? Yes; Fluid consistency: Thin  Diet effective now            EDUCATION NEEDS:    Not appropriate for education at this time  Skin:  Skin Assessment: Reviewed RN Assessment (abdominal incision)  Last BM:  3/31 - type 6 x1  Height:  Ht Readings from Last 1 Encounters:  06/07/23 5\' 8"  (1.727 m)   Weight:  Wt Readings from Last 1 Encounters:  06/20/23 66.9 kg   Ideal Body Weight:  70 kg  BMI:  Body mass index is 22.43 kg/m.  Estimated Nutritional Needs:   Kcal:  1750-1950  Protein:  90-110  Fluid:  1 mL/kcal  Myrtie Cruise MS, RD, LDN Registered Dietitian Clinical Nutrition RD Inpatient Contact Info in Amion

## 2023-06-20 NOTE — Progress Notes (Signed)
 PROGRESS NOTE  Derek Yates ZOX:[id number] DOB: 08/05/1940   PCP: [name], MD  Patient is from: Home.  DOA: 05/11/2023 LOS: 40  Chief complaints No chief complaint on file.    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 He was admitted to neuro ICU, eventually stabilized and transferred to the hospitalist service. Hospital course complicated by aspiration pneumonia, failure to progress, persistent dysphagia and eventually had to have a PEG tube placed on 3/18.  He was on tube feed.  Upgraded to regular diet on 3/26.  P.o. intake improved.  TF decreased to nocturnal.  Insurance denied LTACH even after appeal.  TOC and family working on SNF  Subjective: Seen and examined earlier this morning.  No major events overnight of this morning.  No complaints.  Objective: Vitals:   06/20/23 0010 06/20/23 0358 06/20/23 0500 06/20/23 0743  BP: 125/75 120/63  138/75  Pulse: 97 99  92  Resp: 17 17  16   Temp: 98 F (36.7 C) 98.5 F (36.9 C)  98.6 F (37 C)  TempSrc:  Oral    SpO2: 96% 94%  94%  Weight:   66.9 kg   Height:        Examination:  GENERAL: No apparent distress.  Nontoxic. HEENT: MMM.  Vision and hearing grossly intact.  NECK: Supple.  No apparent JVD.  RESP:  No IWOB.  Fair aeration bilaterally. CVS:  RRR. Heart sounds normal.  ABD/GI/GU: BS+. Abd soft, NTND.  G-tube in place. MSK/EXT:  Moves extremities. No apparent deformity. No edema.  SKIN: no apparent skin lesion or wound NEURO: Awake.  Oriented to self and place.  Follows commands.  PSYCH: Calm. Normal affect.   Consultants:  Neurology  Procedures: G-tube placement  Microbiology summarized: 2/23-RVP nonreactive 2/22-blood cultures NGTD 2/23 and 2/25-MRSA PCR nonreactive 2/25-GIP  negative 2/25-urine culture NGTD 2/25-respiratory culture with staph epidermis 2/25-blood cultures NGTD   Assessment and plan: ICH to the right caudate with intraventricular hemorrhage-presented with ICH, neurology as well as ICU consulted on admission.  Given underlying dementia palliative was consulted, GOC discussions were held and ongoing medical care has been established.  Due to persistent dysphagia, PEG tube placed by general surgery on 3/18.  Advanced to regular diet on 3/26 -On dysphagia 3 diet at son's request. -TF decreased to nocturnal. -Calorie count underway-p.o. intake -LTACH denied even after appeal.  Working on SNF placement.   Recurrent aspiration pneumonia: Completed antibiotic course for this.  He now has a PEG tube.  On room air. -Aspiration precaution   Acute respiratory failure with hypoxia due to aspiration pneumonia: Resolved.   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 Continue statins.  Severe dementia without behavioral disturbance/delirium: Awake and alert but only oriented to self and place.  Follows commands. -Reorientation and delirium precautions  Dysphagia: PEG tube placed on 3/18 by surgery and was on TF. Upgraded to regular diet on 3/26. -TF and diet as above.   Oral thrush -Received Diflucan from 3/16-3/24.  Also received nystatin.   Acute kidney injury-Likely hemodynamic mediated.  Resolved.   Essential hypertension: Normotensive -Continue amlodipine   Hyperlipidemia  -Continue statins.    Severe malnutrition/dysphagia Body mass index is 22.43 kg/m. Nutrition Problem: Severe Malnutrition Etiology: chronic illness Signs/Symptoms: severe fat depletion, severe muscle depletion Interventions: Tube  feeding, MVI, Prostat   DVT prophylaxis:  heparin injection 5,000 Units Start: 05/15/23 1400 SCD's Start: 05/11/23 2019  Code Status: DNR/DNI Family Communication: None at bedside today. Level of care:  Med-Surg Status is: Inpatient Remains inpatient appropriate because: SNF bed.   Final disposition: SNF  35 minutes with more than 50% spent in reviewing records, counseling patient/family and coordinating care.   Sch Meds:  Scheduled Meds:  amLODipine  10 mg Per Tube Daily   feeding supplement  237 mL Oral BID BM   feeding supplement (OSMOLITE 1.5 CAL)  400 mL Per Tube Q24H   feeding supplement (PROSource TF20)  60 mL Per Tube Daily   fiber supplement (BANATROL TF)  60 mL Per Tube BID   heparin injection (subcutaneous)  5,000 Units Subcutaneous Q12H   multivitamin with minerals  1 tablet Per Tube Daily   Continuous Infusions:   PRN Meds:.acetaminophen **OR** acetaminophen (TYLENOL) oral liquid 160 mg/5 mL **OR** acetaminophen, glycopyrrolate, guaiFENesin-dextromethorphan, naphazoline-pheniramine, mouth rinse  Antimicrobials: Anti-infectives (From admission, onward)    Start     Dose/Rate Route Frequency Ordered Stop   06/11/23 1800  fluconazole (DIFLUCAN) tablet 100 mg  Status:  Discontinued        100 mg Per Tube Every evening 06/11/23 1526 06/13/23 1052   06/08/23 1600  fluconazole (DIFLUCAN) IVPB 100 mg  Status:  Discontinued        100 mg 50 mL/hr over 60 Minutes Intravenous Every 24 hours 06/07/23 1457 06/11/23 1526   06/07/23 1545  fluconazole (DIFLUCAN) IVPB 200 mg        200 mg 100 mL/hr over 60 Minutes Intravenous  Once 06/07/23 1457 06/07/23 1812   06/07/23 0600  ceFAZolin (ANCEF) IVPB 2g/100 mL premix        2 g 200 mL/hr over 30 Minutes Intravenous  Once 06/06/23 1423 06/07/23 0940   06/02/23 2200  metroNIDAZOLE (FLAGYL) IVPB 500 mg        500 mg 100 mL/hr over 60 Minutes Intravenous Every 12 hours 06/02/23 0908 06/02/23 2246   06/02/23 1400  ceFEPIme (MAXIPIME) 2 g in sodium chloride 0.9 % 100 mL IVPB        2 g 200 mL/hr over 30 Minutes Intravenous Every 8 hours 06/02/23 0908 06/02/23 1454   05/23/23 1130  metroNIDAZOLE (FLAGYL) IVPB 500 mg  Status:   Discontinued        500 mg 100 mL/hr over 60 Minutes Intravenous Every 12 hours 05/23/23 1033 06/02/23 0908   05/22/23 1330  ceFEPIme (MAXIPIME) 2 g in sodium chloride 0.9 % 100 mL IVPB  Status:  Discontinued        2 g 200 mL/hr over 30 Minutes Intravenous Every 8 hours 05/22/23 1257 06/02/23 0908   05/20/23 1415  amoxicillin-clavulanate (AUGMENTIN) 400-57 MG/5ML suspension 500 mg  Status:  Discontinued        500 mg Per Tube Every 8 hours 05/20/23 1323 05/22/23 1257   05/20/23 1000  amoxicillin-clavulanate (AUGMENTIN) 875-125 MG per tablet 1 tablet  Status:  Discontinued        1 tablet Oral Every 12 hours 05/20/23 0716 05/20/23 0723   05/20/23 0815  amoxicillin-clavulanate (AUGMENTIN) 250-62.5 MG/5ML suspension 500 mg  Status:  Discontinued        500 mg Per Tube Every 8 hours 05/20/23 0723 05/20/23 1323   05/17/23 1400  metroNIDAZOLE (FLAGYL) IVPB 500 mg  Status:  Discontinued        500 mg 100 mL/hr over 60 Minutes  Intravenous Every 12 hours 05/17/23 1322 05/20/23 0716   05/17/23 1130  linezolid (ZYVOX) IVPB 600 mg  Status:  Discontinued        600 mg 300 mL/hr over 60 Minutes Intravenous Every 12 hours 05/17/23 1043 05/17/23 1322   05/16/23 1100  ceFEPIme (MAXIPIME) 2 g in sodium chloride 0.9 % 100 mL IVPB  Status:  Discontinued        2 g 200 mL/hr over 30 Minutes Intravenous Every 12 hours 05/16/23 1012 05/20/23 0716   05/15/23 1400  cefTRIAXone (ROCEPHIN) 2 g in sodium chloride 0.9 % 100 mL IVPB  Status:  Discontinued        2 g 200 mL/hr over 30 Minutes Intravenous Every 24 hours 05/15/23 1313 05/16/23 1012   05/15/23 1345  cefTRIAXone (ROCEPHIN) 1 g in sodium chloride 0.9 % 100 mL IVPB  Status:  Discontinued        1 g 200 mL/hr over 30 Minutes Intravenous Every 24 hours 05/15/23 1254 05/15/23 1313   05/11/23 2230  cefTRIAXone (ROCEPHIN) 1 g in sodium chloride 0.9 % 100 mL IVPB        1 g 200 mL/hr over 30 Minutes Intravenous Daily at bedtime 05/11/23 2153 05/12/23 2209         I have personally reviewed the following labs and images: CBC: No results for input(s): "WBC", "NEUTROABS", "HGB", "HCT", "MCV", "PLT" in the last 168 hours.  BMP &GFR No results for input(s): "NA", "K", "CL", "CO2", "GLUCOSE", "BUN", "CREATININE", "CALCIUM", "MG", "PHOS" in the last 168 hours.  Invalid input(s): "GFRCG"  Estimated Creatinine Clearance: 63.9 mL/min (A) (by C-G formula based on SCr of 0.59 mg/dL (L)). Liver & Pancreas: No results for input(s): "AST", "ALT", "ALKPHOS", "BILITOT", "PROT", "ALBUMIN" in the last 168 hours.  No results for input(s): "LIPASE", "AMYLASE" in the last 168 hours. No results for input(s): "AMMONIA" in the last 168 hours. Diabetic: No results for input(s): "HGBA1C" in the last 72 hours. Recent Labs  Lab 06/19/23 1953 06/20/23 0011 06/20/23 0359 06/20/23 0745 06/20/23 1144  GLUCAP 125* 127* 128* 134* 172*   Cardiac Enzymes: No results for input(s): "CKTOTAL", "CKMB", "CKMBINDEX", "TROPONINI" in the last 168 hours. No results for input(s): "PROBNP" in the last 8760 hours. Coagulation Profile: No results for input(s): "INR", "PROTIME" in the last 168 hours. Thyroid Function Tests: No results for input(s): "TSH", "T4TOTAL", "FREET4", "T3FREE", "THYROIDAB" in the last 72 hours. Lipid Profile: No results for input(s): "CHOL", "HDL", "LDLCALC", "TRIG", "CHOLHDL", "LDLDIRECT" in the last 72 hours. Anemia Panel: No results for input(s): "VITAMINB12", "FOLATE", "FERRITIN", "TIBC", "IRON", "RETICCTPCT" in the last 72 hours. Urine analysis:    Component Value Date/Time   COLORURINE YELLOW 05/17/2023 1430   APPEARANCEUR CLEAR 05/17/2023 1430   APPEARANCEUR Clear 07/23/2021 1535   LABSPEC 1.026 05/17/2023 1430   PHURINE 5.0 05/17/2023 1430   GLUCOSEU NEGATIVE 05/17/2023 1430   HGBUR NEGATIVE 05/17/2023 1430   BILIRUBINUR NEGATIVE 05/17/2023 1430   BILIRUBINUR 1+ 05/04/2023 1015   BILIRUBINUR Negative 07/23/2021 1535   KETONESUR  NEGATIVE 05/17/2023 1430   PROTEINUR NEGATIVE 05/17/2023 1430   UROBILINOGEN 0.2 (A) 05/04/2023 1015   NITRITE NEGATIVE 05/17/2023 1430   LEUKOCYTESUR NEGATIVE 05/17/2023 1430   Sepsis Labs: Invalid input(s): "PROCALCITONIN", "LACTICIDVEN"  Microbiology: No results found for this or any previous visit (from the past 240 hours).  Radiology Studies: No results found.    Derek Yates [facility]  If 7PM-7AM, please contact night-coverage [email or web address] 06/20/2023, 2:54 PM

## 2023-06-20 NOTE — Progress Notes (Signed)
 Occupational Therapy Treatment Patient Details Name: Derek Yates MRN: [id number] DOB: December 21, 1940 Today's Date: 06/20/2023   History of present illness 83 yo male presents to [facility] on 2/19 as code stroke for speech deficits, L weakness. 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 PEG tube placement on 06/07/2023. PMH includes dementia, recurrent UTIs, CVA, HTN.   OT comments  Patient finally with improved LOA this session.  Patient awake and able to follow commands with repetition, increased time and tactile assist.  Patient able to come to EOB with Mod A, but still needing Total assist to transfer to the recliner.  Patient continues to push back, so patient will need to work on leaning forward to initiate transfers.  Patient is able to participate with light grooming tasks seated.  OT will continue efforts in the acute setting to address deficits, and Patient will benefit from continued inpatient follow up therapy, <3 hours/day.      If plan is discharge home, recommend the following:  Two people to help with walking and/or transfers;Two people to help with bathing/dressing/bathroom;Direct supervision/assist for medications management;Direct supervision/assist for financial management;Assistance with cooking/housework;Assistance with feeding;Assist for transportation;Supervision due to cognitive status   Equipment Recommendations  Wheelchair cushion (measurements OT);Wheelchair (measurements OT)    Recommendations for Other Services      Precautions / Restrictions Precautions Precaution/Restrictions Comments: PEG tube; abdominal binder, Restrictions Weight Bearing Restrictions Per Provider Order: No       Mobility Bed Mobility Overal bed mobility: Needs Assistance Bed Mobility: Supine to Sit     Supine to sit: Mod assist, Max assist          Transfers Overall transfer level: Needs assistance   Transfers: Sit to/from Stand, Bed to  chair/wheelchair/BSC Sit to Stand: Total assist   Squat pivot transfers: Total assist             Balance Overall balance assessment: Needs assistance Sitting-balance support: Bilateral upper extremity supported, Feet supported Sitting balance-Leahy Scale: Fair   Postural control: Posterior lean   Standing balance-Leahy Scale: Zero                             ADL either performed or assessed with clinical judgement   ADL   Eating/Feeding: Moderate assistance;Sitting   Grooming: Moderate assistance;Sitting   Upper Body Bathing: Maximal assistance;Sitting   Lower Body Bathing: Total assistance;Bed level   Upper Body Dressing : Sitting;Maximal assistance   Lower Body Dressing: Bed level;Maximal assistance   Toilet Transfer: Total assistance;Squat-pivot;BSC/3in1   Toileting- Clothing Manipulation and Hygiene: Total assistance;Bed level              Extremity/Trunk Assessment Upper Extremity Assessment Upper Extremity Assessment: Generalized weakness RUE Deficits / Details: Improved AROM with increased LOA.  Able to participate with light grooming task LUE Deficits / Details: Improved AROM with increased LOA. Able to participate with light grooming task   Lower Extremity Assessment Lower Extremity Assessment: Defer to PT evaluation   Cervical / Trunk Assessment Cervical / Trunk Assessment: Kyphotic    Vision   Vision Assessment?: No apparent visual deficits   Perception     Praxis     Communication Communication Communication: Impaired Factors Affecting Communication: Reduced clarity of speech   Cognition Arousal: Alert Behavior During Therapy: Restless, Anxious, Flat affect Cognition: History of cognitive impairments, Cognition impaired   Orientation impairments: Place, Time, Situation Awareness: Intellectual awareness impaired, Online awareness  impaired   Attention impairment (select first level of impairment): Sustained  attention Executive functioning impairment (select all impairments): Sequencing                   Following commands: Impaired Following commands impaired: Follows one step commands inconsistently, Follows one step commands with increased time      Cueing   Cueing Techniques: Verbal cues, Gestural cues, Tactile cues  Exercises      Shoulder Instructions       General Comments      Pertinent Vitals/ Pain       Pain Assessment Pain Assessment: Faces Faces Pain Scale: Hurts a little bit Pain Location: generalized with movement Pain Descriptors / Indicators: Grimacing Pain Intervention(s): Monitored during session                                                          Frequency  Min 2X/week        Progress Toward Goals  OT Goals(current goals can now be found in the care plan section)  Progress towards OT goals: Goals updated  Acute Rehab OT Goals OT Goal Formulation: Patient unable to participate in goal setting Time For Goal Achievement: 07/04/23 Potential to Achieve Goals: Fair ADL Goals Pt Will Perform Grooming: with set-up;sitting Pt Will Perform Upper Body Bathing: with min assist;sitting Pt Will Perform Upper Body Dressing: with min assist;sitting Pt Will Transfer to Toilet: with mod assist;stand pivot transfer;bedside commode  Plan      Co-evaluation                 AM-PAC OT "6 Clicks" Daily Activity     Outcome Measure   Help from another person eating meals?: A Lot Help from another person taking care of personal grooming?: A Lot Help from another person toileting, which includes using toliet, bedpan, or urinal?: A Lot Help from another person bathing (including washing, rinsing, drying)?: A Lot Help from another person to put on and taking off regular upper body clothing?: A Lot Help from another person to put on and taking off regular lower body clothing?: A Lot 6 Click Score: 12    End of Session  Equipment Utilized During Treatment: Gait belt  OT Visit Diagnosis: Unsteadiness on feet (R26.81);Other abnormalities of gait and mobility (R26.89);Muscle weakness (generalized) (M62.81);Other symptoms and signs involving cognitive function;Hemiplegia and hemiparesis   Activity Tolerance Patient tolerated treatment well   Patient Left in chair;with call bell/phone within reach;with chair alarm set;with family/visitor present   Nurse Communication Mobility status        Time: 5784-6962 OT Time Calculation (min): 23 min  Charges: OT General Charges $OT Visit: 1 Visit OT Treatments $Self Care/Home Management : 23-37 mins  06/20/2023  RP, OTR/L  Acute Rehabilitation Services  Office:  [phone]   Suzanna Obey 06/20/2023, 2:46 PM

## 2023-06-20 NOTE — TOC Progression Note (Signed)
 Transition of Care [facility]) - Progression Note    Patient Details  Name: Derek Yates MRN: [id number] Date of Birth: [birth year]  Transition of Care [facility]) CM/SW Contact  [name], [place] Phone Number: 06/20/2023, 2:52 PM  Clinical Narrative:   CSW received update from son that family toured [facility] over the weekend and it may be too far for the spouse to visit, asking for [place] options. CSW sent referral again to [facility], they initially declined but now are able to offer a bed. CSW also requested [facility] to review, referral is pending; awaiting response. Family to tour [facility] in the morning. CSW received update from [facility] that they are out of network with patient's insurance. CSW relayed information to patient's son. CSW to follow.    Expected Discharge Plan: Skilled Nursing Facility Barriers to Discharge: English as a second language teacher, Continued Medical Work up  Expected Discharge Plan and Services   Discharge Planning Services: CM Consult Post Acute Care Choice: Hospice Living arrangements for the past 2 months: Single Family Home                             [facility] Agency: Hospice and Palliative Care of  Date [facility] Agency Contacted: 05/31/23   Representative spoke with at [facility] Agency: Efraim Kaufmann   Social Determinants of Health (SDOH) Interventions SDOH Screenings   Food Insecurity: Patient Unable To Answer (05/13/2023)  Housing: Patient Unable To Answer (05/13/2023)  Transportation Needs: Patient Unable To Answer (05/13/2023)  Utilities: Patient Unable To Answer (05/13/2023)  Depression (PHQ2-9): Medium Risk (02/10/2023)  Financial Resource Strain: Low Risk  (06/20/2021)   Received from [facility], [facility]  Social Connections: Patient Unable To Answer (05/13/2023)  Tobacco Use: Low Risk  (06/07/2023)    Readmission Risk Interventions     No data to display

## 2023-06-20 NOTE — Plan of Care (Signed)
   Problem: Clinical Measurements: Goal: Will remain free from infection Outcome: Progressing   Problem: Education: Goal: Knowledge of General Education information will improve Description: Including pain rating scale, medication(s)/side effects and non-pharmacologic comfort measures Outcome: Not Progressing   Problem: Health Behavior/Discharge Planning: Goal: Ability to manage health-related needs will improve Outcome: Not Progressing

## 2023-06-21 DIAGNOSIS — E43 Unspecified severe protein-calorie malnutrition: Secondary | ICD-10-CM | POA: Diagnosis not present

## 2023-06-21 DIAGNOSIS — I629 Nontraumatic intracranial hemorrhage, unspecified: Secondary | ICD-10-CM | POA: Diagnosis not present

## 2023-06-21 DIAGNOSIS — I6389 Other cerebral infarction: Secondary | ICD-10-CM | POA: Diagnosis not present

## 2023-06-21 DIAGNOSIS — I610 Nontraumatic intracerebral hemorrhage in hemisphere, subcortical: Secondary | ICD-10-CM | POA: Diagnosis not present

## 2023-06-21 LAB — GLUCOSE, CAPILLARY
Glucose-Capillary: 116 mg/dL — ABNORMAL HIGH (ref 70–99)
Glucose-Capillary: 173 mg/dL — ABNORMAL HIGH (ref 70–99)
Glucose-Capillary: 149 mg/dL — ABNORMAL HIGH (ref 70–99)
Glucose-Capillary: 169 mg/dL — ABNORMAL HIGH (ref 70–99)

## 2023-06-21 NOTE — Plan of Care (Signed)

## 2023-06-21 NOTE — TOC Progression Note (Signed)
 Transition of Care [facility]) - Progression Note    Patient Details  Name: Derek Yates MRN: [id number] Date of Birth: 1940/12/24  Transition of Care [facility]) CM/SW Contact  [name], [place] Phone Number: 06/21/2023, 9:48 AM  Clinical Narrative:   CSW following for transition to SNF. CSW received message from son after hours asking to send referral to [facility], and received call this morning from [facility] that family had reached out for a tour. Both [facility] and [facility] had previously declined, but CSW sent referral again for review. Noting patient transferred to another unit overnight, CSW called CSW on 2W to provide hand off on placement progress. CSW updated son, Beckey Downing, that patient had transferred and provided contact information for new CSW.    Expected Discharge Plan: Skilled Nursing Facility Barriers to Discharge: English as a second language teacher, Continued Medical Work up  Expected Discharge Plan and Services   Discharge Planning Services: CM Consult Post Acute Care Choice: Hospice Living arrangements for the past 2 months: Single Family Home                             [facility] Agency: [facility] Date [facility] Agency Contacted: 05/31/23   Representative spoke with at [facility] Agency: Efraim Kaufmann   Social Determinants of Health (SDOH) Interventions SDOH Screenings   Food Insecurity: Patient Unable To Answer (05/13/2023)  Housing: Patient Unable To Answer (05/13/2023)  Transportation Needs: Patient Unable To Answer (05/13/2023)  Utilities: Patient Unable To Answer (05/13/2023)  Depression (PHQ2-9): Medium Risk (02/10/2023)  Financial Resource Strain: Low Risk  (06/20/2021)   Received from [facility], [facility]  Social Connections: Patient Unable To Answer (05/13/2023)  Tobacco Use: Low Risk  (06/07/2023)    Readmission Risk Interventions     No data to display

## 2023-06-21 NOTE — Progress Notes (Signed)
 Physical Therapy Treatment Patient Details Name: Derek Yates MRN: [id number] DOB: 1940-12-31 Today's Date: 06/21/2023   History of Present Illness 83 yo male presents to [facility] on 2/19 as code stroke for speech deficits, L weakness. 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 PEG tube placement on 06/07/2023. PMH includes dementia, recurrent UTIs, CVA, HTN.    PT Comments  Pt received in supine, sleeping but easily awoken to verbal/tactile cueing and with good participation as able with multimodal cues. Pt appears A&O x1 and with good effort for sit<>stand x3 trials to [facility], pt achieves ~50% upright at most then c/o fatigue after third trial. Had planned to transfer OOB to chair however mechanical lift in use in another room and maxi-sky lift had not been charged up (therapist and mobility moved it on to charger during session) so plan to return later to lift him OOB to chair via hoyer. Pt with good effort for seated BLE exercises and BUE reaching and balance tasks on EOB, tolerated EOB ~15 minutes prior to c/o fatigue. Patient will benefit from continued inpatient follow up therapy, <3 hours/day.   If plan is discharge home, recommend the following: Two people to help with walking and/or transfers;Two people to help with bathing/dressing/bathroom;A lot of help with walking and/or transfers;Assistance with cooking/housework;Assistance with feeding;Assist for transportation;Help with stairs or ramp for entrance   Can travel by private vehicle     No  Equipment Recommendations  Wheelchair (measurements PT);Wheelchair cushion (measurements PT);Hospital bed;Hoyer lift    Recommendations for Other Services       Precautions / Restrictions Precautions Precautions: Fall Recall of Precautions/Restrictions: Impaired Precaution/Restrictions Comments: PEG tube; abdominal binder Restrictions Weight Bearing Restrictions Per Provider Order: No     Mobility  Bed  Mobility Overal bed mobility: Needs Assistance Bed Mobility: Rolling, Sidelying to Sit, Sit to Sidelying Rolling: Total assist, +2 for physical assistance, Used rails Sidelying to sit: Max assist, +2 for physical assistance, HOB elevated, Used rails     Sit to sidelying: Max assist, +2 for physical assistance, Used rails General bed mobility comments: Pt with good initiation moving BLE towards EOB with consistent verbal/tactile cues. Pt required assistance facilitating trunk to upright posture (pt attempting to push up away from bed rail to lift trunk) and scooting forward until B feet were flat on the floor. Pt with increased resistance when rolling L<>R in bed.    Transfers Overall transfer level: Needs assistance Equipment used: Ambulation equipment used Transfers: Sit to/from Stand, Bed to chair/wheelchair/BSC Sit to Stand: Max assist, +2 physical assistance, Total assist, From elevated surface, Via lift equipment           General transfer comment: Attempted 3 sit to stand today from elevated bed<>Stedy, pt maintains knees flexed and only lifts his hips off bed ~3-4" prior to freezing in squatting type posture, despite bed pad assist/gait belt assist under his armpits. Unable to stand high enough/extend hips enough for placement of Stedy pads. Transfer via Lift Equipment: Stedy  Ambulation/Gait               General Gait Details: unable at this time   Comptroller Bed    Modified Rankin (Stroke Patients Only) Modified Rankin (Stroke Patients Only) Pre-Morbid Rankin Score: Slight disability Modified Rankin: Severe disability     Balance Overall balance assessment: Needs assistance Sitting-balance support: Bilateral  upper extremity supported, Feet supported Sitting balance-Leahy Scale: Poor Sitting balance - Comments: Pt observed leaning to L side and requiring MaxA to maintain balance while seated EOB when unsupported;  with RUE on end of bed rail, pt needing minA for trunk support. Postural control: Left lateral lean Standing balance support: Bilateral upper extremity supported, Reliant on assistive device for balance Standing balance-Leahy Scale: Zero Standing balance comment: Pt reliant on stedy to maintain support and only lifting hips 3-4" up off bed with +2 maxA, unable to achieve full upright posture                            Communication Communication Communication: Impaired Factors Affecting Communication: Reduced clarity of speech  Cognition Arousal: Alert (sleeping initially but once awoken, pt alert) Behavior During Therapy: Restless, Anxious, Flat affect   PT - Cognitive impairments: Attention, Initiation, Sequencing, Safety/Judgement, Memory, Problem solving, Orientation, Awareness   Orientation impairments: Place, Time, Situation                   PT - Cognition Comments: Pt following simple motor commands with multimodal cues and increased time, but internally distracted and pt verbalizes fear of falls Following commands: Impaired Following commands impaired: Follows one step commands with increased time    Cueing Cueing Techniques: Verbal cues, Gestural cues, Tactile cues, Visual cues  Exercises General Exercises - Lower Extremity Long Arc Quad: AROM, Both, Seated, AAROM, 10 reps Hip ABduction/ADduction: AAROM, Both, 5 reps, Supine Hip Flexion/Marching: AROM, 10 reps, Seated, Both Other Exercises Other Exercises: seated BUE AROM: shoulder flexion /reaching over his head x5 reps ea side, then bil reaching x1 rep overhead (CGA for safety)    General Comments General comments (skin integrity, edema, etc.): Reviewed with RN, pt will need hoyer for OOB at this time as he was unable to stand upright in [place] lift      Pertinent Vitals/Pain Pain Assessment Pain Assessment: Faces Faces Pain Scale: Hurts a little bit Pain Location: Generalized with movement Pain  Descriptors / Indicators: Grimacing, Guarding Pain Intervention(s): Limited activity within patient's tolerance, Monitored during session, Repositioned, Other (comment) (pillow placed for comfort)     PT Goals (current goals can now be found in the care plan section) Acute Rehab PT Goals PT Goal Formulation: With patient/family Time For Goal Achievement: 06/24/23 Progress towards PT goals: Progressing toward goals    Frequency    Min 2X/week      PT Plan         AM-PAC PT "6 Clicks" Mobility   Outcome Measure  Help needed turning from your back to your side while in a flat bed without using bedrails?: Total Help needed moving from lying on your back to sitting on the side of a flat bed without using bedrails?: Total Help needed moving to and from a bed to a chair (including a wheelchair)?: Total Help needed standing up from a chair using your arms (e.g., wheelchair or bedside chair)?: Total Help needed to walk in hospital room?: Total Help needed climbing 3-5 steps with a railing? : Total 6 Click Score: 6    End of Session Equipment Utilized During Treatment: Gait belt;Other (comment) (abdominal binder already donned) Activity Tolerance: Patient tolerated treatment well Patient left: in bed;with call bell/phone within reach;with bed alarm set;with family/visitor present;Other (comment) (spouse in room) Nurse Communication: Mobility status;Need for lift equipment PT Visit Diagnosis: Other abnormalities of gait and mobility (R26.89);Muscle weakness (generalized) (  M62.81)     Time: 1330-1400 PT Time Calculation (min) (ACUTE ONLY): 30 min  Charges:    $Therapeutic Exercise: 8-22 mins $Therapeutic Activity: 8-22 mins PT General Charges $$ ACUTE PT VISIT: 1 Visit                     Kenady Doxtater P., PTA Acute Rehabilitation Services Secure Chat Preferred 9a-5:30pm Office: [phone]    Dorathy Kinsman [facility] 06/21/2023, 3:47 PM

## 2023-06-21 NOTE — Progress Notes (Signed)
 PROGRESS NOTE  Derek Yates UEA:[id number] DOB: [birth year]   PCP: [name], MD  Patient is from: Home.  DOA: 05/11/2023 LOS: 41  Chief complaints No chief complaint on file.    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 He was admitted to neuro ICU, eventually stabilized and transferred to the hospitalist service. Hospital course complicated by aspiration pneumonia, failure to progress, persistent dysphagia and eventually had to have a PEG tube placed on 3/18.  He was on tube feed.  Upgraded to regular diet on 3/26.  P.o. intake improved.  TF changed to as needed based on p.o. intake.  Insurance denied LTACH even after appeal.  TOC and family working on SNF.  Medically stable for discharge.  Subjective: Seen and examined earlier this morning.  No major events overnight of this morning.  No complaints but not a great historian.  [name] wife at bedside.  Objective: Vitals:   06/20/23 2056 06/21/23 0433 06/21/23 0926 06/21/23 1248  BP: 119/73 (!) 148/77 124/71 115/66  Pulse: (!) 101 100 100 97  Resp: 18 18 17 17   Temp: 98.7 F (37.1 C) 98 F (36.7 C) 97.6 F (36.4 C) 98.4 F (36.9 C)  TempSrc: Oral Oral    SpO2: 96% 95% 96% 95%  Weight:  64.1 kg    Height:        Examination:  GENERAL: No apparent distress.  Nontoxic. HEENT: MMM.  Vision and hearing grossly intact.  NECK: Supple.  No apparent JVD.  RESP:  No IWOB.  Fair aeration bilaterally. CVS:  RRR. Heart sounds normal.  ABD/GI/GU: BS+. Abd soft, NTND.  G-tube in place. MSK/EXT:  Moves extremities. No apparent deformity. No edema.  SKIN: no apparent skin lesion or wound NEURO: Awake.  Oriented to self, place and person.  Follows commands.  PSYCH: Calm. Normal affect.   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  Given underlying dementia palliative was consulted, GOC discussions were held and ongoing medical care has been established.  Due to persistent dysphagia, PEG tube placed by general surgery on 3/18.  Advanced to regular diet on 3/26 -On vegetarian diet -TF changed to as needed -LTACH denied even after appeal.  Working on SNF placement.   Recurrent aspiration pneumonia: Completed antibiotic course for this.  He now has a PEG tube.  On room air. -Aspiration precaution   Acute respiratory failure with hypoxia due to aspiration pneumonia: Resolved.   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 Continue statins.  Severe dementia without behavioral disturbance/delirium: Awake and alert but only oriented to self and place.  Follows commands. -Reorientation and delirium precautions  Dysphagia: PEG tube placed on 3/18 by surgery and was on TF. Upgraded to regular diet on 3/26. -TF and diet as above.   Oral thrush -Received Diflucan from 3/16-3/24.  Also received nystatin.   Acute kidney injury-Likely hemodynamic mediated.  Resolved.   Essential hypertension: Normotensive -Continue amlodipine   Hyperlipidemia  -Continue statins.    Severe malnutrition/dysphagia Body mass index  is 21.49 kg/m. Nutrition Problem: Severe Malnutrition Etiology: chronic illness Signs/Symptoms: severe fat depletion, severe muscle depletion Interventions: Tube feeding, MVI, Prostat   DVT prophylaxis:  heparin injection 5,000 Units Start: 05/15/23 1400 SCD's Start:  05/11/23 2019  Code Status: DNR/DNI Family Communication: Updated patient's wife at bedside. Level of care: Med-Surg Status is: Inpatient Remains inpatient appropriate because: SNF bed.   Final disposition: SNF  35 minutes with more than 50% spent in reviewing records, counseling patient/family and coordinating care.   Sch Meds:  Scheduled Meds:  amLODipine  10 mg Per Tube Daily   feeding supplement  237 mL Oral BID BM   feeding supplement (OSMOLITE 1.5 CAL)  237 mL Per Tube TID with meals   feeding supplement (PROSource TF20)  60 mL Per Tube Daily   free water  200 mL Per Tube QID   heparin injection (subcutaneous)  5,000 Units Subcutaneous Q12H   multivitamin with minerals  1 tablet Per Tube Daily   Continuous Infusions:   PRN Meds:.acetaminophen **OR** acetaminophen (TYLENOL) oral liquid 160 mg/5 mL **OR** acetaminophen, glycopyrrolate, guaiFENesin-dextromethorphan, naphazoline-pheniramine, mouth rinse  Antimicrobials: Anti-infectives (From admission, onward)    Start     Dose/Rate Route Frequency Ordered Stop   06/11/23 1800  fluconazole (DIFLUCAN) tablet 100 mg  Status:  Discontinued        100 mg Per Tube Every evening 06/11/23 1526 06/13/23 1052   06/08/23 1600  fluconazole (DIFLUCAN) IVPB 100 mg  Status:  Discontinued        100 mg 50 mL/hr over 60 Minutes Intravenous Every 24 hours 06/07/23 1457 06/11/23 1526   06/07/23 1545  fluconazole (DIFLUCAN) IVPB 200 mg        200 mg 100 mL/hr over 60 Minutes Intravenous  Once 06/07/23 1457 06/07/23 1812   06/07/23 0600  ceFAZolin (ANCEF) IVPB 2g/100 mL premix        2 g 200 mL/hr over 30 Minutes Intravenous  Once 06/06/23 1423 06/07/23 0940   06/02/23 2200  metroNIDAZOLE (FLAGYL) IVPB 500 mg        500 mg 100 mL/hr over 60 Minutes Intravenous Every 12 hours 06/02/23 0908 06/02/23 2246   06/02/23 1400  ceFEPIme (MAXIPIME) 2 g in sodium chloride 0.9 % 100 mL IVPB        2 g 200 mL/hr over 30 Minutes Intravenous Every 8  hours 06/02/23 0908 06/02/23 1454   05/23/23 1130  metroNIDAZOLE (FLAGYL) IVPB 500 mg  Status:  Discontinued        500 mg 100 mL/hr over 60 Minutes Intravenous Every 12 hours 05/23/23 1033 06/02/23 0908   05/22/23 1330  ceFEPIme (MAXIPIME) 2 g in sodium chloride 0.9 % 100 mL IVPB  Status:  Discontinued        2 g 200 mL/hr over 30 Minutes Intravenous Every 8 hours 05/22/23 1257 06/02/23 0908   05/20/23 1415  amoxicillin-clavulanate (AUGMENTIN) 400-57 MG/5ML suspension 500 mg  Status:  Discontinued        500 mg Per Tube Every 8 hours 05/20/23 1323 05/22/23 1257   05/20/23 1000  amoxicillin-clavulanate (AUGMENTIN) 875-125 MG per tablet 1 tablet  Status:  Discontinued        1 tablet Oral Every 12 hours 05/20/23 0716 05/20/23 0723   05/20/23 0815  amoxicillin-clavulanate (AUGMENTIN) 250-62.5 MG/5ML suspension 500 mg  Status:  Discontinued        500 mg Per Tube Every 8 hours 05/20/23 0723 05/20/23 1323   05/17/23 1400  metroNIDAZOLE (FLAGYL) IVPB  500 mg  Status:  Discontinued        500 mg 100 mL/hr over 60 Minutes Intravenous Every 12 hours 05/17/23 1322 05/20/23 0716   05/17/23 1130  linezolid (ZYVOX) IVPB 600 mg  Status:  Discontinued        600 mg 300 mL/hr over 60 Minutes Intravenous Every 12 hours 05/17/23 1043 05/17/23 1322   05/16/23 1100  ceFEPIme (MAXIPIME) 2 g in sodium chloride 0.9 % 100 mL IVPB  Status:  Discontinued        2 g 200 mL/hr over 30 Minutes Intravenous Every 12 hours 05/16/23 1012 05/20/23 0716   05/15/23 1400  cefTRIAXone (ROCEPHIN) 2 g in sodium chloride 0.9 % 100 mL IVPB  Status:  Discontinued        2 g 200 mL/hr over 30 Minutes Intravenous Every 24 hours 05/15/23 1313 05/16/23 1012   05/15/23 1345  cefTRIAXone (ROCEPHIN) 1 g in sodium chloride 0.9 % 100 mL IVPB  Status:  Discontinued        1 g 200 mL/hr over 30 Minutes Intravenous Every 24 hours 05/15/23 1254 05/15/23 1313   05/11/23 2230  cefTRIAXone (ROCEPHIN) 1 g in sodium chloride 0.9 % 100 mL IVPB         1 g 200 mL/hr over 30 Minutes Intravenous Daily at bedtime 05/11/23 2153 05/12/23 2209        I have personally reviewed the following labs and images: CBC: No results for input(s): "WBC", "NEUTROABS", "HGB", "HCT", "MCV", "PLT" in the last 168 hours.  BMP &GFR No results for input(s): "NA", "K", "CL", "CO2", "GLUCOSE", "BUN", "CREATININE", "CALCIUM", "MG", "PHOS" in the last 168 hours.  Invalid input(s): "GFRCG"  Estimated Creatinine Clearance: 61.2 mL/min (A) (by C-G formula based on SCr of 0.59 mg/dL (L)). Liver & Pancreas: No results for input(s): "AST", "ALT", "ALKPHOS", "BILITOT", "PROT", "ALBUMIN" in the last 168 hours.  No results for input(s): "LIPASE", "AMYLASE" in the last 168 hours. No results for input(s): "AMMONIA" in the last 168 hours. Diabetic: No results for input(s): "HGBA1C" in the last 72 hours. Recent Labs  Lab 06/20/23 2058 06/20/23 2341 06/21/23 0434 06/21/23 1007 06/21/23 1319  GLUCAP 110* 130* 116* 173* 149*   Cardiac Enzymes: No results for input(s): "CKTOTAL", "CKMB", "CKMBINDEX", "TROPONINI" in the last 168 hours. No results for input(s): "PROBNP" in the last 8760 hours. Coagulation Profile: No results for input(s): "INR", "PROTIME" in the last 168 hours. Thyroid Function Tests: No results for input(s): "TSH", "T4TOTAL", "FREET4", "T3FREE", "THYROIDAB" in the last 72 hours. Lipid Profile: No results for input(s): "CHOL", "HDL", "LDLCALC", "TRIG", "CHOLHDL", "LDLDIRECT" in the last 72 hours. Anemia Panel: No results for input(s): "VITAMINB12", "FOLATE", "FERRITIN", "TIBC", "IRON", "RETICCTPCT" in the last 72 hours. Urine analysis:    Component Value Date/Time   COLORURINE YELLOW 05/17/2023 1430   APPEARANCEUR CLEAR 05/17/2023 1430   APPEARANCEUR Clear 07/23/2021 1535   LABSPEC 1.026 05/17/2023 1430   PHURINE 5.0 05/17/2023 1430   GLUCOSEU NEGATIVE 05/17/2023 1430   HGBUR NEGATIVE 05/17/2023 1430   BILIRUBINUR NEGATIVE 05/17/2023  1430   BILIRUBINUR 1+ 05/04/2023 1015   BILIRUBINUR Negative 07/23/2021 1535   KETONESUR NEGATIVE 05/17/2023 1430   PROTEINUR NEGATIVE 05/17/2023 1430   UROBILINOGEN 0.2 (A) 05/04/2023 1015   NITRITE NEGATIVE 05/17/2023 1430   LEUKOCYTESUR NEGATIVE 05/17/2023 1430   Sepsis Labs: Invalid input(s): "PROCALCITONIN", "LACTICIDVEN"  Microbiology: No results found for this or any previous visit (from the past 240 hours).  Radiology Studies: No results  found.    Rosmarie Esquibel T. Florencia Zaccaro [facility]  If 7PM-7AM, please contact night-coverage [email or web address] 06/21/2023, 1:42 PM

## 2023-06-21 NOTE — Progress Notes (Signed)
 Physical Therapy Treatment Patient Details Name: Derek Yates MRN: [id number] DOB: Aug 14, 1940 Today's Date: 06/21/2023   History of Present Illness 83 yo male presents to [facility] on 2/19 as code stroke for speech deficits, L weakness. 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 PEG tube placement on 06/07/2023. PMH includes dementia, recurrent UTIs, CVA, HTN.    PT Comments  Pt received in supine, agreeable to additional therapy session as previously discussed with pt/spouse for transfer OOB to chair now that mechanical lift is available, as pt lunch is soon to arrive and pt needs to be more upright for improved pulmonary clearance/digestion and endurance building. Pt performs rolling to L/R sides with up to +2 totalA with difficulty sequencing despite multimodal cues. Pt up in chair with alarm of for safety, RN aware of need for hoyer lift for return transfer and safe technique, spouse present and agreeable to assist pt with using call bell after he sits up for 1-2 hours for return transfer. Patient will benefit from continued inpatient follow up therapy, <3 hours/day.     If plan is discharge home, recommend the following: Two people to help with walking and/or transfers;Two people to help with bathing/dressing/bathroom;A lot of help with walking and/or transfers;Assistance with cooking/housework;Assistance with feeding;Assist for transportation;Help with stairs or ramp for entrance   Can travel by private vehicle     No  Equipment Recommendations  Wheelchair (measurements PT);Wheelchair cushion (measurements PT);Hospital bed;Hoyer lift    Recommendations for Other Services       Precautions / Restrictions Precautions Precautions: Fall Recall of Precautions/Restrictions: Impaired Precaution/Restrictions Comments: PEG tube; abdominal binder Restrictions Weight Bearing Restrictions Per Provider Order: No     Mobility  Bed Mobility Overal bed mobility: Needs  Assistance Bed Mobility: Rolling, Sidelying to Sit, Sit to Sidelying Rolling: +2 for physical assistance, Used rails, Max assist, Total assist Sidelying to sit: Max assist, +2 for physical assistance, HOB elevated, Used rails     Sit to sidelying: Max assist, +2 for physical assistance, Used rails General bed mobility comments: L/R rolling for placement of hoyer lift pad; difficult for pt to initiate but able to maintain BUE on rails to maintain sidelying each side    Transfers Overall transfer level: Needs assistance Equipment used: Ambulation equipment used Transfers: Bed to chair/wheelchair/BSC Sit to Stand: Max assist, +2 physical assistance, Total assist, From elevated surface, Via lift equipment           General transfer comment: EOB>recliner via Writer via Lift Equipment: Maximove  Ambulation/Gait               General Gait Details: unable at this time   Optometrist     Tilt Bed    Modified Rankin (Stroke Patients Only) Modified Rankin (Stroke Patients Only) Pre-Morbid Rankin Score: Slight disability Modified Rankin: Severe disability     Balance Overall balance assessment: Needs assistance Sitting-balance support: Bilateral upper extremity supported, Feet supported Sitting balance-Leahy Scale: Poor Sitting balance - Comments: Pt observed leaning to L side and requiring MaxA to maintain balance while seated EOB when unsupported; with RUE on end of bed rail, pt needing minA for trunk support. Postural control: Left lateral lean Standing balance support: Bilateral upper extremity supported, Reliant on assistive device for balance Standing balance-Leahy Scale: Zero Standing balance comment: defer  Communication Communication Communication: Impaired Factors Affecting Communication: Reduced clarity of speech  Cognition Arousal: Alert (sleeping initially but once awoken,  pt alert) Behavior During Therapy: Restless, Anxious, Flat affect   PT - Cognitive impairments: Attention, Initiation, Sequencing, Safety/Judgement, Memory, Problem solving, Orientation, Awareness   Orientation impairments: Place, Time, Situation                   PT - Cognition Comments: Pt following simple motor commands with multimodal cues and increased time, but internally distracted and pt verbalizes fear of falls. Following commands: Impaired Following commands impaired: Follows one step commands with increased time    Cueing Cueing Techniques: Verbal cues, Gestural cues, Tactile cues, Visual cues  Exercises General Exercises - Lower Extremity Long Arc Quad: AROM, Both, Seated, AAROM, 10 reps Hip ABduction/ADduction: AAROM, Both, 5 reps, Supine Hip Flexion/Marching: AROM, 10 reps, Seated, Both Other Exercises Other Exercises: seated anterior lean in recliner for placement/adjustment of pillow behind his back/lowr back, pt pulling up with bil arm rests of chair for forward "crunch" x3 reps    General Comments General comments (skin integrity, edema, etc.): pillows under his hips for comfort up in chair;also  under LUE to promote neutral upright posture in chair and heels floated in recliner; chair alarm on/activated      Pertinent Vitals/Pain Pain Assessment Pain Assessment: Faces Faces Pain Scale: Hurts a little bit Pain Location: Generalized with movement Pain Descriptors / Indicators: Grimacing, Guarding Pain Intervention(s): Limited activity within patient's tolerance, Monitored during session, Repositioned    Home Living                          Prior Function            PT Goals (current goals can now be found in the care plan section) Acute Rehab PT Goals PT Goal Formulation: With patient/family Time For Goal Achievement: 06/24/23 Progress towards PT goals: Progressing toward goals    Frequency    Min 2X/week      PT Plan       Co-evaluation              AM-PAC PT "6 Clicks" Mobility   Outcome Measure  Help needed turning from your back to your side while in a flat bed without using bedrails?: Total Help needed moving from lying on your back to sitting on the side of a flat bed without using bedrails?: Total Help needed moving to and from a bed to a chair (including a wheelchair)?: Total Help needed standing up from a chair using your arms (e.g., wheelchair or bedside chair)?: Total Help needed to walk in hospital room?: Total Help needed climbing 3-5 steps with a railing? : Total 6 Click Score: 6    End of Session Equipment Utilized During Treatment: Other (comment) (abdominal binder already donned) Activity Tolerance: Patient tolerated treatment well Patient left: with call bell/phone within reach;with family/visitor present;Other (comment);in chair;with chair alarm set (spouse in room) Nurse Communication: Mobility status;Need for lift equipment;Other (comment) (mechanical lift battery is on charger) PT Visit Diagnosis: Other abnormalities of gait and mobility (R26.89);Muscle weakness (generalized) (M62.81)     Time: 1420-1430 PT Time Calculation (min) (ACUTE ONLY): 10 min  Charges:    $Therapeutic Activity: 8-22 mins PT General Charges $$ ACUTE PT VISIT: 1 Visit                     Jabron Weese P., PTA Acute Rehabilitation Services  Secure Chat Preferred 9a-5:30pm Office: [phone]    Dorathy Kinsman [facility] 06/21/2023, 3:55 PM

## 2023-06-22 DIAGNOSIS — I610 Nontraumatic intracerebral hemorrhage in hemisphere, subcortical: Secondary | ICD-10-CM | POA: Diagnosis not present

## 2023-06-22 DIAGNOSIS — I6389 Other cerebral infarction: Secondary | ICD-10-CM | POA: Diagnosis not present

## 2023-06-22 DIAGNOSIS — I629 Nontraumatic intracranial hemorrhage, unspecified: Secondary | ICD-10-CM | POA: Diagnosis not present

## 2023-06-22 DIAGNOSIS — E43 Unspecified severe protein-calorie malnutrition: Secondary | ICD-10-CM | POA: Diagnosis not present

## 2023-06-22 LAB — CBC
HCT: 36 % — ABNORMAL LOW (ref 39.0–52.0)
Hemoglobin: 12.5 g/dL — ABNORMAL LOW (ref 13.0–17.0)
MCH: 29.3 pg (ref 26.0–34.0)
MCHC: 34.7 g/dL (ref 30.0–36.0)
MCV: 84.5 fL (ref 80.0–100.0)
Platelets: 498 10*3/uL — ABNORMAL HIGH (ref 150–400)
RBC: 4.26 MIL/uL (ref 4.22–5.81)
RDW: 16.9 % — ABNORMAL HIGH (ref 11.5–15.5)
WBC: 10.1 10*3/uL (ref 4.0–10.5)
nRBC: 0 % (ref 0.0–0.2)

## 2023-06-22 LAB — COMPREHENSIVE METABOLIC PANEL WITH GFR
ALT: 20 U/L (ref 0–44)
AST: 20 U/L (ref 15–41)
Albumin: 2.8 g/dL — ABNORMAL LOW (ref 3.5–5.0)
Alkaline Phosphatase: 84 U/L (ref 38–126)
Anion gap: 10 (ref 5–15)
BUN: 35 mg/dL — ABNORMAL HIGH (ref 8–23)
CO2: 26 mmol/L (ref 22–32)
Calcium: 9.9 mg/dL (ref 8.9–10.3)
Chloride: 101 mmol/L (ref 98–111)
Creatinine, Ser: 0.76 mg/dL (ref 0.61–1.24)
GFR, Estimated: >60 mL/min (ref >60)
Glucose, Bld: 124 mg/dL — ABNORMAL HIGH (ref 70–99)
Potassium: 3.9 mmol/L (ref 3.5–5.1)
Sodium: 137 mmol/L (ref 135–145)
Total Bilirubin: 0.5 mg/dL (ref 0.0–1.2)
Total Protein: 7.2 g/dL (ref 6.5–8.1)

## 2023-06-22 LAB — GLUCOSE, CAPILLARY
Glucose-Capillary: 135 mg/dL — ABNORMAL HIGH (ref 70–99)
Glucose-Capillary: 139 mg/dL — ABNORMAL HIGH (ref 70–99)
Glucose-Capillary: 140 mg/dL — ABNORMAL HIGH (ref 70–99)
Glucose-Capillary: 144 mg/dL — ABNORMAL HIGH (ref 70–99)
Glucose-Capillary: 145 mg/dL — ABNORMAL HIGH (ref 70–99)

## 2023-06-22 LAB — TSH: TSH: 3.856 u[IU]/mL (ref 0.350–4.500)

## 2023-06-22 LAB — MAGNESIUM: Magnesium: 2.2 mg/dL (ref 1.7–2.4)

## 2023-06-22 MED ORDER — METOPROLOL TARTRATE 12.5 MG HALF TABLET
12.5000 mg | ORAL_TABLET | Freq: Two times a day (BID) | ORAL | Status: DC
  Administered 2023-06-22 – 2023-06-23 (×3): 12.5 mg
  Filled 2023-06-22 (×3): qty 1

## 2023-06-22 NOTE — Plan of Care (Signed)

## 2023-06-22 NOTE — Progress Notes (Signed)
 PROGRESS NOTE  TRE SANKER [name]:[id number] DOB: 06-09-1940   PCP: [name], MD  Patient is from: Home.  DOA: 05/11/2023 LOS: 42  Chief complaints No chief complaint on file.    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 He was admitted to neuro ICU, eventually stabilized and transferred to the hospitalist service. Hospital course complicated by aspiration pneumonia, failure to progress, persistent dysphagia and eventually had to have a PEG tube placed on 3/18.  He was on tube feed.  Upgraded to regular diet on 3/26.  P.o. intake improved.  TF changed to as needed based on p.o. intake.  Insurance denied LTACH even after appeal.  TOC and family working on SNF.  Medically stable for discharge.  Subjective: Seen and examined earlier this morning.  No major events overnight of this morning.  No complaints but not a great historian.  [name] son at bedside feeding patient.  He is concerned about confusion and "delirium" at night.  We have discussed about delirium and precautions.   Objective: Vitals:   06/21/23 1948 06/22/23 0418 06/22/23 0653 06/22/23 0747  BP: (!) 140/71 135/66 120/68 134/72  Pulse: (!) 109 (!) 114 (!) 108 (!) 101  Resp: 18 17 18    Temp: (!) 97.3 F (36.3 C) 98.1 F (36.7 C) 98.8 F (37.1 C) 98.1 F (36.7 C)  TempSrc: Oral Oral Oral   SpO2: 93% 91% 95% 95%  Weight:  65 kg    Height:        Examination:  GENERAL: No apparent distress.  Nontoxic. HEENT: MMM.  Vision and hearing grossly intact.  NECK: Supple.  No apparent JVD.  RESP:  No IWOB.  Fair aeration bilaterally. CVS: HR low 100s.  Regular rhythm.  Heart sounds normal.  ABD/GI/GU: BS+. Abd soft, NTND.  G-tube in place. MSK/EXT:  Moves extremities. No apparent deformity. No edema.   SKIN: no apparent skin lesion or wound NEURO: Awake.  Oriented to self, place and person.  Follows commands.  PSYCH: Calm. Normal affect.   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  Given underlying dementia palliative was consulted, GOC discussions were held and ongoing medical care has been established.  Due to persistent dysphagia, PEG tube placed by general surgery on 3/18.  Advanced to regular diet on 3/26 -On vegetarian diet -TF changed to as needed -LTACH denied even after appeal.  Working on SNF placement.   Recurrent aspiration pneumonia: Completed antibiotic course for this.  He now has a PEG tube.  On room air. -Aspiration precaution   Acute respiratory failure with hypoxia due to aspiration pneumonia: Resolved.   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 Continue statins.  Severe dementia without behavioral disturbance/delirium: Awake and alert but only oriented to self and place.  Follows commands. -Reorientation and delirium precautions.  Discussed this with patient's son at bedside  Dysphagia: PEG tube placed on 3/18 by surgery and was on TF. Upgraded to regular diet on 3/26. -TF and diet as above.   Oral thrush -  Received Diflucan from 3/16-3/24.  Also received nystatin.   Acute kidney injury-Likely hemodynamic mediated.  Resolved.   Essential hypertension/sinus tachycardia: Normotensive -Continue amlodipine -Add low-dose metoprolol for tachycardia.   Hyperlipidemia  -Continue statins.    Severe malnutrition/dysphagia Body mass index is 21.79  kg/m. Nutrition Problem: Severe Malnutrition Etiology: chronic illness Signs/Symptoms: severe fat depletion, severe muscle depletion Interventions: Tube feeding, MVI, Prostat   DVT prophylaxis:  heparin injection 5,000 Units Start: 05/15/23 1400 SCD's Start: 05/11/23 2019  Code Status: DNR/DNI Family Communication: Updated patient's son at bedside. Level of care: Med-Surg Status is: Inpatient Remains inpatient appropriate because: SNF bed.   Final disposition: SNF  35 minutes with more than 50% spent in reviewing records, counseling patient/family and coordinating care.   Sch Meds:  Scheduled Meds:  amLODipine  10 mg Per Tube Daily   feeding supplement  237 mL Oral BID BM   feeding supplement (OSMOLITE 1.5 CAL)  237 mL Per Tube TID with meals   feeding supplement (PROSource TF20)  60 mL Per Tube Daily   free water  200 mL Per Tube QID   heparin injection (subcutaneous)  5,000 Units Subcutaneous Q12H   multivitamin with minerals  1 tablet Per Tube Daily   Continuous Infusions:   PRN Meds:.acetaminophen **OR** acetaminophen (TYLENOL) oral liquid 160 mg/5 mL **OR** acetaminophen, glycopyrrolate, guaiFENesin-dextromethorphan, naphazoline-pheniramine, mouth rinse  Antimicrobials: Anti-infectives (From admission, onward)    Start     Dose/Rate Route Frequency Ordered Stop   06/11/23 1800  fluconazole (DIFLUCAN) tablet 100 mg  Status:  Discontinued        100 mg Per Tube Every evening 06/11/23 1526 06/13/23 1052   06/08/23 1600  fluconazole (DIFLUCAN) IVPB 100 mg  Status:  Discontinued        100 mg 50 mL/hr over 60 Minutes Intravenous Every 24 hours 06/07/23 1457 06/11/23 1526   06/07/23 1545  fluconazole (DIFLUCAN) IVPB 200 mg        200 mg 100 mL/hr over 60 Minutes Intravenous  Once 06/07/23 1457 06/07/23 1812   06/07/23 0600  ceFAZolin (ANCEF) IVPB 2g/100 mL premix        2 g 200 mL/hr over 30 Minutes Intravenous  Once 06/06/23 1423 06/07/23 0940   06/02/23 2200   metroNIDAZOLE (FLAGYL) IVPB 500 mg        500 mg 100 mL/hr over 60 Minutes Intravenous Every 12 hours 06/02/23 0908 06/02/23 2246   06/02/23 1400  ceFEPIme (MAXIPIME) 2 g in sodium chloride 0.9 % 100 mL IVPB        2 g 200 mL/hr over 30 Minutes Intravenous Every 8 hours 06/02/23 0908 06/02/23 1454   05/23/23 1130  metroNIDAZOLE (FLAGYL) IVPB 500 mg  Status:  Discontinued        500 mg 100 mL/hr over 60 Minutes Intravenous Every 12 hours 05/23/23 1033 06/02/23 0908   05/22/23 1330  ceFEPIme (MAXIPIME) 2 g in sodium chloride 0.9 % 100 mL IVPB  Status:  Discontinued        2 g 200 mL/hr over 30 Minutes Intravenous Every 8 hours 05/22/23 1257 06/02/23 0908   05/20/23 1415  amoxicillin-clavulanate (AUGMENTIN) 400-57 MG/5ML suspension 500 mg  Status:  Discontinued        500 mg Per Tube Every 8 hours 05/20/23 1323 05/22/23 1257   05/20/23 1000  amoxicillin-clavulanate (AUGMENTIN) 875-125 MG per tablet 1 tablet  Status:  Discontinued        1 tablet Oral Every 12 hours 05/20/23 0716  05/20/23 0723   05/20/23 0815  amoxicillin-clavulanate (AUGMENTIN) 250-62.5 MG/5ML suspension 500 mg  Status:  Discontinued        500 mg Per Tube Every 8 hours 05/20/23 0723 05/20/23 1323   05/17/23 1400  metroNIDAZOLE (FLAGYL) IVPB 500 mg  Status:  Discontinued        500 mg 100 mL/hr over 60 Minutes Intravenous Every 12 hours 05/17/23 1322 05/20/23 0716   05/17/23 1130  linezolid (ZYVOX) IVPB 600 mg  Status:  Discontinued        600 mg 300 mL/hr over 60 Minutes Intravenous Every 12 hours 05/17/23 1043 05/17/23 1322   05/16/23 1100  ceFEPIme (MAXIPIME) 2 g in sodium chloride 0.9 % 100 mL IVPB  Status:  Discontinued        2 g 200 mL/hr over 30 Minutes Intravenous Every 12 hours 05/16/23 1012 05/20/23 0716   05/15/23 1400  cefTRIAXone (ROCEPHIN) 2 g in sodium chloride 0.9 % 100 mL IVPB  Status:  Discontinued        2 g 200 mL/hr over 30 Minutes Intravenous Every 24 hours 05/15/23 1313 05/16/23 1012   05/15/23 1345   cefTRIAXone (ROCEPHIN) 1 g in sodium chloride 0.9 % 100 mL IVPB  Status:  Discontinued        1 g 200 mL/hr over 30 Minutes Intravenous Every 24 hours 05/15/23 1254 05/15/23 1313   05/11/23 2230  cefTRIAXone (ROCEPHIN) 1 g in sodium chloride 0.9 % 100 mL IVPB        1 g 200 mL/hr over 30 Minutes Intravenous Daily at bedtime 05/11/23 2153 05/12/23 2209        I have personally reviewed the following labs and images: CBC: No results for input(s): "WBC", "NEUTROABS", "HGB", "HCT", "MCV", "PLT" in the last 168 hours.  BMP &GFR No results for input(s): "NA", "K", "CL", "CO2", "GLUCOSE", "BUN", "CREATININE", "CALCIUM", "MG", "PHOS" in the last 168 hours.  Invalid input(s): "GFRCG"  Estimated Creatinine Clearance: 62.1 mL/min (A) (by C-G formula based on SCr of 0.59 mg/dL (L)). Liver & Pancreas: No results for input(s): "AST", "ALT", "ALKPHOS", "BILITOT", "PROT", "ALBUMIN" in the last 168 hours.  No results for input(s): "LIPASE", "AMYLASE" in the last 168 hours. No results for input(s): "AMMONIA" in the last 168 hours. Diabetic: No results for input(s): "HGBA1C" in the last 72 hours. Recent Labs  Lab 06/21/23 1319 06/21/23 1808 06/22/23 0009 06/22/23 0746 06/22/23 1155  GLUCAP 149* 169* 139* 145* 140*   Cardiac Enzymes: No results for input(s): "CKTOTAL", "CKMB", "CKMBINDEX", "TROPONINI" in the last 168 hours. No results for input(s): "PROBNP" in the last 8760 hours. Coagulation Profile: No results for input(s): "INR", "PROTIME" in the last 168 hours. Thyroid Function Tests: No results for input(s): "TSH", "T4TOTAL", "FREET4", "T3FREE", "THYROIDAB" in the last 72 hours. Lipid Profile: No results for input(s): "CHOL", "HDL", "LDLCALC", "TRIG", "CHOLHDL", "LDLDIRECT" in the last 72 hours. Anemia Panel: No results for input(s): "VITAMINB12", "FOLATE", "FERRITIN", "TIBC", "IRON", "RETICCTPCT" in the last 72 hours. Urine analysis:    Component Value Date/Time   COLORURINE  YELLOW 05/17/2023 1430   APPEARANCEUR CLEAR 05/17/2023 1430   APPEARANCEUR Clear 07/23/2021 1535   LABSPEC 1.026 05/17/2023 1430   PHURINE 5.0 05/17/2023 1430   GLUCOSEU NEGATIVE 05/17/2023 1430   HGBUR NEGATIVE 05/17/2023 1430   BILIRUBINUR NEGATIVE 05/17/2023 1430   BILIRUBINUR 1+ 05/04/2023 1015   BILIRUBINUR Negative 07/23/2021 1535   KETONESUR NEGATIVE 05/17/2023 1430   PROTEINUR NEGATIVE 05/17/2023 1430   UROBILINOGEN 0.2 (  A) 05/04/2023 1015   NITRITE NEGATIVE 05/17/2023 1430   LEUKOCYTESUR NEGATIVE 05/17/2023 1430   Sepsis Labs: Invalid input(s): "PROCALCITONIN", "LACTICIDVEN"  Microbiology: No results found for this or any previous visit (from the past 240 hours).  Radiology Studies: No results found.    Kaydee Magel T. Treshaun Carrico [facility]  If 7PM-7AM, please contact night-coverage [email or web address] 06/22/2023, 12:23 PM

## 2023-06-22 NOTE — Progress Notes (Signed)
 Palliative:  HPI: 83 y.o. 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 Hospitalization complicated by ongoing aspiration and fever. PEG placed 3/18. Improvement in dysphagia and intake now on regular diet.   I met today at Derek Yates's bedside along with wife. Derek Yates is very sleepy today during my visit and wife reports that he has been this way since this morning but he did eat breakfast for son this morning. Wife is hopeful that he will awaken enough to eat some dinner. She reports that he has not been this sleepy - reviewed and no changes in vitals or labs that would cause lethargy - will monitor. I brought fresh water, Ensure, and applesauce that wife will try and offer him later. Attempted to offer him water but he continued to sleep and did not acknowledge or accept at this time.   I was notified by RN that son wished to fill out MOST form but he was no longer at the bedside when I was able to visit. I left him voicemail offering to discuss and assist with MOST form as desired. Plans for transition to SNF rehab when bed is secured.   All questions/concerns addressed to the best of my ability.   Exam: Lethargic. Does not awaken for me to verbal or tactile stimuli. I did not attempt to sternal rub. No distress. Abd flat. Warm to touch.   Plan: - DNR/DNI - Awaiting SNF rehab bed  40 min  Yong Channel, NP Palliative Medicine Team Pager [phone] (Please see [email or web address] for schedule) Team Phone [phone]

## 2023-06-22 NOTE — TOC Progression Note (Addendum)
 Transition of Care [facility]) - Progression Note    Patient Details  Name: Derek Yates MRN: [id number] Date of Birth: 1940/12/30  Transition [facility] CM/SW Contact  Derek Crosland A [place], LCSW Phone Number: 06/22/2023, 1:49 PM  Clinical Narrative:      Update 1600 CSW contacted [facility] to provide SNF facility change. Pt's insurance authorization was approved for [facility]. Derek Yates ID: [id number]  CSW reached out to facility to find out if bed is available tomorrow.    Update 1440 CSW was notified by pt's son, wanted to change rehab facility to [facility]. CSW confirmed bed available and that pt can go to that facility instead when authorization was approved. CSW to follow up with insurance to change SNF facility to [facility].    TOC will continue to follow.     CSW spoke with pt's son, Derek Yates at bedside with pt and pt's spouse.   CSW informed him that pt's authorization has been started with [employer]. Stated he still wanted to review one more facility at [facility], to make sure he was settled on [facility].   Status pending. Auth IDBerkley Harvey [id number]   CSW provided resource for personal care service. Family considering paying out of pocket for sitter care when family is unable to be at the rehab facility.   CSW to follow up with pt and get final decision and assist with other possible disposition needs before pt's discharge.    TOC will continue to follow.   Expected Discharge Plan: Skilled Nursing Facility Barriers to Discharge: English as a second language teacher, Continued Medical Work up  Expected Discharge Plan and Services   Discharge Planning Services: CM Consult Post Acute Care Choice: Hospice Living arrangements for the past 2 months: Single Family Home                             [facility] Agency: [facility] Date [facility] Contacted: 05/31/23   Representative spoke with at [facility] Agency:  Efraim Kaufmann   Social Determinants of Health (SDOH) Interventions SDOH Screenings   Food Insecurity: Patient Unable To Answer (05/13/2023)  Housing: Patient Unable To Answer (05/13/2023)  Transportation Needs: Patient Unable To Answer (05/13/2023)  Utilities: Patient Unable To Answer (05/13/2023)  Depression (PHQ2-9): Medium Risk (02/10/2023)  Financial Resource Strain: Low Risk  (06/20/2021)   Received from [facility]  Social Connections: Patient Unable To Answer (05/13/2023)  Tobacco Use: Low Risk  (06/07/2023)    Readmission Risk Interventions     No data to display

## 2023-06-23 DIAGNOSIS — E43 Unspecified severe protein-calorie malnutrition: Secondary | ICD-10-CM | POA: Diagnosis not present

## 2023-06-23 DIAGNOSIS — R059 Cough, unspecified: Secondary | ICD-10-CM | POA: Diagnosis not present

## 2023-06-23 DIAGNOSIS — I610 Nontraumatic intracerebral hemorrhage in hemisphere, subcortical: Secondary | ICD-10-CM | POA: Diagnosis not present

## 2023-06-23 DIAGNOSIS — G9389 Other specified disorders of brain: Secondary | ICD-10-CM | POA: Diagnosis not present

## 2023-06-23 DIAGNOSIS — E782 Mixed hyperlipidemia: Secondary | ICD-10-CM | POA: Diagnosis not present

## 2023-06-23 DIAGNOSIS — M25512 Pain in left shoulder: Secondary | ICD-10-CM | POA: Diagnosis present

## 2023-06-23 DIAGNOSIS — R269 Unspecified abnormalities of gait and mobility: Secondary | ICD-10-CM | POA: Diagnosis not present

## 2023-06-23 DIAGNOSIS — Z931 Gastrostomy status: Secondary | ICD-10-CM | POA: Diagnosis not present

## 2023-06-23 DIAGNOSIS — R000 Tachycardia, unspecified: Secondary | ICD-10-CM | POA: Diagnosis not present

## 2023-06-23 DIAGNOSIS — R2689 Other abnormalities of gait and mobility: Secondary | ICD-10-CM | POA: Diagnosis not present

## 2023-06-23 DIAGNOSIS — M25532 Pain in left wrist: Secondary | ICD-10-CM | POA: Diagnosis not present

## 2023-06-23 DIAGNOSIS — R0602 Shortness of breath: Secondary | ICD-10-CM | POA: Diagnosis not present

## 2023-06-23 DIAGNOSIS — M25562 Pain in left knee: Secondary | ICD-10-CM | POA: Diagnosis not present

## 2023-06-23 DIAGNOSIS — I639 Cerebral infarction, unspecified: Secondary | ICD-10-CM | POA: Diagnosis not present

## 2023-06-23 DIAGNOSIS — N390 Urinary tract infection, site not specified: Secondary | ICD-10-CM | POA: Diagnosis not present

## 2023-06-23 DIAGNOSIS — R509 Fever, unspecified: Secondary | ICD-10-CM | POA: Diagnosis not present

## 2023-06-23 DIAGNOSIS — F0390 Unspecified dementia without behavioral disturbance: Secondary | ICD-10-CM | POA: Diagnosis not present

## 2023-06-23 DIAGNOSIS — R519 Headache, unspecified: Secondary | ICD-10-CM | POA: Diagnosis not present

## 2023-06-23 DIAGNOSIS — R0989 Other specified symptoms and signs involving the circulatory and respiratory systems: Secondary | ICD-10-CM | POA: Diagnosis not present

## 2023-06-23 DIAGNOSIS — Z515 Encounter for palliative care: Secondary | ICD-10-CM | POA: Diagnosis not present

## 2023-06-23 DIAGNOSIS — Z043 Encounter for examination and observation following other accident: Secondary | ICD-10-CM | POA: Diagnosis not present

## 2023-06-23 DIAGNOSIS — R5383 Other fatigue: Secondary | ICD-10-CM | POA: Diagnosis not present

## 2023-06-23 DIAGNOSIS — R569 Unspecified convulsions: Secondary | ICD-10-CM | POA: Diagnosis not present

## 2023-06-23 DIAGNOSIS — W19XXXA Unspecified fall, initial encounter: Secondary | ICD-10-CM | POA: Diagnosis not present

## 2023-06-23 DIAGNOSIS — R9082 White matter disease, unspecified: Secondary | ICD-10-CM | POA: Diagnosis not present

## 2023-06-23 DIAGNOSIS — R404 Transient alteration of awareness: Secondary | ICD-10-CM | POA: Diagnosis not present

## 2023-06-23 DIAGNOSIS — I10 Essential (primary) hypertension: Secondary | ICD-10-CM | POA: Diagnosis not present

## 2023-06-23 DIAGNOSIS — R82998 Other abnormal findings in urine: Secondary | ICD-10-CM | POA: Diagnosis not present

## 2023-06-23 DIAGNOSIS — D849 Immunodeficiency, unspecified: Secondary | ICD-10-CM | POA: Diagnosis not present

## 2023-06-23 DIAGNOSIS — R0902 Hypoxemia: Secondary | ICD-10-CM | POA: Diagnosis not present

## 2023-06-23 DIAGNOSIS — M19012 Primary osteoarthritis, left shoulder: Secondary | ICD-10-CM | POA: Diagnosis not present

## 2023-06-23 DIAGNOSIS — I6389 Other cerebral infarction: Secondary | ICD-10-CM | POA: Diagnosis not present

## 2023-06-23 DIAGNOSIS — R58 Hemorrhage, not elsewhere classified: Secondary | ICD-10-CM | POA: Diagnosis not present

## 2023-06-23 DIAGNOSIS — R262 Difficulty in walking, not elsewhere classified: Secondary | ICD-10-CM | POA: Diagnosis not present

## 2023-06-23 DIAGNOSIS — R1310 Dysphagia, unspecified: Secondary | ICD-10-CM | POA: Diagnosis not present

## 2023-06-23 DIAGNOSIS — B351 Tinea unguium: Secondary | ICD-10-CM | POA: Diagnosis not present

## 2023-06-23 DIAGNOSIS — M6281 Muscle weakness (generalized): Secondary | ICD-10-CM | POA: Diagnosis not present

## 2023-06-23 DIAGNOSIS — M1812 Unilateral primary osteoarthritis of first carpometacarpal joint, left hand: Secondary | ICD-10-CM | POA: Diagnosis not present

## 2023-06-23 DIAGNOSIS — J690 Pneumonitis due to inhalation of food and vomit: Secondary | ICD-10-CM | POA: Diagnosis not present

## 2023-06-23 DIAGNOSIS — R296 Repeated falls: Secondary | ICD-10-CM | POA: Diagnosis not present

## 2023-06-23 DIAGNOSIS — Z23 Encounter for immunization: Secondary | ICD-10-CM | POA: Diagnosis present

## 2023-06-23 DIAGNOSIS — I63519 Cerebral infarction due to unspecified occlusion or stenosis of unspecified middle cerebral artery: Secondary | ICD-10-CM | POA: Diagnosis not present

## 2023-06-23 DIAGNOSIS — D72829 Elevated white blood cell count, unspecified: Secondary | ICD-10-CM | POA: Diagnosis not present

## 2023-06-23 DIAGNOSIS — G8102 Flaccid hemiplegia affecting left dominant side: Secondary | ICD-10-CM | POA: Diagnosis not present

## 2023-06-23 DIAGNOSIS — Z7401 Bed confinement status: Secondary | ICD-10-CM | POA: Diagnosis not present

## 2023-06-23 DIAGNOSIS — R051 Acute cough: Secondary | ICD-10-CM | POA: Diagnosis not present

## 2023-06-23 LAB — GLUCOSE, CAPILLARY: Glucose-Capillary: 107 mg/dL — ABNORMAL HIGH (ref 70–99)

## 2023-06-23 MED ORDER — METOPROLOL TARTRATE 25 MG PO TABS: 12.5000 mg | ORAL_TABLET | Freq: Two times a day (BID) | ORAL | Status: DC

## 2023-06-23 MED ORDER — PROSOURCE TF20 ENFIT COMPATIBL EN LIQD: 60.0000 mL | Freq: Every day | ENTERAL | Status: DC

## 2023-06-23 MED ORDER — FREE WATER: 100.0000 mL | Freq: Three times a day (TID) | Status: DC

## 2023-06-23 MED ORDER — AMLODIPINE BESYLATE 10 MG PO TABS: 10.0000 mg | ORAL_TABLET | Freq: Every day | ORAL | Status: DC

## 2023-06-23 MED ORDER — ENSURE ENLIVE PO LIQD: 237.0000 mL | Freq: Two times a day (BID) | ORAL | Status: DC

## 2023-06-23 MED ORDER — ATORVASTATIN CALCIUM 10 MG PO TABS: 10.0000 mg | ORAL_TABLET | Freq: Every day | ORAL | Status: DC

## 2023-06-23 MED ORDER — ACETAMINOPHEN 325 MG PO TABS: 650.0000 mg | ORAL_TABLET | ORAL | Status: DC | PRN

## 2023-06-23 MED ORDER — OSMOLITE 1.5 CAL PO LIQD: 237.0000 mL | Freq: Three times a day (TID) | ORAL | Status: DC

## 2023-06-23 MED ORDER — ADULT MULTIVITAMIN W/MINERALS CH: 1.0000 | ORAL_TABLET | Freq: Every day | ORAL | Status: DC

## 2023-06-23 NOTE — Progress Notes (Signed)
 Ambulance here to transfer pt to [facility], report called to [employer], vss, pt had BM prior to transfer, pt all cleaned up, peg care also done... some healing crust around peg otherwise skin around it intact. Buttocks skin intact , new mepilex applied for comfort and protection. Son at bedside .

## 2023-06-23 NOTE — TOC Transition Note (Signed)
 Transition of Care [facility]) - Discharge Note   Patient Details  Name: Derek Yates MRN: [id number] Date of Birth: 1940-04-25  Transition of Care [facility]) CM/SW Contact:  Ermal Brzozowski A [place], LCSW Phone Number: 06/23/2023, 11:16 AM   Clinical Narrative:     Patient will DC to: [facility]  Anticipated DC date: 06/23/23  Family notified: Producer, television/film/video  Transport by: Theodoro Grist ID: [id number]   Approval Dates: 06/22/23-06/24/23   Per MD patient ready for DC to  [facility]. RN, patient, patient's family, and facility notified of DC. Discharge Summary and FL2 sent to facility. RN to call report prior to discharge (Room [phone], [phone]). DC packet on chart. Ambulance transport requested for patient.     CSW will sign off for now as social work intervention is no longer needed. Please consult [place] again if new needs arise.   Final next level of care: Skilled Nursing Facility Barriers to Discharge: Barriers Resolved   Patient Goals and CMS Choice Patient states their goals for this hospitalization and ongoing recovery are:: comfort care- hospice CMS Medicare.gov Compare Post Acute Care list provided to:: Patient Represenative (must comment) Choice offered to / list presented to : [facility] POA / Guardian, Adult Children      Discharge Placement              Patient [facility] bed at: [facility] Patient to be transferred to facility by: [facility] Name of family member notified: Producer, television/film/video Patient and family notified of of transfer: 06/23/23  Discharge Plan and Services Additional resources added to the After Visit Summary for     Discharge Planning Services: CM Consult Post Acute Care Choice: Hospice                      [facility] Agency: [facility] Date [facility] Agency Contacted: 05/31/23   Representative spoke with at [facility] Agency: Efraim Kaufmann  Social Drivers of Health (SDOH) Interventions SDOH Screenings   Food Insecurity: Patient  Unable To Answer (05/13/2023)  Housing: Patient Unable To Answer (05/13/2023)  Transportation Needs: Patient Unable To Answer (05/13/2023)  Utilities: Patient Unable To Answer (05/13/2023)  Depression (PHQ2-9): Medium Risk (02/10/2023)  Financial Resource Strain: Low Risk  (06/20/2021)   Received from [facility], [facility]  Social Connections: Patient Unable To Answer (05/13/2023)  Tobacco Use: Low Risk  (06/07/2023)     Readmission Risk Interventions     No data to display

## 2023-06-23 NOTE — Plan of Care (Signed)
  Problem: Education: Goal: Knowledge of General Education information will improve Description: Including pain rating scale, medication(s)/side effects and non-pharmacologic comfort measures 06/23/2023 0430 by Lanny Hurst, RN Outcome: Progressing 06/23/2023 0325 by Lanny Hurst, RN Outcome: Progressing   Problem: Health Behavior/Discharge Planning: Goal: Ability to manage health-related needs will improve 06/23/2023 0430 by Lanny Hurst, RN Outcome: Progressing 06/23/2023 0325 by Lanny Hurst, RN Outcome: Progressing   Problem: Clinical Measurements: Goal: Ability to maintain clinical measurements within normal limits will improve 06/23/2023 0430 by Lanny Hurst, RN Outcome: Progressing 06/23/2023 0325 by Lanny Hurst, RN Outcome: Progressing Goal: Will remain free from infection 06/23/2023 0430 by Lanny Hurst, RN Outcome: Progressing 06/23/2023 0325 by Lanny Hurst, RN Outcome: Progressing Goal: Diagnostic test results will improve 06/23/2023 0430 by Lanny Hurst, RN Outcome: Progressing 06/23/2023 0325 by Lanny Hurst, RN Outcome: Progressing   Problem: Education: Goal: Knowledge of disease or condition will improve Outcome: Progressing Goal: Knowledge of secondary prevention will improve (MUST DOCUMENT ALL) Outcome: Progressing Goal: Knowledge of patient specific risk factors will improve (DELETE if not current risk factor) Outcome: Progressing   Problem: Intracerebral Hemorrhage Tissue Perfusion: Goal: Complications of Intracerebral Hemorrhage will be minimized Outcome: Progressing   Problem: Coping: Goal: Will verbalize positive feelings about self Outcome: Progressing Goal: Will identify appropriate support needs Outcome: Progressing   Problem: Health Behavior/Discharge Planning: Goal: Ability to manage health-related needs will improve Outcome: Progressing Goal: Goals will be  collaboratively established with patient/family Outcome: Progressing   Problem: Self-Care: Goal: Ability to participate in self-care as condition permits will improve Outcome: Progressing Goal: Verbalization of feelings and concerns over difficulty with self-care will improve Outcome: Progressing Goal: Ability to communicate needs accurately will improve Outcome: Progressing   Problem: Nutrition: Goal: Risk of aspiration will decrease Outcome: Progressing Goal: Dietary intake will improve Outcome: Progressing

## 2023-06-23 NOTE — Plan of Care (Signed)

## 2023-06-23 NOTE — Discharge Summary (Signed)
 Physician Discharge Summary  Derek Yates:[id number] DOB: 28-Jan-1941 DOA: 05/11/2023  PCP: [name], MD  Admit date: 05/11/2023 Discharge date: 06/23/23  Admitted From: Home Disposition: SNF Recommendations for Outpatient Follow-up:  Outpatient follow-up with neurology and surgery as below Check CMP and CBC in 1 week Please follow up on the following pending results: None   Discharge Condition: Stable CODE STATUS: DNR/DNI  Contact information for follow-up providers     [facility]. Schedule an appointment as soon as possible for a visit in 1 month(s).   Specialty: Neurology Why: stroke clinic Contact information: [address] [place] [address] [phone]        Surgery, [place]. Call.   Specialty: General Surgery Why: As needed if concerns regarding PEG tube Contact information: [address] [place] [address] [phone]              Contact information for after-discharge care     Destination     [facility] Preferred SNF .   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  He was admitted to neuro ICU.   Repeat CT Head without significant change in the size of the intraparenchymal hemorrhage in the right basal ganglia. Intraventricular penetration with the amount of intraventricular blood being similar. CTA not pursued as it will not change mangement. MRI ICH centered at the right caudate head with IVH and mild communicating hydrocephalus. Numerous chronic microhemorrhages in a  predominantly peripheral distribution, which may be due to cerebral amyloid angiopathy. Old left frontal, left occipital and right parieto-occipital infarcts.  Repeat CT head on 2/21 showed roughly 6 mL Right basal ganglia hemorrhage has increased from approximately 4 mL at presentation. Stable moderate volume of IVH. No acute ventriculomegaly.  Mild leftward shift of 3 mm stable.  Repeat CT head on 2/22 showed stable right basal ganglia hemorrhage, intraventricular extension and mild intracranial mass effect.  TTE with LVEF of 65 to 70%.  LDL 69.  A1c 5.7%. Patient was stabilized in ICU, and transferred to the hospitalist service. Hospital course complicated by aspiration pneumonia, failure to progress, persistent dysphagia requiring tube feeding and PEG tube placement on 06/07/2023.  Eventually, dysphagia improved significantly and he was upgraded to regular diet on 06/15/2023.  Oral intake improved as well.  TF changed to as needed based on p.o. intake.  Insurance denied LTACH even after appeal.  He is discharged to SNF for further rehabilitation and care.  Patient was seen by palliative and CODE STATUS changed to DNR/DNI.   See individual problem list below for more.   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  Given underlying dementia palliative was consulted, GOC discussions were held and ongoing medical care has been established.  Due to persistent dysphagia, PEG tube placed by general surgery on 3/18.  Advanced to regular diet on 3/26 -On vegetarian diet. TF changed to as needed -Outpatient follow-up with neurology as above   Recurrent aspiration pneumonia:  Completed antibiotic course for this.  He now has a PEG tube.  On room air.  Continue aspiration precaution   Acute respiratory failure with hypoxia due to aspiration pneumonia: Resolved.   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 Continue statins.   Severe dementia without behavioral disturbance/delirium: Awake and alert but only oriented to self and place.  Follows commands. -Reorientation and delirium precautions.  Discussed this with patient's son at bedside   Dysphagia: PEG tube placed on 3/18 by surgery and was on TF. Upgraded to regular diet on 3/26. -TF and diet as above.   Oral thrush -Completed course with Diflucan and nystatin   Acute kidney injury-Likely hemodynamic mediated.  Resolved.   Essential hypertension/sinus tachycardia: Normotensive.  Tachycardia resolved. -Continue amlodipine and low-dose metoprolol   Hyperlipidemia  -Continue statins.     Severe malnutrition/dysphagia Nutrition Problem: Severe Malnutrition Etiology: chronic illness Signs/Symptoms: severe fat depletion, severe muscle depletion Interventions: Tube feeding, MVI, Prostat     Time spent 35 minutes  Vital signs Vitals:   06/22/23 1634 06/22/23 2010 06/23/23 0420 06/23/23 0505  BP: 128/75 117/70 118/71   Pulse: 99 (!) 106 93   Temp: 98.4 F (36.9 C) 98.8 F (37.1 C) 98.2 F (36.8 C)   Resp:   19   Height:      Weight:    66.4 kg  SpO2: 96% 96% 95%   TempSrc: Oral Oral Oral   BMI (Calculated):    22.26     Discharge exam GENERAL: No apparent distress.  Nontoxic. HEENT: MMM.  Vision and hearing grossly intact.  NECK: Supple.  No apparent JVD.  RESP:  No IWOB.  Fair aeration bilaterally. CVS: HR low 100s.  Regular rhythm.  Heart sounds normal.  ABD/GI/GU: BS+. Abd soft, NTND.  G-tube in place. MSK/EXT:  Moves extremities. No apparent deformity. No edema.  SKIN: no apparent skin lesion or wound NEURO: Awake.  Oriented to self, place and person.  Follows commands.  PSYCH: Calm. Normal affect.    Discharge Instructions Discharge Instructions     Ambulatory referral to Neurology   Complete by: As directed    Follow up with stroke clinic NP at [facility] in  about 4-8 weeks. Thanks.   Diet general   Complete by: As directed    Diet vegetarian Room service appropriate? Yes; Fluid consistency: Thin   Increase activity slowly   Complete by: As directed       Allergies as of 06/23/2023       Reactions   Beef-derived Drug Products Other (See Comments)   Muslim; pt is vegetarian   Chicken Protein Other (See Comments)   Pt is vegetarian   Pork-derived Products Other (See Comments)   Muslim; pt is vegetarian   Poultry Meal Other (See Comments)   Pt is vegetarian        Medication List     STOP taking these medications    diazepam 2 MG tablet Commonly known as: VALIUM   donepezil 5 MG tablet Commonly known as: ARICEPT   NIFEdipine 30 MG 24 hr tablet Commonly known as: PROCARDIA-XL/NIFEDICAL-XL   polyethylene glycol 17 g packet Commonly known as: MiraLax       TAKE these medications    acetaminophen 325 MG tablet Commonly known as: TYLENOL Take 2 tablets (650 mg total) by mouth every 4 (four) hours as needed for mild pain (pain score 1-3) (or temp > 37.5 C (99.5 F)).   amLODipine 10 MG  tablet Commonly known as: NORVASC Take 1 tablet (10 mg total) by mouth daily.   atorvastatin 10 MG tablet Commonly known as: LIPITOR Take 1 tablet (10 mg total) by mouth daily.   Breo Ellipta 100-25 MCG/ACT Aepb Generic drug: fluticasone furoate-vilanterol Inhale 1 puff into the lungs daily as needed (SOB).   feeding supplement (PROSource TF20) liquid Place 60 mLs into feeding tube daily.   feeding supplement Liqd Take 237 mLs by mouth 2 (two) times daily between meals.   feeding supplement (OSMOLITE 1.5 CAL) Liqd Place 237 mLs into feeding tube with breakfast, with lunch, and with evening meal. If eating less than 50% of his meals   free water Soln Place 100 mLs into feeding tube every 8 (eight) hours.   metoprolol tartrate 25 MG tablet Commonly known as: LOPRESSOR Take 0.5 tablets (12.5 mg total) by mouth 2 (two) times  daily.   multivitamin with minerals Tabs tablet Take 1 tablet by mouth daily.        Consultations: Neurology General Surgery Palliative medicine  Procedures/Studies: G-tube placement on 06/07/2023   DG Swallowing Func-Speech Pathology Result Date: 06/09/2023 Table formatting from the original result was not included. Modified Barium Swallow Study Patient Details Name: JAXON FLATT MRN: [id number] Date of Birth: 1940/11/12 Today's Date: 06/09/2023 HPI/PMH: HPI: Patient is an 83 y.o. male with PMH: dementia, recurrent UTI's, CVA, HTN. He presented to the hospital on 05/11/23 as a code stroke with speech deficits and left sided weakness. 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 Patient has been NPO; Cortrak placed 2/21. MBS 3/10 - recs for NPO due primarily to AMS.  Pt was followed by palliative care; decision had been made to shift to comfort care, then reversed by family and SLP was re-reconsulted to repeat swallow assessment on 3/13. PEG 3/18. Clinical Impression: Clinical Impression: Mr. Schauer was alert and communicative.  He continues to present with significant oral holding of POs, demonstrating minimal movement of tongue and accumulation of POs throughout oral cavity.  There was eventual passage of thin liquid into the pharynx, which spilled into the larynx and was silently aspirated before onset of the swallow.  He did swallow a teaspoon of puree when mixed with thin liquids, demonstrating clearance through the pharynx and transfer through PES. No further swallows could be elicited and oral suctioning was necessary to remove a large amount of material from the oral cavity.  Primary obstacle to eating/drinking is cognition and the intermittent absence of initiation of the swallow sequence.  Silent aspiration continues to be present.  Ability to eat/swallow and protect  the airway will likely fluctuate from day-to-day. If family would like for him to resume some POs, small amounts of thin liquid and purees would be the recommended starting point. Continue NPO until results/recs discussed with family. SLP will f/u. Factors that may increase risk of adverse event in presence of aspiration [name] & [name] 2021): Factors that may increase risk of adverse event in presence of aspiration [name] & [name] 2021): Reduced cognitive function; Limited mobility; Frail or deconditioned; Dependence for feeding and/or oral hygiene; Weak cough; Presence of tubes (ETT, trach, NG, etc.); Aspiration of thick, dense, and/or acidic materials Recommendations/Plan: Swallowing Evaluation Recommendations Swallowing Evaluation Recommendations Recommendations: NPO Medication Administration: Via alternative means Treatment Plan Treatment Plan Functional status assessment: Patient has had a recent decline in their functional status and/or demonstrates limited ability to  make significant improvements in function in a reasonable and predictable amount of time. Treatment frequency: Min 2x/week Treatment duration: 2 weeks Interventions: Patient/family education Recommendations Recommendations for follow up therapy are one component of a multi-disciplinary discharge planning process, led by the attending physician.  Recommendations may be updated based on patient status, additional functional criteria and insurance authorization. Assessment: Orofacial Exam: Orofacial Exam Oral Cavity - Dentition: Poor condition; Missing dentition Anatomy: Anatomy: WFL Boluses Administered: Boluses Administered Boluses Administered: Thin liquids (Level 0); Puree  Oral Impairment Domain: Oral Impairment Domain Lip Closure: Escape beyond mid-chin Tongue control during bolus hold: Not tested Bolus preparation/mastication: -- (mechanical solids not offered) Bolus transport/lingual motion: Minimal-no tongue motion Oral residue:  Minimal to no clearance Location of oral residue : Floor of mouth; Tongue; Lateral sulci Initiation of pharyngeal swallow : Pyriform sinuses  Pharyngeal Impairment Domain: Pharyngeal Impairment Domain Soft palate elevation: No bolus between soft palate (SP)/pharyngeal wall (PW) Laryngeal elevation: Partial superior movement of thyroid cartilage/partial approximation of arytenoids to epiglottic petiole Anterior hyoid excursion: Complete anterior movement Epiglottic movement: Complete inversion Laryngeal vestibule closure: Incomplete, narrow column air/contrast in laryngeal vestibule Pharyngeal stripping wave : Present - complete Pharyngeal contraction (A/P view only): N/A Pharyngoesophageal segment opening: Partial distention/partial duration, partial obstruction of flow Tongue base retraction: Trace column of contrast or air between tongue base and PPW Pharyngeal residue: Complete pharyngeal clearance  Esophageal Impairment Domain: Esophageal Impairment Domain Esophageal clearance upright position: -- (n/a) Pill: Pill Consistency administered: -- (n/a) Penetration/Aspiration Scale Score: Penetration/Aspiration Scale Score 1.  Material does not enter airway: Puree 8.  Material enters airway, passes BELOW cords without attempt by patient to eject out (silent aspiration) : Thin liquids (Level 0) Compensatory Strategies: Compensatory Strategies Compensatory strategies: -- (pt unable to carry out compensatory strategies)   General Information: Caregiver present: No  Diet Prior to this Study: NPO; G-tube   Temperature : Normal   Respiratory Status: WFL   No data recorded  History of Recent Intubation: No  Behavior/Cognition: Alert No data recorded Baseline vocal quality/speech: Hypophonia/low volume Volitional Cough: Unable to elicit Volitional Swallow: Unable to elicit No data recorded Goal Planning: Prognosis for improved oropharyngeal function: Guarded Barriers to Reach Goals: Cognitive deficits No data recorded  Patient/Family Stated Goal: pt wants pizza No data recorded Pain: Pain Assessment Pain Assessment: No/denies pain End of Session: Start Time:SLP Start Time (ACUTE ONLY): 1300 Stop Time: SLP Stop Time (ACUTE ONLY): 1325 Time Calculation:SLP Time Calculation (min) (ACUTE ONLY): 25 min Charges: SLP Evaluations $ SLP Speech Visit: 1 Visit SLP Evaluations $MBS Swallow: 1 Procedure $Swallowing Treatment: 1 Procedure SLP visit diagnosis: SLP Visit Diagnosis: Dysphagia, oropharyngeal phase (R13.12) Past Medical History: Past Medical History: Diagnosis Date  Dementia (HCC)   Hypertension   Pneumonia   Stroke [facility])  Past Surgical History: Past Surgical History: Procedure Laterality Date  LAPAROSCOPIC GASTROSTOMY N/A 06/07/2023  Procedure: CREATION, GASTROSTOMY, LAPAROSCOPIC;  Surgeon: Kinsinger, De Blanch, MD;  Location: [facility] OR;  Service: General;  Laterality: N/A;  PEG PLACEMENT N/A 06/07/2023  Procedure: INSERTION, PEG TUBE;  Surgeon: Rodman Pickle, MD;  Location: [facility] OR;  Service: General;  Laterality: N/A;  POSSIBLE GASTROSTOMY Carolan Shiver 06/09/2023, 2:01 PM Amanda L. Samson Frederic, MA CCC/SLP Clinical Specialist - Acute Care SLP Acute Rehabilitation Services Office number [phone]  CT ABDOMEN WO CONTRAST Result Date: 06/03/2023 CLINICAL DATA:  Evaluation anatomy for possible percutaneous gastrostomy tube placement. EXAM: CT ABDOMEN WITHOUT CONTRAST TECHNIQUE: Multidetector CT imaging of the abdomen was performed following the  standard protocol without IV contrast. RADIATION DOSE REDUCTION: This exam was performed according to the departmental dose-optimization program which includes automated exposure control, adjustment of the mA and/or kV according to patient size and/or use of iterative reconstruction technique. COMPARISON:  CT of the chest on 05/17/2023 FINDINGS: Lower chest: Mild bibasilar atelectasis. Hepatobiliary: Unremarkable liver. Calcified gallstone in the gallbladder neck. No evidence of  gallbladder distension or biliary ductal dilatation. Pancreas: Unremarkable. No pancreatic ductal dilatation or surrounding inflammatory changes. Spleen: Normal in size without focal abnormality. Adrenals/Urinary Tract: Adrenal glands are unremarkable. Kidneys are normal, without renal calculi, focal lesion, or hydronephrosis. 6 cm exophytic Bosniak 1 cyst of the left kidney requires no follow-up. Stomach/Bowel: No hiatal hernia. A feeding tube within the distal esophagus and stomach terminates in the descending duodenum at the juncture of the second and third portions. There is interposition of the splenic flexure of the colon and transverse colon between the stomach and gastric wall with no percutaneous window for gastrostomy tube placement. The stomach is also very high in position. Anatomy is not amenable to percutaneous gastrostomy tube placement. No evidence of bowel obstruction, significant ileus or free intraperitoneal air. Vascular/Lymphatic: Atherosclerosis of the abdominal aorta without aneurysm. No enlarged lymph nodes identified. Other: No ascites or abnormal focal fluid collection. No abdominal wall hernia. Musculoskeletal: No acute or significant osseous findings. IMPRESSION: 1. Anatomy is not amenable to percutaneous gastrostomy tube placement. There is interposition of the splenic flexure of the colon and transverse colon between the stomach and gastric wall with no percutaneous window for gastrostomy tube placement. The stomach is also very high in position. 2. Cholelithiasis. 3. Aortic atherosclerosis. Electronically Signed   By: Irish Lack M.D.   On: 06/03/2023 16:10   DG Swallowing Func-Speech Pathology Result Date: 05/30/2023 Modified Barium Swallow Study Patient Details Name: PARKE JANDREAU MRN: [id number] Date of Birth: 09-09-1940 Today's Date: 05/30/2023 HPI/PMH: HPI: Patient is an 83 y.o. male with PMH: dementia, recurrent UTI's, CVA, HTN. He presented to the hospital on 05/11/23 as a  code stroke with speech deficits and left sided weakness. 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 Patient has been NPO; Cortrak placed 2/21. He failed [facility] swallow screen with RN. Clinical Impression: Clinical Impression: Pt was a little more alert for MBS, but did not initiate any swallowing. Physical assistance and repositioning of chair were used to hold his head up in a more neutral position to keep boluses in his oral cavity better, as he would take liquids off the spoon but then they would spill anteriorly out of his oral cavity. When positioned so that they could stay in his oral cavity he still had incomplete labial seal. No lingual movements were noted to attempt bolus transit. Max cues were given, including from male student's voice, to whom he responded better overall. A small amount of nectar thick liquids spilled back into his pharynx and then into his larynx. Delayed coughing was suggestive of some sensation, but no clear ejection of aspirated material was observed. SLP suctioned his oral cavity to remove the remaining barium. SLP not able to recommend a PO diet at this time. Given persistent lethargy seen this admission, there would be ongoing concern for adequate nutrition and hydration even if he had been able to swallow more on this study. Factors that may increase risk of adverse event in presence of aspiration [name] & [name] 2021):  Factors that may increase risk of adverse event in presence of aspiration [name] & [name] 2021): Reduced cognitive function; Limited mobility; Frail or deconditioned; Dependence for feeding and/or oral hygiene; Weak cough; Presence of tubes (ETT, trach, NG, etc.); Aspiration of thick, dense, and/or acidic materials Recommendations/Plan: Swallowing Evaluation Recommendations Swallowing Evaluation Recommendations Recommendations: NPO Medication  Administration: Via alternative means Treatment Plan Treatment Plan Follow-up recommendations: Skilled nursing-short term rehab (<3 hours/day) Functional status assessment: Patient has had a recent decline in their functional status and/or demonstrates limited ability to make significant improvements in function in a reasonable and predictable amount of time. Treatment frequency: Min 2x/week Treatment duration: 2 weeks Interventions: Aspiration precaution training; Patient/family education; Trials of upgraded texture/liquids Recommendations Recommendations for follow up therapy are one component of a multi-disciplinary discharge planning process, led by the attending physician.  Recommendations may be updated based on patient status, additional functional criteria and insurance authorization. Assessment: Orofacial Exam: Orofacial Exam Oral Cavity - Dentition: Poor condition; Missing dentition Anatomy: Anatomy: WFL Boluses Administered: Boluses Administered Boluses Administered: Thin liquids (Level 0); Mildly thick liquids (Level 2, nectar thick)  Oral Impairment Domain: Oral Impairment Domain Lip Closure: Escape beyond mid-chin Bolus transport/lingual motion: Minimal-no tongue motion Oral residue: Minimal to no clearance Location of oral residue : Floor of mouth; Tongue; Lateral sulci Initiation of pharyngeal swallow : No visible initiation at any location  Pharyngeal Impairment Domain: No data recorded Esophageal Impairment Domain: No data recorded Pill: No data recorded Penetration/Aspiration Scale Score: Penetration/Aspiration Scale Score 7.  Material enters airway, passes BELOW cords and not ejected out despite cough attempt by patient: Mildly thick liquids (Level 2, nectar thick) Compensatory Strategies: No data recorded  General Information: Caregiver present: No  Diet Prior to this Study: NPO; Cortrak/Small bore NG tube   Temperature : Normal   Respiratory Status: WFL   Supplemental O2: Nasal cannula    History of Recent Intubation: No  Behavior/Cognition: Lethargic/Drowsy; Requires cueing; Cooperative Self-Feeding Abilities: Dependent for feeding Baseline vocal quality/speech: Hypophonia/low volume No data recorded Volitional Swallow: Unable to elicit Exam Limitations: Other (comment) (lethargy, although this has been persistent this admission) Goal Planning: Prognosis for improved oropharyngeal function: Fair Barriers to Reach Goals: Cognitive deficits No data recorded Patient/Family Stated Goal: family hopeful for improvement Consulted and agree with results and recommendations: Pt unable/family or caregiver not available; Nurse; Advanced practice provider Pain: Pain Assessment Pain Assessment: Faces Faces Pain Scale: 0 Breathing: 0 Negative Vocalization: 0 Facial Expression: 0 Body Language: 0 Consolability: 0 PAINAD Score: 0 End of Session: Start Time:SLP Start Time (ACUTE ONLY): 0959 Stop Time: SLP Stop Time (ACUTE ONLY): 1020 Time Calculation:SLP Time Calculation (min) (ACUTE ONLY): 21 min Charges: SLP Evaluations $ SLP Speech Visit: 1 Visit SLP Evaluations $MBS Swallow: 1 Procedure $Swallowing Treatment: 1 Procedure SLP visit diagnosis: SLP Visit Diagnosis: Dysphagia, oropharyngeal phase (R13.12) Past Medical History: No past medical history on file. Past Surgical History: No past surgical history on file. [name]., M.A. CCC-SLP Acute Rehabilitation Services Office [phone] Secure chat preferred 05/30/2023, 12:49 PM  DG CHEST PORT 1 VIEW Result Date: 05/27/2023 CLINICAL DATA:  Pneumonia. EXAM: PORTABLE CHEST 1 VIEW COMPARISON:  05/14/2023, CT 05/17/2023 FINDINGS: Weighted enteric tube below the diaphragm not included in the field of view. Normal heart size with stable mediastinal contours. Mild ill-defined bibasilar opacities. No pneumothorax, large pleural effusion or pulmonary edema. Patient is rotated. IMPRESSION: Mild ill-defined bibasilar opacities, may represent atelectasis or pneumonia.  Electronically Signed   By: Ivette Loyal.D.  On: 05/27/2023 19:17       The results of significant diagnostics from this hospitalization (including imaging, microbiology, ancillary and laboratory) are listed below for reference.     Microbiology: No results found for this or any previous visit (from the past 240 hours).   Labs:  CBC: Recent Labs  Lab 06/22/23 1743  WBC 10.1  HGB 12.5*  HCT 36.0*  MCV 84.5  PLT 498*   BMP &GFR Recent Labs  Lab 06/22/23 1743  NA 137  K 3.9  CL 101  CO2 26  GLUCOSE 124*  BUN 35*  CREATININE 0.76  CALCIUM 9.9  MG 2.2   Estimated Creatinine Clearance: 63.4 mL/min (by C-G formula based on SCr of 0.76 mg/dL). Liver & Pancreas: Recent Labs  Lab 06/22/23 1743  AST 20  ALT 20  ALKPHOS 84  BILITOT 0.5  PROT 7.2  ALBUMIN 2.8*   No results for input(s): "LIPASE", "AMYLASE" in the last 168 hours. No results for input(s): "AMMONIA" in the last 168 hours. Diabetic: No results for input(s): "HGBA1C" in the last 72 hours. Recent Labs  Lab 06/22/23 0009 06/22/23 0746 06/22/23 1155 06/22/23 1636 06/22/23 2013  GLUCAP 139* 145* 140* 135* 144*   Cardiac Enzymes: No results for input(s): "CKTOTAL", "CKMB", "CKMBINDEX", "TROPONINI" in the last 168 hours. No results for input(s): "PROBNP" in the last 8760 hours. Coagulation Profile: No results for input(s): "INR", "PROTIME" in the last 168 hours. Thyroid Function Tests: Recent Labs    06/22/23 1743  TSH 3.856   Lipid Profile: No results for input(s): "CHOL", "HDL", "LDLCALC", "TRIG", "CHOLHDL", "LDLDIRECT" in the last 72 hours. Anemia Panel: No results for input(s): "VITAMINB12", "FOLATE", "FERRITIN", "TIBC", "IRON", "RETICCTPCT" in the last 72 hours. Urine analysis:    Component Value Date/Time   COLORURINE YELLOW 05/17/2023 1430   APPEARANCEUR CLEAR 05/17/2023 1430   APPEARANCEUR Clear 07/23/2021 1535   LABSPEC 1.026 05/17/2023 1430   PHURINE 5.0 05/17/2023 1430    GLUCOSEU NEGATIVE 05/17/2023 1430   HGBUR NEGATIVE 05/17/2023 1430   BILIRUBINUR NEGATIVE 05/17/2023 1430   BILIRUBINUR 1+ 05/04/2023 1015   BILIRUBINUR Negative 07/23/2021 1535   KETONESUR NEGATIVE 05/17/2023 1430   PROTEINUR NEGATIVE 05/17/2023 1430   UROBILINOGEN 0.2 (A) 05/04/2023 1015   NITRITE NEGATIVE 05/17/2023 1430   LEUKOCYTESUR NEGATIVE 05/17/2023 1430   Sepsis Labs: Invalid input(s): "PROCALCITONIN", "LACTICIDVEN"   SIGNED:  Almon Hercules, MD  [facility] 06/23/2023, 7:36 AM

## 2023-06-24 DIAGNOSIS — E43 Unspecified severe protein-calorie malnutrition: Secondary | ICD-10-CM | POA: Diagnosis not present

## 2023-06-24 DIAGNOSIS — M6281 Muscle weakness (generalized): Secondary | ICD-10-CM | POA: Diagnosis not present

## 2023-06-24 DIAGNOSIS — R1310 Dysphagia, unspecified: Secondary | ICD-10-CM | POA: Diagnosis not present

## 2023-06-24 DIAGNOSIS — I639 Cerebral infarction, unspecified: Secondary | ICD-10-CM | POA: Diagnosis not present

## 2023-06-24 DIAGNOSIS — R2689 Other abnormalities of gait and mobility: Secondary | ICD-10-CM | POA: Diagnosis not present

## 2023-06-24 DIAGNOSIS — Z931 Gastrostomy status: Secondary | ICD-10-CM | POA: Diagnosis not present

## 2023-06-24 DIAGNOSIS — I610 Nontraumatic intracerebral hemorrhage in hemisphere, subcortical: Secondary | ICD-10-CM | POA: Diagnosis not present

## 2023-06-24 DIAGNOSIS — J690 Pneumonitis due to inhalation of food and vomit: Secondary | ICD-10-CM | POA: Diagnosis not present

## 2023-06-24 DIAGNOSIS — D849 Immunodeficiency, unspecified: Secondary | ICD-10-CM | POA: Diagnosis not present

## 2023-06-24 DIAGNOSIS — R000 Tachycardia, unspecified: Secondary | ICD-10-CM | POA: Diagnosis not present

## 2023-06-24 DIAGNOSIS — I10 Essential (primary) hypertension: Secondary | ICD-10-CM | POA: Diagnosis not present

## 2023-06-27 DIAGNOSIS — R1310 Dysphagia, unspecified: Secondary | ICD-10-CM | POA: Diagnosis not present

## 2023-06-27 DIAGNOSIS — J690 Pneumonitis due to inhalation of food and vomit: Secondary | ICD-10-CM | POA: Diagnosis not present

## 2023-06-27 DIAGNOSIS — Z931 Gastrostomy status: Secondary | ICD-10-CM | POA: Diagnosis not present

## 2023-06-27 DIAGNOSIS — I610 Nontraumatic intracerebral hemorrhage in hemisphere, subcortical: Secondary | ICD-10-CM | POA: Diagnosis not present

## 2023-06-27 DIAGNOSIS — I10 Essential (primary) hypertension: Secondary | ICD-10-CM | POA: Diagnosis not present

## 2023-06-27 DIAGNOSIS — R000 Tachycardia, unspecified: Secondary | ICD-10-CM | POA: Diagnosis not present

## 2023-06-28 DIAGNOSIS — I639 Cerebral infarction, unspecified: Secondary | ICD-10-CM | POA: Diagnosis not present

## 2023-06-28 DIAGNOSIS — D849 Immunodeficiency, unspecified: Secondary | ICD-10-CM | POA: Diagnosis not present

## 2023-06-28 DIAGNOSIS — R509 Fever, unspecified: Secondary | ICD-10-CM | POA: Diagnosis not present

## 2023-06-28 DIAGNOSIS — R000 Tachycardia, unspecified: Secondary | ICD-10-CM | POA: Diagnosis not present

## 2023-06-28 DIAGNOSIS — Z931 Gastrostomy status: Secondary | ICD-10-CM | POA: Diagnosis not present

## 2023-06-28 DIAGNOSIS — E43 Unspecified severe protein-calorie malnutrition: Secondary | ICD-10-CM | POA: Diagnosis not present

## 2023-06-28 DIAGNOSIS — R2689 Other abnormalities of gait and mobility: Secondary | ICD-10-CM | POA: Diagnosis not present

## 2023-06-28 DIAGNOSIS — R1310 Dysphagia, unspecified: Secondary | ICD-10-CM | POA: Diagnosis not present

## 2023-06-28 DIAGNOSIS — R0602 Shortness of breath: Secondary | ICD-10-CM | POA: Diagnosis not present

## 2023-06-28 DIAGNOSIS — J690 Pneumonitis due to inhalation of food and vomit: Secondary | ICD-10-CM | POA: Diagnosis not present

## 2023-06-28 DIAGNOSIS — I10 Essential (primary) hypertension: Secondary | ICD-10-CM | POA: Diagnosis not present

## 2023-06-28 DIAGNOSIS — M6281 Muscle weakness (generalized): Secondary | ICD-10-CM | POA: Diagnosis not present

## 2023-06-28 DIAGNOSIS — I610 Nontraumatic intracerebral hemorrhage in hemisphere, subcortical: Secondary | ICD-10-CM | POA: Diagnosis not present

## 2023-06-28 DIAGNOSIS — R0989 Other specified symptoms and signs involving the circulatory and respiratory systems: Secondary | ICD-10-CM | POA: Diagnosis not present

## 2023-06-30 DIAGNOSIS — I10 Essential (primary) hypertension: Secondary | ICD-10-CM | POA: Diagnosis not present

## 2023-06-30 DIAGNOSIS — R1310 Dysphagia, unspecified: Secondary | ICD-10-CM | POA: Diagnosis not present

## 2023-06-30 DIAGNOSIS — I610 Nontraumatic intracerebral hemorrhage in hemisphere, subcortical: Secondary | ICD-10-CM | POA: Diagnosis not present

## 2023-06-30 DIAGNOSIS — Z931 Gastrostomy status: Secondary | ICD-10-CM | POA: Diagnosis not present

## 2023-06-30 DIAGNOSIS — J690 Pneumonitis due to inhalation of food and vomit: Secondary | ICD-10-CM | POA: Diagnosis not present

## 2023-07-01 DIAGNOSIS — M6281 Muscle weakness (generalized): Secondary | ICD-10-CM | POA: Diagnosis not present

## 2023-07-01 DIAGNOSIS — R2689 Other abnormalities of gait and mobility: Secondary | ICD-10-CM | POA: Diagnosis not present

## 2023-07-01 DIAGNOSIS — J690 Pneumonitis due to inhalation of food and vomit: Secondary | ICD-10-CM | POA: Diagnosis not present

## 2023-07-01 DIAGNOSIS — R509 Fever, unspecified: Secondary | ICD-10-CM | POA: Diagnosis not present

## 2023-07-01 DIAGNOSIS — R000 Tachycardia, unspecified: Secondary | ICD-10-CM | POA: Diagnosis not present

## 2023-07-01 DIAGNOSIS — D849 Immunodeficiency, unspecified: Secondary | ICD-10-CM | POA: Diagnosis not present

## 2023-07-01 DIAGNOSIS — I10 Essential (primary) hypertension: Secondary | ICD-10-CM | POA: Diagnosis not present

## 2023-07-01 DIAGNOSIS — I610 Nontraumatic intracerebral hemorrhage in hemisphere, subcortical: Secondary | ICD-10-CM | POA: Diagnosis not present

## 2023-07-01 DIAGNOSIS — Z931 Gastrostomy status: Secondary | ICD-10-CM | POA: Diagnosis not present

## 2023-07-01 DIAGNOSIS — E43 Unspecified severe protein-calorie malnutrition: Secondary | ICD-10-CM | POA: Diagnosis not present

## 2023-07-01 DIAGNOSIS — R1310 Dysphagia, unspecified: Secondary | ICD-10-CM | POA: Diagnosis not present

## 2023-07-01 DIAGNOSIS — I639 Cerebral infarction, unspecified: Secondary | ICD-10-CM | POA: Diagnosis not present

## 2023-07-03 ENCOUNTER — Emergency Department
Admission: EM | Admit: 2023-07-03 | Discharge: 2023-07-03 | Disposition: A | Discharge status: Skilled Nursing Facility | Attending: Emergency Medicine | Admitting: Emergency Medicine

## 2023-07-03 ENCOUNTER — Other Ambulatory Visit: Payer: Self-pay

## 2023-07-03 ENCOUNTER — Emergency Department

## 2023-07-03 DIAGNOSIS — M25532 Pain in left wrist: Secondary | ICD-10-CM | POA: Diagnosis not present

## 2023-07-03 DIAGNOSIS — R519 Headache, unspecified: Secondary | ICD-10-CM | POA: Diagnosis not present

## 2023-07-03 DIAGNOSIS — W19XXXA Unspecified fall, initial encounter: Secondary | ICD-10-CM | POA: Diagnosis not present

## 2023-07-03 DIAGNOSIS — Z043 Encounter for examination and observation following other accident: Secondary | ICD-10-CM | POA: Diagnosis not present

## 2023-07-03 DIAGNOSIS — R9082 White matter disease, unspecified: Secondary | ICD-10-CM | POA: Diagnosis not present

## 2023-07-03 DIAGNOSIS — F0390 Unspecified dementia without behavioral disturbance: Secondary | ICD-10-CM | POA: Insufficient documentation

## 2023-07-03 DIAGNOSIS — M1812 Unilateral primary osteoarthritis of first carpometacarpal joint, left hand: Secondary | ICD-10-CM | POA: Diagnosis not present

## 2023-07-03 DIAGNOSIS — M25512 Pain in left shoulder: Secondary | ICD-10-CM | POA: Diagnosis not present

## 2023-07-03 DIAGNOSIS — G9389 Other specified disorders of brain: Secondary | ICD-10-CM | POA: Diagnosis not present

## 2023-07-03 DIAGNOSIS — M25562 Pain in left knee: Secondary | ICD-10-CM | POA: Diagnosis not present

## 2023-07-03 DIAGNOSIS — M19012 Primary osteoarthritis, left shoulder: Secondary | ICD-10-CM | POA: Diagnosis not present

## 2023-07-03 LAB — COMPREHENSIVE METABOLIC PANEL WITH GFR
ALT: 18 U/L (ref 0–44)
AST: 19 U/L (ref 15–41)
Albumin: 3.1 g/dL — ABNORMAL LOW (ref 3.5–5.0)
Alkaline Phosphatase: 79 U/L (ref 38–126)
Anion gap: 11 (ref 5–15)
BUN: 24 mg/dL — ABNORMAL HIGH (ref 8–23)
CO2: 27 mmol/L (ref 22–32)
Calcium: 9.5 mg/dL (ref 8.9–10.3)
Chloride: 101 mmol/L (ref 98–111)
Creatinine, Ser: 0.63 mg/dL (ref 0.61–1.24)
GFR, Estimated: >60 mL/min (ref >60)
Glucose, Bld: 124 mg/dL — ABNORMAL HIGH (ref 70–99)
Potassium: 3.4 mmol/L — ABNORMAL LOW (ref 3.5–5.1)
Sodium: 139 mmol/L (ref 135–145)
Total Bilirubin: 0.4 mg/dL (ref 0.0–1.2)
Total Protein: 7.9 g/dL (ref 6.5–8.1)

## 2023-07-03 LAB — URINALYSIS, ROUTINE W REFLEX MICROSCOPIC
Bilirubin Urine: NEGATIVE
Glucose, UA: NEGATIVE mg/dL
Hgb urine dipstick: NEGATIVE
Ketones, ur: NEGATIVE mg/dL
Leukocytes,Ua: NEGATIVE
Nitrite: NEGATIVE
Protein, ur: NEGATIVE mg/dL
Specific Gravity, Urine: 1.01 (ref 1.005–1.030)
pH: 7 (ref 5.0–8.0)

## 2023-07-03 LAB — CBC WITH DIFFERENTIAL/PLATELET
Abs Immature Granulocytes: 0.03 10*3/uL (ref 0.00–0.07)
Basophils Absolute: 0 10*3/uL (ref 0.0–0.1)
Basophils Relative: 0 %
Eosinophils Absolute: 0.2 10*3/uL (ref 0.0–0.5)
Eosinophils Relative: 2 %
HCT: 36.5 % — ABNORMAL LOW (ref 39.0–52.0)
Hemoglobin: 12.7 g/dL — ABNORMAL LOW (ref 13.0–17.0)
Immature Granulocytes: 0 %
Lymphocytes Relative: 29 %
Lymphs Abs: 2.9 10*3/uL (ref 0.7–4.0)
MCH: 29.5 pg (ref 26.0–34.0)
MCHC: 34.8 g/dL (ref 30.0–36.0)
MCV: 84.9 fL (ref 80.0–100.0)
Monocytes Absolute: 1.1 10*3/uL — ABNORMAL HIGH (ref 0.1–1.0)
Monocytes Relative: 10 %
Neutro Abs: 5.9 10*3/uL (ref 1.7–7.7)
Neutrophils Relative %: 59 %
Platelets: 418 10*3/uL — ABNORMAL HIGH (ref 150–400)
RBC: 4.3 MIL/uL (ref 4.22–5.81)
RDW: 15.3 % (ref 11.5–15.5)
WBC: 10.1 10*3/uL (ref 4.0–10.5)
nRBC: 0 % (ref 0.0–0.2)

## 2023-07-03 MED ORDER — ACETAMINOPHEN 160 MG/5ML PO SOLN: 320.0000 mg | Freq: Four times a day (QID) | ORAL | 0 refills | Status: DC | PRN

## 2023-07-03 MED ORDER — IBUPROFEN 600 MG PO TABS
600.0000 mg | ORAL_TABLET | Freq: Once | ORAL | Status: AC
  Administered 2023-07-03: 600 mg via ORAL
  Filled 2023-07-03: qty 1

## 2023-07-03 NOTE — ED Triage Notes (Addendum)
 Pt comes via [facility] from [facility] with c/o fall. Pt states left knee pain, left wrist, arm and shoulder. Pt does have place to left cheek. Pt denies any loc or thinners.  Pt does have dementia and is at baseline.

## 2023-07-03 NOTE — Discharge Instructions (Addendum)
 You have been diagnosed with fall, left shoulder pain, left wrist pain, left knee pain.  Please take acetaminophen solution 10 mL using the G-tube every 6 hours.  Come back to ED or go to your PCP if you have new symptoms or symptoms worsen.

## 2023-07-03 NOTE — ED Notes (Addendum)
 Please call patients son, Cavion Faiola, [phone], when patient goes back to rehab facility

## 2023-07-03 NOTE — ED Provider Notes (Signed)
 [facility] Provider Note    Event Date/Time   First MD Initiated Contact with Patient 07/03/23 1556     (approximate)   History   Fall    HPI  Derek Yates is a 83 y.o. male   with a past medical history of dementia, UTI, CVA, who presents to the ED complaining of left shoulder, left elbow, left wrist, left knee pain after falling this morning. According to the patient's son, he notices patient is confused in the last 2 days.  Today patient was crawling from bed and fell, no witnesses, son denies patient had loss of consciousness.       Physical Exam   Triage Vital Signs: ED Triage Vitals  Encounter Vitals Group     BP 07/03/23 1510 131/71     Systolic BP Percentile --      Diastolic BP Percentile --      Pulse Rate 07/03/23 1510 95     Resp 07/03/23 1510 18     Temp 07/03/23 1510 98 F (36.7 C)     Temp src --      SpO2 07/03/23 1510 98 %     Weight --      Height --      Head Circumference --      Peak Flow --      Pain Score 07/03/23 1504 5     Pain Loc --      Pain Education --      Exclude from Growth Chart --     Most recent vital signs: Vitals:   07/03/23 1510 07/03/23 1853  BP: 131/71 114/72  Pulse: 95 84  Resp: 18 18  Temp: 98 F (36.7 C)   SpO2: 98% 95%     Constitutional: Alert, NAD. Able to speak in complete sentences without cough or dyspnea.  Patient oriented x 4 Eyes: Conjunctivae are normal.  Head: Atraumatic. Nose: No congestion/rhinnorhea. Mouth/Throat: Mucous membranes are moist.   Neck: Painless ROM. Supple. No JVD, nodes, thyromegaly  Cardiovascular:   Good peripheral circulation.RRR no murmurs, gallops, rubs  Respiratory: Normal respiratory effort.  No retractions. Clear to auscultation bilaterally without wheezing or crackles  Gastrointestinal: Soft and nontender.  Musculoskeletal:  no deformity l Left shoulder: Skin intact no ecchymosis no hematomas, tender to palpation at the level of  acromioclavicular  joint. Left elbow: Skin intact no ecchymosis, hematomas, full ROM Left wrist: Skin intact, ecchymosis at the level of the distal ulna.  Full ROM.  Strength 4/5, sensation intact Neurologic:  MAE spontaneously. No gross focal neurologic deficits are appreciated.  Skin:  Skin is warm, dry and intact. No rash noted. Psychiatric: Mood and affect are normal. Speech and behavior are normal.    ED Results / Procedures / Treatments   Labs (all labs ordered are listed, but only abnormal results are displayed) Labs Reviewed  COMPREHENSIVE METABOLIC PANEL WITH GFR - Abnormal; Notable for the following components:      Result Value   Potassium 3.4 (*)    Glucose, Bld 124 (*)    BUN 24 (*)    Albumin 3.1 (*)    All other components within normal limits  CBC WITH DIFFERENTIAL/PLATELET - Abnormal; Notable for the following components:   Hemoglobin 12.7 (*)    HCT 36.5 (*)    Platelets 418 (*)    Monocytes Absolute 1.1 (*)    All other components within normal limits  URINALYSIS, ROUTINE W REFLEX MICROSCOPIC - Abnormal;  Notable for the following components:   Color, Urine STRAW (*)    APPearance CLEAR (*)    All other components within normal limits     EKG     RADIOLOGY I independently reviewed and interpreted imaging and agree with radiologists findings.      PROCEDURES:  Critical Care performed:   Procedures   MEDICATIONS ORDERED IN ED: Medications  ibuprofen (ADVIL) tablet 600 mg (600 mg Oral Given 07/03/23 1701)   Clinical Course as of 07/03/23 1927  Sun Jul 03, 2023  1620 DG Shoulder Left . No fracture or dislocation. 2. Mild degenerative changes of the acromioclavicular joint.   [AE]  1620 DG Wrist Complete Left  No acute fracture or dislocation. 2. Severe degenerative changes of the first carpometacarpal joint.   [AE]  1620 DG Knee Complete 4 Views Left 1. No acute fracture or dislocation. 2. Peripheral vascular calcifications.   [AE]   1703 CT HEAD WO CONTRAST ( )  No acute intracranial pathology. 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 2. No displaced fractures or dislocations of the facial bones. 3. No fracture or static subluxation of the cervical spine.   [AE]  1915 Urinalysis, Routine w reflex microscopic -Urine, Clean Catch(!) Within normal limits [AE]  1921 Comprehensive metabolic panel(!) Potassium 3.4, albumin 3.1 [AE]  1921 CBC with Differential(!) Hemoglobin 12.7 [AE]    Clinical Course User Index [AE] Awilda Lennox, PA-C    IMPRESSION / MDM / ASSESSMENT AND PLAN / ED COURSE  I reviewed the triage vital signs and the nursing notes.  Differential diagnosis includes, but is not limited to, UTI, electrolytes imbalance, fall, fracture, muscle strain  [name] presentation is most consistent with acute complicated illness / injury requiring diagnostic workup.   [name] diagnosis is consistent with fall, left shoulder pain, left wrist pain, left knee pain. I independently reviewed and interpreted imaging and agree with radiologists findings: No fracture or dislocation. Labs are  reassuring rolling out UTI, infection, electrolyte imbalance. . I did review the patient's allergies and medications.The patient is in stable and satisfactory condition for discharge home  Patient will be discharged home with prescriptions for acetaminophen suspension. Patient is to follow up with PCP as needed or otherwise directed. Patient is given ED precautions to return to the ED for any worsening or new symptoms. Discussed plan of care with patient and family, answered all of patient's and family's questions, Patient and family agreeable to plan of care. Patient verbalized understanding.    FINAL CLINICAL IMPRESSION(S) / ED DIAGNOSES   Final diagnoses:  Fall, initial encounter  Acute pain of left shoulder  Left wrist pain  Acute pain of left knee      Rx / DC Orders   ED Discharge Orders          Ordered    acetaminophen (TYLENOL) 160 MG/5ML solution  Every 6 hours PRN,   Status:  Discontinued        07/03/23 1925    acetaminophen (TYLENOL) 160 MG/5ML solution  Every 6 hours PRN        07/03/23 1926             Note:  This document was prepared using Dragon voice recognition software and may include unintentional dictation errors.   Awilda Lennox, PA-C 07/03/23 1927    Bryson Carbine, MD 07/07/23 [phone]

## 2023-07-04 DIAGNOSIS — R1310 Dysphagia, unspecified: Secondary | ICD-10-CM | POA: Diagnosis not present

## 2023-07-04 DIAGNOSIS — Z931 Gastrostomy status: Secondary | ICD-10-CM | POA: Diagnosis not present

## 2023-07-04 DIAGNOSIS — R000 Tachycardia, unspecified: Secondary | ICD-10-CM | POA: Diagnosis not present

## 2023-07-04 DIAGNOSIS — R296 Repeated falls: Secondary | ICD-10-CM | POA: Diagnosis not present

## 2023-07-04 DIAGNOSIS — R509 Fever, unspecified: Secondary | ICD-10-CM | POA: Diagnosis not present

## 2023-07-04 DIAGNOSIS — J690 Pneumonitis due to inhalation of food and vomit: Secondary | ICD-10-CM | POA: Diagnosis not present

## 2023-07-04 DIAGNOSIS — I610 Nontraumatic intracerebral hemorrhage in hemisphere, subcortical: Secondary | ICD-10-CM | POA: Diagnosis not present

## 2023-07-04 DIAGNOSIS — I10 Essential (primary) hypertension: Secondary | ICD-10-CM | POA: Diagnosis not present

## 2023-07-05 DIAGNOSIS — M6281 Muscle weakness (generalized): Secondary | ICD-10-CM | POA: Diagnosis not present

## 2023-07-05 DIAGNOSIS — I639 Cerebral infarction, unspecified: Secondary | ICD-10-CM | POA: Diagnosis not present

## 2023-07-05 DIAGNOSIS — R2689 Other abnormalities of gait and mobility: Secondary | ICD-10-CM | POA: Diagnosis not present

## 2023-07-05 DIAGNOSIS — D849 Immunodeficiency, unspecified: Secondary | ICD-10-CM | POA: Diagnosis not present

## 2023-07-05 DIAGNOSIS — E43 Unspecified severe protein-calorie malnutrition: Secondary | ICD-10-CM | POA: Diagnosis not present

## 2023-07-07 DIAGNOSIS — I610 Nontraumatic intracerebral hemorrhage in hemisphere, subcortical: Secondary | ICD-10-CM | POA: Diagnosis not present

## 2023-07-07 DIAGNOSIS — Z931 Gastrostomy status: Secondary | ICD-10-CM | POA: Diagnosis not present

## 2023-07-07 DIAGNOSIS — I10 Essential (primary) hypertension: Secondary | ICD-10-CM | POA: Diagnosis not present

## 2023-07-07 DIAGNOSIS — R000 Tachycardia, unspecified: Secondary | ICD-10-CM | POA: Diagnosis not present

## 2023-07-07 DIAGNOSIS — R1310 Dysphagia, unspecified: Secondary | ICD-10-CM | POA: Diagnosis not present

## 2023-07-07 DIAGNOSIS — J690 Pneumonitis due to inhalation of food and vomit: Secondary | ICD-10-CM | POA: Diagnosis not present

## 2023-07-08 DIAGNOSIS — I639 Cerebral infarction, unspecified: Secondary | ICD-10-CM | POA: Diagnosis not present

## 2023-07-08 DIAGNOSIS — E43 Unspecified severe protein-calorie malnutrition: Secondary | ICD-10-CM | POA: Diagnosis not present

## 2023-07-08 DIAGNOSIS — D849 Immunodeficiency, unspecified: Secondary | ICD-10-CM | POA: Diagnosis not present

## 2023-07-08 DIAGNOSIS — M6281 Muscle weakness (generalized): Secondary | ICD-10-CM | POA: Diagnosis not present

## 2023-07-08 DIAGNOSIS — R2689 Other abnormalities of gait and mobility: Secondary | ICD-10-CM | POA: Diagnosis not present

## 2023-07-12 DIAGNOSIS — R2689 Other abnormalities of gait and mobility: Secondary | ICD-10-CM | POA: Diagnosis not present

## 2023-07-12 DIAGNOSIS — D849 Immunodeficiency, unspecified: Secondary | ICD-10-CM | POA: Diagnosis not present

## 2023-07-12 DIAGNOSIS — M6281 Muscle weakness (generalized): Secondary | ICD-10-CM | POA: Diagnosis not present

## 2023-07-12 DIAGNOSIS — I639 Cerebral infarction, unspecified: Secondary | ICD-10-CM | POA: Diagnosis not present

## 2023-07-12 DIAGNOSIS — E43 Unspecified severe protein-calorie malnutrition: Secondary | ICD-10-CM | POA: Diagnosis not present

## 2023-07-14 DIAGNOSIS — Z931 Gastrostomy status: Secondary | ICD-10-CM | POA: Diagnosis not present

## 2023-07-14 DIAGNOSIS — R000 Tachycardia, unspecified: Secondary | ICD-10-CM | POA: Diagnosis not present

## 2023-07-14 DIAGNOSIS — I10 Essential (primary) hypertension: Secondary | ICD-10-CM | POA: Diagnosis not present

## 2023-07-14 DIAGNOSIS — I610 Nontraumatic intracerebral hemorrhage in hemisphere, subcortical: Secondary | ICD-10-CM | POA: Diagnosis not present

## 2023-07-14 DIAGNOSIS — R1310 Dysphagia, unspecified: Secondary | ICD-10-CM | POA: Diagnosis not present

## 2023-07-14 DIAGNOSIS — J690 Pneumonitis due to inhalation of food and vomit: Secondary | ICD-10-CM | POA: Diagnosis not present

## 2023-07-15 DIAGNOSIS — N390 Urinary tract infection, site not specified: Secondary | ICD-10-CM | POA: Diagnosis not present

## 2023-07-15 DIAGNOSIS — J690 Pneumonitis due to inhalation of food and vomit: Secondary | ICD-10-CM | POA: Diagnosis not present

## 2023-07-15 DIAGNOSIS — D72829 Elevated white blood cell count, unspecified: Secondary | ICD-10-CM | POA: Diagnosis not present

## 2023-07-15 DIAGNOSIS — R0989 Other specified symptoms and signs involving the circulatory and respiratory systems: Secondary | ICD-10-CM | POA: Diagnosis not present

## 2023-07-15 DIAGNOSIS — I639 Cerebral infarction, unspecified: Secondary | ICD-10-CM | POA: Diagnosis not present

## 2023-07-15 DIAGNOSIS — G8102 Flaccid hemiplegia affecting left dominant side: Secondary | ICD-10-CM | POA: Diagnosis not present

## 2023-07-15 DIAGNOSIS — R000 Tachycardia, unspecified: Secondary | ICD-10-CM | POA: Diagnosis not present

## 2023-07-15 DIAGNOSIS — M6281 Muscle weakness (generalized): Secondary | ICD-10-CM | POA: Diagnosis not present

## 2023-07-15 DIAGNOSIS — D849 Immunodeficiency, unspecified: Secondary | ICD-10-CM | POA: Diagnosis not present

## 2023-07-15 DIAGNOSIS — R2689 Other abnormalities of gait and mobility: Secondary | ICD-10-CM | POA: Diagnosis not present

## 2023-07-15 DIAGNOSIS — R059 Cough, unspecified: Secondary | ICD-10-CM | POA: Diagnosis not present

## 2023-07-15 DIAGNOSIS — E43 Unspecified severe protein-calorie malnutrition: Secondary | ICD-10-CM | POA: Diagnosis not present

## 2023-07-16 DIAGNOSIS — R1310 Dysphagia, unspecified: Secondary | ICD-10-CM | POA: Diagnosis not present

## 2023-07-16 DIAGNOSIS — R0902 Hypoxemia: Secondary | ICD-10-CM | POA: Diagnosis not present

## 2023-07-16 DIAGNOSIS — R5383 Other fatigue: Secondary | ICD-10-CM | POA: Diagnosis not present

## 2023-07-16 DIAGNOSIS — R000 Tachycardia, unspecified: Secondary | ICD-10-CM | POA: Diagnosis not present

## 2023-07-16 DIAGNOSIS — R509 Fever, unspecified: Secondary | ICD-10-CM | POA: Diagnosis not present

## 2023-07-16 DIAGNOSIS — Z515 Encounter for palliative care: Secondary | ICD-10-CM | POA: Diagnosis not present

## 2023-07-16 DIAGNOSIS — R569 Unspecified convulsions: Secondary | ICD-10-CM | POA: Diagnosis not present

## 2023-07-18 DIAGNOSIS — R509 Fever, unspecified: Secondary | ICD-10-CM | POA: Diagnosis not present

## 2023-07-18 DIAGNOSIS — R5383 Other fatigue: Secondary | ICD-10-CM | POA: Diagnosis not present

## 2023-07-18 DIAGNOSIS — R569 Unspecified convulsions: Secondary | ICD-10-CM | POA: Diagnosis not present

## 2023-07-18 DIAGNOSIS — R000 Tachycardia, unspecified: Secondary | ICD-10-CM | POA: Diagnosis not present

## 2023-07-18 DIAGNOSIS — R1310 Dysphagia, unspecified: Secondary | ICD-10-CM | POA: Diagnosis not present

## 2023-07-18 DIAGNOSIS — Z515 Encounter for palliative care: Secondary | ICD-10-CM | POA: Diagnosis not present

## 2023-07-18 DIAGNOSIS — R0902 Hypoxemia: Secondary | ICD-10-CM | POA: Diagnosis not present

## 2023-07-20 DIAGNOSIS — R5383 Other fatigue: Secondary | ICD-10-CM | POA: Diagnosis not present

## 2023-07-20 DIAGNOSIS — R000 Tachycardia, unspecified: Secondary | ICD-10-CM | POA: Diagnosis not present

## 2023-07-20 DIAGNOSIS — R0902 Hypoxemia: Secondary | ICD-10-CM | POA: Diagnosis not present

## 2023-07-20 DIAGNOSIS — R569 Unspecified convulsions: Secondary | ICD-10-CM | POA: Diagnosis not present

## 2023-07-20 DIAGNOSIS — R509 Fever, unspecified: Secondary | ICD-10-CM | POA: Diagnosis not present

## 2023-07-21 ENCOUNTER — Inpatient Hospital Stay: Payer: Self-pay | Admitting: Neurology

## 2023-08-21 DEATH — deceased
